# Patient Record
Sex: Female | Born: 1970 | Race: Black or African American | Hispanic: No | Marital: Married | Smoking: Never smoker
Health system: Southern US, Community
[De-identification: ages and names within clinical notes are randomized; demographics above are authoritative.]

## PROBLEM LIST (undated history)

## (undated) DIAGNOSIS — R0989 Other specified symptoms and signs involving the circulatory and respiratory systems: Secondary | ICD-10-CM

## (undated) DIAGNOSIS — J45909 Unspecified asthma, uncomplicated: Secondary | ICD-10-CM

## (undated) DIAGNOSIS — Z803 Family history of malignant neoplasm of breast: Secondary | ICD-10-CM

## (undated) DIAGNOSIS — I639 Cerebral infarction, unspecified: Secondary | ICD-10-CM

## (undated) DIAGNOSIS — R0689 Other abnormalities of breathing: Secondary | ICD-10-CM

## (undated) HISTORY — DX: Unspecified asthma, uncomplicated: J45.909

## (undated) HISTORY — PX: HERNIA REPAIR: SHX51

## (undated) HISTORY — DX: Other abnormalities of breathing: R06.89

## (undated) HISTORY — DX: Other specified symptoms and signs involving the circulatory and respiratory systems: R09.89

## (undated) HISTORY — PX: COLONOSCOPY: SHX174

## (undated) HISTORY — DX: Family history of malignant neoplasm of breast: Z80.3

---

## 2004-06-17 ENCOUNTER — Emergency Department: Payer: Self-pay | Admitting: Emergency Medicine

## 2004-07-30 ENCOUNTER — Emergency Department: Payer: Self-pay | Admitting: Emergency Medicine

## 2004-12-14 ENCOUNTER — Emergency Department: Payer: Self-pay | Admitting: Emergency Medicine

## 2004-12-31 ENCOUNTER — Emergency Department: Payer: Self-pay | Admitting: Unknown Physician Specialty

## 2005-01-23 ENCOUNTER — Emergency Department: Payer: Self-pay | Admitting: Emergency Medicine

## 2005-02-13 ENCOUNTER — Emergency Department: Payer: Self-pay | Admitting: Emergency Medicine

## 2005-02-15 ENCOUNTER — Ambulatory Visit: Payer: Self-pay | Admitting: Obstetrics & Gynecology

## 2005-03-02 ENCOUNTER — Ambulatory Visit: Payer: Self-pay | Admitting: Obstetrics & Gynecology

## 2005-04-27 ENCOUNTER — Emergency Department: Payer: Self-pay | Admitting: Emergency Medicine

## 2005-04-29 ENCOUNTER — Emergency Department: Payer: Self-pay | Admitting: Internal Medicine

## 2005-10-21 ENCOUNTER — Other Ambulatory Visit: Payer: Self-pay

## 2005-10-21 ENCOUNTER — Ambulatory Visit: Payer: Self-pay | Admitting: Specialist

## 2006-02-26 ENCOUNTER — Emergency Department: Payer: Self-pay | Admitting: Emergency Medicine

## 2006-07-28 ENCOUNTER — Ambulatory Visit: Payer: Self-pay | Admitting: Obstetrics & Gynecology

## 2006-08-17 ENCOUNTER — Ambulatory Visit: Payer: Self-pay | Admitting: Obstetrics & Gynecology

## 2008-11-21 ENCOUNTER — Ambulatory Visit: Payer: Self-pay

## 2008-12-13 ENCOUNTER — Ambulatory Visit: Payer: Self-pay | Admitting: Unknown Physician Specialty

## 2010-09-30 ENCOUNTER — Inpatient Hospital Stay: Payer: Self-pay | Admitting: Internal Medicine

## 2011-03-05 ENCOUNTER — Ambulatory Visit: Payer: Self-pay | Admitting: Specialist

## 2011-08-11 HISTORY — PX: FOOT SURGERY: SHX648

## 2011-08-23 ENCOUNTER — Inpatient Hospital Stay: Payer: Self-pay | Admitting: Internal Medicine

## 2011-08-23 LAB — CBC
HCT: 38.5 % (ref 35.0–47.0)
HGB: 13 g/dL (ref 12.0–16.0)
MCH: 29.8 pg (ref 26.0–34.0)
MCHC: 33.8 g/dL (ref 32.0–36.0)
MCV: 88 fL (ref 80–100)
Platelet: 305 10*3/uL (ref 150–440)
RBC: 4.37 10*6/uL (ref 3.80–5.20)
RDW: 13.9 % (ref 11.5–14.5)
WBC: 23.5 10*3/uL — ABNORMAL HIGH (ref 3.6–11.0)

## 2011-08-23 LAB — COMPREHENSIVE METABOLIC PANEL
Alkaline Phosphatase: 76 U/L (ref 50–136)
BUN: 10 mg/dL (ref 7–18)
Bilirubin,Total: 0.9 mg/dL (ref 0.2–1.0)
Calcium, Total: 8.5 mg/dL (ref 8.5–10.1)
Co2: 27 mmol/L (ref 21–32)
Creatinine: 0.82 mg/dL (ref 0.60–1.30)
EGFR (Non-African Amer.): >60
Potassium: 4 mmol/L (ref 3.5–5.1)
SGOT(AST): 24 U/L (ref 15–37)
SGPT (ALT): 25 U/L

## 2011-08-23 LAB — RAPID INFLUENZA A&B ANTIGENS

## 2011-08-23 LAB — TROPONIN I: Troponin-I: <0.02 ng/mL

## 2011-08-23 LAB — CK TOTAL AND CKMB (NOT AT ARMC): CK-MB: <0.5 ng/mL — ABNORMAL LOW (ref 0.5–3.6)

## 2011-08-24 LAB — BASIC METABOLIC PANEL
Anion Gap: 11 (ref 7–16)
BUN: 9 mg/dL (ref 7–18)
Calcium, Total: 8.9 mg/dL (ref 8.5–10.1)
EGFR (African American): >60
EGFR (Non-African Amer.): >60
Glucose: 122 mg/dL — ABNORMAL HIGH (ref 65–99)
Osmolality: 278 (ref 275–301)

## 2011-08-24 LAB — CBC WITH DIFFERENTIAL/PLATELET
Basophil #: 0 10*3/uL (ref 0.0–0.1)
Eosinophil #: 0 10*3/uL (ref 0.0–0.7)
HCT: 36.7 % (ref 35.0–47.0)
HGB: 12.2 g/dL (ref 12.0–16.0)
Lymphocyte #: 1.3 10*3/uL (ref 1.0–3.6)
MCH: 29.8 pg (ref 26.0–34.0)
MCHC: 33.1 g/dL (ref 32.0–36.0)
MCV: 90 fL (ref 80–100)
Monocyte #: 0.2 10*3/uL (ref 0.0–0.7)
Neutrophil #: 19.6 10*3/uL — ABNORMAL HIGH (ref 1.4–6.5)
RDW: 13.9 % (ref 11.5–14.5)

## 2011-08-24 LAB — TSH: Thyroid Stimulating Horm: 0.372 u[IU]/mL — ABNORMAL LOW

## 2011-08-29 LAB — CULTURE, BLOOD (SINGLE)

## 2012-09-13 ENCOUNTER — Inpatient Hospital Stay: Payer: Self-pay | Admitting: Internal Medicine

## 2012-09-13 LAB — CBC WITH DIFFERENTIAL/PLATELET
Basophil #: 0 10*3/uL (ref 0.0–0.1)
Eosinophil #: 0 10*3/uL (ref 0.0–0.7)
HCT: 39.9 % (ref 35.0–47.0)
HGB: 13.5 g/dL (ref 12.0–16.0)
Lymphocyte #: 0.2 10*3/uL — ABNORMAL LOW (ref 1.0–3.6)
Lymphocyte %: 1.9 %
MCH: 29.7 pg (ref 26.0–34.0)
MCHC: 33.8 g/dL (ref 32.0–36.0)
MCV: 88 fL (ref 80–100)
Neutrophil #: 11.9 10*3/uL — ABNORMAL HIGH (ref 1.4–6.5)
Neutrophil %: 96 %
Platelet: 378 10*3/uL (ref 150–440)
WBC: 12.4 10*3/uL — ABNORMAL HIGH (ref 3.6–11.0)

## 2012-09-13 LAB — COMPREHENSIVE METABOLIC PANEL
BUN: 7 mg/dL (ref 7–18)
Co2: 27 mmol/L (ref 21–32)
EGFR (African American): >60
Glucose: 139 mg/dL — ABNORMAL HIGH (ref 65–99)
Osmolality: 274 (ref 275–301)
Total Protein: 7.9 g/dL (ref 6.4–8.2)

## 2012-09-14 LAB — CBC WITH DIFFERENTIAL/PLATELET
Basophil #: 0 10*3/uL (ref 0.0–0.1)
Basophil %: 0 %
Eosinophil #: 0 10*3/uL (ref 0.0–0.7)
Eosinophil %: 0 %
HCT: 38.8 % (ref 35.0–47.0)
HGB: 13 g/dL (ref 12.0–16.0)
Lymphocyte #: 1.4 10*3/uL (ref 1.0–3.6)
MCV: 88 fL (ref 80–100)
Monocyte %: 6.2 %
Neutrophil #: 10.6 10*3/uL — ABNORMAL HIGH (ref 1.4–6.5)
Neutrophil %: 83.1 %
RDW: 13.9 % (ref 11.5–14.5)
WBC: 12.7 10*3/uL — ABNORMAL HIGH (ref 3.6–11.0)

## 2012-09-14 LAB — BASIC METABOLIC PANEL
Anion Gap: 6 — ABNORMAL LOW (ref 7–16)
Chloride: 105 mmol/L (ref 98–107)
Co2: 27 mmol/L (ref 21–32)
Creatinine: 0.76 mg/dL (ref 0.60–1.30)
EGFR (African American): >60
EGFR (Non-African Amer.): >60
Glucose: 103 mg/dL — ABNORMAL HIGH (ref 65–99)
Osmolality: 275 (ref 275–301)
Sodium: 138 mmol/L (ref 136–145)

## 2013-06-23 ENCOUNTER — Encounter: Payer: Self-pay | Admitting: Podiatry

## 2013-07-26 ENCOUNTER — Telehealth: Payer: Self-pay | Admitting: *Deleted

## 2013-07-26 NOTE — Telephone Encounter (Signed)
Called and left [name] a message to call us back regarding her surgery day, per dr Charlsie Merles he asked if we could reschedule her date from 12.30.14 to 12.31.14

## 2013-07-28 ENCOUNTER — Encounter: Payer: Self-pay | Admitting: Podiatry

## 2013-07-28 ENCOUNTER — Ambulatory Visit (INDEPENDENT_AMBULATORY_CARE_PROVIDER_SITE_OTHER): Payer: BC Managed Care – PPO | Admitting: Podiatry

## 2013-07-28 VITALS — BP 118/74 | HR 80 | Resp 16

## 2013-07-28 DIAGNOSIS — M2010 Hallux valgus (acquired), unspecified foot: Secondary | ICD-10-CM

## 2013-07-28 DIAGNOSIS — M2040 Other hammer toe(s) (acquired), unspecified foot: Secondary | ICD-10-CM

## 2013-07-28 NOTE — Progress Notes (Signed)
Subjective:     Patient ID: [name], female   DOB: 1971-07-14, 42 y.o.   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 Scheduled for surgery the end of this month

## 2013-07-28 NOTE — Progress Notes (Signed)
   Subjective:    Patient ID: [name], female    DOB: 1971-05-18, 42 y.o.   MRN: [id number]  HPI Comments: i have some questions berfore surgery      Review of Systems     Objective:   Physical Exam        Assessment & Plan:

## 2013-08-09 ENCOUNTER — Encounter: Payer: Self-pay | Admitting: Podiatry

## 2013-08-09 DIAGNOSIS — M2040 Other hammer toe(s) (acquired), unspecified foot: Secondary | ICD-10-CM

## 2013-08-09 DIAGNOSIS — M2030 Hallux varus (acquired), unspecified foot: Secondary | ICD-10-CM

## 2013-08-09 DIAGNOSIS — M2010 Hallux valgus (acquired), unspecified foot: Secondary | ICD-10-CM

## 2013-08-11 ENCOUNTER — Telehealth: Payer: Self-pay | Admitting: *Deleted

## 2013-08-11 MED ORDER — ONDANSETRON HCL 8 MG PO TABS: 8.0000 mg | ORAL_TABLET | Freq: Four times a day (QID) | ORAL | Status: DC | PRN

## 2013-08-11 NOTE — Telephone Encounter (Signed)
PT CALLED SAID SHE IS TAKING PERCOCET ONLY AND EVERY TIME SHE TAKES IT  WITH FOOD WITHIN  15-20 MINUTES IT WILL  MAKE HER VOMIT. PT STATED THAT SHE TOLD DR. REGAL SHE PREFERRED VICODIN BUT HE SAID THE PERCOCET WILL WORK BETTER. DO YOU WANT TO GIVE HER SOMETHING FOR NAUSEA TO GO ALONG WITH PERCOCET OR CHANGE RX TO VICODIN? PTS SURGERY WAS 12.31.14 [place] BUNIONECTOMY LEFT. ALLERGIC TO ADVIL.

## 2013-08-11 NOTE — Telephone Encounter (Signed)
Spoke with pt said she is taking 1/2 tablet of percocet for pain every 2-3 hours for pain. Stated that when she eats chicken broth soup and drinks ginger ale she can keep it down with out vomiting. Dr. Valentina Lucks said she could have vicodin and stop percocet and have zofran. Told pt vicodin could not be called in would have to pick it up. Said since she can keep soup down she will take the zofran and if she had to she would pick up rx for vicodin on Monday. Told pt to elevate, stay off of foot, ice and try to take percocet every 4hrs at the least. Pt states ok.

## 2013-08-15 ENCOUNTER — Ambulatory Visit (INDEPENDENT_AMBULATORY_CARE_PROVIDER_SITE_OTHER): Payer: BC Managed Care – PPO | Admitting: Podiatry

## 2013-08-15 ENCOUNTER — Ambulatory Visit (INDEPENDENT_AMBULATORY_CARE_PROVIDER_SITE_OTHER): Payer: BC Managed Care – PPO

## 2013-08-15 ENCOUNTER — Encounter: Payer: BC Managed Care – PPO | Admitting: Podiatry

## 2013-08-15 ENCOUNTER — Encounter: Payer: Self-pay | Admitting: Podiatry

## 2013-08-15 VITALS — BP 103/70 | HR 83 | Resp 16 | Ht 62.0 in | Wt 260.0 lb

## 2013-08-15 DIAGNOSIS — Z9889 Other specified postprocedural states: Secondary | ICD-10-CM

## 2013-08-15 DIAGNOSIS — R609 Edema, unspecified: Secondary | ICD-10-CM

## 2013-08-15 DIAGNOSIS — M2010 Hallux valgus (acquired), unspecified foot: Secondary | ICD-10-CM

## 2013-08-15 NOTE — Progress Notes (Signed)
Subjective:     Patient ID: [name], female   DOB: 07/13/1971, 43 y.o.   MRN: [id number]  HPI patient states that she is doing well on her left foot 7 days post surgery and is walking with minimal discomfort   Review of Systems     Objective:   Physical Exam Neurovascular status intact with well-healing surgical sites and good structural alignment of the first MPJ. No indications of drainage or erythema with mild edema consistent with this. Postop and negative Homans sign noted    Assessment:     Doing well post forefoot reconstruction left    Plan:     H&P and x-rays reviewed. Reapplied sterile dressing and instructed on continued boot usage with gradual surgical shoe usage at home reappoint 2 weeks for suture removal and less she needs to be seen earlier for any reason

## 2013-08-29 ENCOUNTER — Ambulatory Visit (INDEPENDENT_AMBULATORY_CARE_PROVIDER_SITE_OTHER): Payer: BC Managed Care – PPO | Admitting: Podiatry

## 2013-08-29 ENCOUNTER — Ambulatory Visit (INDEPENDENT_AMBULATORY_CARE_PROVIDER_SITE_OTHER): Payer: BC Managed Care – PPO

## 2013-08-29 ENCOUNTER — Encounter: Payer: Self-pay | Admitting: Podiatry

## 2013-08-29 VITALS — BP 97/60 | HR 82 | Resp 16 | Ht 61.0 in | Wt 260.0 lb

## 2013-08-29 DIAGNOSIS — M2010 Hallux valgus (acquired), unspecified foot: Secondary | ICD-10-CM

## 2013-08-29 DIAGNOSIS — Z9889 Other specified postprocedural states: Secondary | ICD-10-CM

## 2013-08-29 DIAGNOSIS — M2040 Other hammer toe(s) (acquired), unspecified foot: Secondary | ICD-10-CM

## 2013-08-29 NOTE — Progress Notes (Signed)
Subjective:     Patient ID: [name], female   DOB: 07-19-1971, 43 y.o.   MRN: [id number]  HPI patient states the corn on her fourth toe is bothering her but overall her feet are doing pretty good after surgery. She is one month after having extensive forefoot surgery left   Review of Systems     Objective:   Physical Exam Neurovascular status intact with well-healing surgical sites of the left forefoot first MPJ hallux and fourth toe with keratotic lesion fourth toe and underneath the left foot    Assessment:     Patient is progressing well and satisfied with progress we have at this time    Plan:     At this time debrided lesion and reviewed x-rays. Dispensed anklet with instructions on usage and affixed to tight on her lower ankle she will remove it and begin wearing tennis shoes with improvement of the first MPJ range of motion and port and for her to work on. Reappoint 4 weeks

## 2013-09-15 NOTE — Progress Notes (Signed)
1. [place] bunionectomy (cutting and moving bone) left 2. [place] osteotomy with wire (possible) left 3. Hammer toe repair with removal bone 4th left  Rx: Percocet 10/325 #50 0 refills one to two by mouth every 4 - 6 hrs as needed for pain

## 2013-09-26 ENCOUNTER — Ambulatory Visit (INDEPENDENT_AMBULATORY_CARE_PROVIDER_SITE_OTHER): Payer: BC Managed Care – PPO

## 2013-09-26 ENCOUNTER — Ambulatory Visit (INDEPENDENT_AMBULATORY_CARE_PROVIDER_SITE_OTHER): Payer: BC Managed Care – PPO | Admitting: Podiatry

## 2013-09-26 VITALS — BP 112/62 | HR 85 | Resp 16 | Ht 61.0 in | Wt 264.0 lb

## 2013-09-26 DIAGNOSIS — Z9889 Other specified postprocedural states: Secondary | ICD-10-CM

## 2013-09-26 DIAGNOSIS — M2010 Hallux valgus (acquired), unspecified foot: Secondary | ICD-10-CM

## 2013-09-26 DIAGNOSIS — M2040 Other hammer toe(s) (acquired), unspecified foot: Secondary | ICD-10-CM

## 2013-09-26 MED ORDER — HYDROCODONE-ACETAMINOPHEN 10-325 MG PO TABS: 1.0000 | ORAL_TABLET | Freq: Three times a day (TID) | ORAL | Status: DC | PRN

## 2013-09-26 NOTE — Progress Notes (Signed)
Subjective:     Patient ID: [name], female   DOB: 1971/03/25, 43 y.o.   MRN: [id number]  HPI patient states I'm doing pretty well but I still gets some swelling at the end of the day and I still gets some pain in my foot at night   Review of Systems     Objective:   Physical Exam Neurovascular status intact with patient well oriented x3 and well-healing surgical sites left foot with mild edema noted consistent with the. She is postop    Assessment:     Doing well but still having mild persistent swelling which is normal for the amount of work she had gone    Plan:     Explained elevation and compression with gradual return to normal shoes and at this time patient is allowed to return to work in the next several weeks and will be seen back by me in 8 weeks to recheck earlier if any issues should occur

## 2013-10-06 ENCOUNTER — Encounter: Payer: Self-pay | Admitting: Podiatry

## 2013-11-28 ENCOUNTER — Ambulatory Visit (INDEPENDENT_AMBULATORY_CARE_PROVIDER_SITE_OTHER): Payer: BC Managed Care – PPO

## 2013-11-28 ENCOUNTER — Encounter: Payer: Self-pay | Admitting: Podiatry

## 2013-11-28 ENCOUNTER — Ambulatory Visit (INDEPENDENT_AMBULATORY_CARE_PROVIDER_SITE_OTHER): Payer: BC Managed Care – PPO | Admitting: Podiatry

## 2013-11-28 VITALS — BP 110/60 | HR 80 | Resp 12

## 2013-11-28 DIAGNOSIS — Z9889 Other specified postprocedural states: Secondary | ICD-10-CM

## 2013-11-28 DIAGNOSIS — M2010 Hallux valgus (acquired), unspecified foot: Secondary | ICD-10-CM

## 2013-11-28 NOTE — Progress Notes (Signed)
Subjective:     Patient ID: [name], female   DOB: 30-Jun-1971, 43 y.o.   MRN: [id number]  HPI patient states she is doing well with some occasional discomfort at the end of the day and swelling which can occur at the end of the day   Review of Systems     Objective:   Physical Exam  Neurovascular status intact negative Homan sign was noted with well-healing surgical site first metatarsal left with good alignment of the hallux and first metatarsal    Assessment:     Doing well from foot surgery left with normal amount of edema at this point    Plan:     Reviewed x-rays and advised the swelling should gradually get better but may take another 2-4 months to completely go away. Reappoint to recheck

## 2014-01-15 DIAGNOSIS — J45909 Unspecified asthma, uncomplicated: Secondary | ICD-10-CM | POA: Insufficient documentation

## 2014-09-11 ENCOUNTER — Emergency Department: Payer: Self-pay | Admitting: Emergency Medicine

## 2014-09-11 LAB — CBC WITH DIFFERENTIAL/PLATELET
BASOS ABS: 0.1 10*3/uL (ref 0.0–0.1)
Basophil %: 0.3 %
Eosinophil #: 0.1 10*3/uL (ref 0.0–0.7)
Eosinophil %: 0.5 %
HCT: 40.7 % (ref 35.0–47.0)
HGB: 13.5 g/dL (ref 12.0–16.0)
LYMPHS ABS: 0.6 10*3/uL — AB (ref 1.0–3.6)
LYMPHS PCT: 3.3 %
MCH: 29.1 pg (ref 26.0–34.0)
MCHC: 33.2 g/dL (ref 32.0–36.0)
MCV: 88 fL (ref 80–100)
MONOS PCT: 3 %
Monocyte #: 0.5 x10 3/mm (ref 0.2–0.9)
Neutrophil #: 16.4 10*3/uL — ABNORMAL HIGH (ref 1.4–6.5)
Neutrophil %: 92.9 %
Platelet: 328 10*3/uL (ref 150–440)
RBC: 4.63 10*6/uL (ref 3.80–5.20)
RDW: 14.6 % — ABNORMAL HIGH (ref 11.5–14.5)
WBC: 17.7 10*3/uL — ABNORMAL HIGH (ref 3.6–11.0)

## 2014-09-11 LAB — URINALYSIS, COMPLETE
Bilirubin,UR: NEGATIVE
Glucose,UR: NEGATIVE mg/dL (ref 0–75)
Ketone: NEGATIVE
LEUKOCYTE ESTERASE: NEGATIVE
NITRITE: NEGATIVE
Ph: 9 (ref 4.5–8.0)
RBC,UR: 746 /HPF (ref 0–5)
SPECIFIC GRAVITY: 1.025 (ref 1.003–1.030)
Squamous Epithelial: 3

## 2014-09-11 LAB — COMPREHENSIVE METABOLIC PANEL
ALT: 18 U/L (ref 14–63)
ANION GAP: 8 (ref 7–16)
Albumin: 3.1 g/dL — ABNORMAL LOW (ref 3.4–5.0)
Alkaline Phosphatase: 71 U/L (ref 46–116)
BUN: 14 mg/dL (ref 7–18)
Bilirubin,Total: 0.5 mg/dL (ref 0.2–1.0)
CHLORIDE: 105 mmol/L (ref 98–107)
CREATININE: 0.82 mg/dL (ref 0.60–1.30)
Calcium, Total: 8.6 mg/dL (ref 8.5–10.1)
Co2: 27 mmol/L (ref 21–32)
EGFR (Non-African Amer.): >60
GLUCOSE: 126 mg/dL — AB (ref 65–99)
Osmolality: 281 (ref 275–301)
POTASSIUM: 3.5 mmol/L (ref 3.5–5.1)
SGOT(AST): 22 U/L (ref 15–37)
Sodium: 140 mmol/L (ref 136–145)
Total Protein: 7 g/dL (ref 6.4–8.2)

## 2014-09-11 LAB — TROPONIN I

## 2014-09-11 LAB — LIPASE, BLOOD: Lipase: 120 U/L (ref 73–393)

## 2014-11-30 NOTE — H&P (Signed)
PATIENT NAME:  [name], [name] MR#:  [id number] DATE OF BIRTH:  [birth year]  DATE OF ADMISSION:  09/13/2012  PULMONOLOGIST: Melody Haver E. Raul Del, MD   REFERRING PHYSICIAN: Angus Owens Shark, MD  CHIEF COMPLAINT: Shortness of breath.   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 The patient is reporting that she has been coughing and bringing up yellowish phlegm. She was seen by her pulmonologist, Dr. Raul Del, prior to Friday, and she was started on the prednisone, and prescription was given for the levofloxacin as she responds well to levofloxacin. Unfortunately, as her insurance is not covering levofloxacin, that antibiotic was changed to amoxicillin by Dr. Raul Del. The patient did not improve at all clinically, and her shortness of breath with productive cough has been getting worse. As she was feeling much sicker, she came to the ER. In the ER, chest x-ray has revealed no acute infiltrates. ABG has revealed pH of 7.45 with pCO2 37 and pO2 59. The patient has received Solu-Medrol 125 mg IV and levofloxacin. Urine pregnancy test was negative. She was given some breathing treatments, and eventually during my examination, the patient started feeling much better. She denies any sick contacts, contact with patients with flu. Denies any chest pain. Denies any other complaints. Hospitalist team is called to admit the patient.   PAST MEDICAL HISTORY: Chronic history of asthma, seasonal allergies, peptic ulcer disease, history of left lower extremity DVT, chronic headaches, sleep apnea.   PAST SURGICAL HISTORY: Hernia repair. The patient denies any thyroid surgery in the past, but  that was documented in January 2013 medical records. The patient is reporting that she never had any thyroid problems.   ALLERGIES: SHE IS ALLERGIC TO ADVIL, ASPIRIN, IBUPROFEN, MOTRIN.   HOME MEDICATIONS: Symbicort 2 inhalations twice a day, Singulair 10 mg once daily, prednisone tampering dose, levalbuterol 0.5 mg inhalation every 6 hours, amoxicillin 250 mg 1 capsule 3 times a day, Advair HFA inhalations twice a day.   PSYCHOSOCIAL HISTORY: Lives at home with her 43 year old daughter. Denies any history of smoking, alcohol or illicit drug usage.   FAMILY HISTORY: Her mother has history of diabetes mellitus.   REVIEW OF SYSTEMS:  CONSTITUTIONAL: Complaining of fatigue, but denies any weight loss or weight gain.  EYES: Denies blurry vision, glaucoma, cataracts.  ENT: Denies epistaxis, nasal discharge or difficulty in swallowing.  RESPIRATORY: Complaining of productive cough. Chronic history of asthma. Complaining of dyspnea. Denies any pneumonia, painful respirations.  CARDIOVASCULAR: Denies chest pain, palpitations, syncope. GASTROINTESTINAL: Denies nausea, vomiting, diarrhea, abdominal pain, GERD, but the patient has history of peptic ulcer disease.  GENITOURINARY: Denies dysuria, hematuria. GYNECOLOGIC AND BREASTS: Denies breast mass, vaginal discharge.  ENDOCRINE: Denies polyuria, polyphagia, polydipsia. Denies any thyroid problems. HEMATOLOGIC AND LYMPHATIC: No history of anemia or bleeding.  INTEGUMENTARY: Denies acne, rash, lesions.  MUSCULOSKELETAL: Denies any neck pain, back pain, shoulder pain, redness, gout.  NEUROLOGIC: No vertigo, ataxia, dementia, CVA, TIA.  PSYCH: Denies any insomnia, ADD, OCD.   PHYSICAL EXAMINATION:  VITAL SIGNS: Pulse 82, respirations 22, blood pressure 124/64, satting 96%. Temperature is not recorded.  GENERAL APPEARANCE: Not under acute distress. Obese, moderate built.  HEENT: Normocephalic, atraumatic. Pupils are  equally reacting to light and  accommodation. No conjunctival injection. No scleral icterus. Positive nasal discharge, positive postnasal drip. No sinus tenderness.  NECK: Supple. No JVD. No thyromegaly.  LUNGS: Moderate air entry. End-expiratory wheezing. No accessory muscle usage. No crackles. No rhonchi.  CARDIAC: S1, S2 normal. Regular rate and rhythm. No murmurs.  GASTROINTESTINAL: Soft, obese. Bowel sounds are positive in all 4 quadrants. Nontender, nondistended. No masses.  NEUROLOGIC: Awake, alert and oriented x3. Motor and sensory are grossly intact. Reflexes are 2+.  SKIN: Warm to touch, dry. No lesions. No rashes.  EXTREMITIES: No edema. No cyanosis. No clubbing. PSYCHIATRIC: Normal mood and affect.  MUSCULOSKELETAL: No joint effusion. No tenderness. No erythema.   DIAGNOSTIC STUDIES: Chest x-ray: No acute findings. CBC and CMP are ordered, which are pending. Urine pregnancy test is pending at this time as well. ABG has revealed pH of 7.45, pCO2 37, pO2 59 on 21% FiO2, bicarb is 25.3, pulse oximetry 92.4%. The patient's temperature is 37 degrees Fahrenheit on ABG.   ASSESSMENT AND PLAN: A 44 year old African-American female presenting to the Emergency Room with a chief complaint of shortness of breath and productive cough for the past 3 to 4 days and failed on outpatient antibiotic, amoxicillin. Will be admitted with the following assessment and plan:   1. Shortness of breath from acute asthma exacerbation and acute bronchitis.  Plan: Will admit her to inpatient status.  Solu-Medrol 125 mg IV x1 was given in the ER. Will continue 60 mg IV q.6 hours.  DuoNeb treatments q.6 hours.  Sputum culture and sensitivities ordered.  Levofloxacin 750 mg IV q.24 hours.  Followup on the labs which are still pending.  2. History of sleep apnea. The patient is not on any CPAP machine.  3. History of peptic ulcer disease. Will provide her GI prophylaxis with ranitidine 150 mg twice a day.  4. Will continue Singulair.  5.  History of lower extremity deep vein thrombosis. The patient denies any calf tenderness at this time and reported that she has finished the course of her treatment for deep vein thrombosis.  6. CODE STATUS: She is full code. 7. She is ambulatory, so she does not need any deep vein thrombosis prophylaxis at this time.   TOTAL TIME SPENT ON ADMISSION: 45 minutes.  ____________________________ Nicholes Mango, MD ag:OSi D: 09/13/2012 03:55:32 ET T: 09/13/2012 11:40:57 ET JOB#: [id number]  cc: Nicholes Mango, MD, <Dictator> Herbon E. Raul Del, MD Nicholes Mango MD ELECTRONICALLY SIGNED 09/14/2012 4:55

## 2014-11-30 NOTE — Discharge Summary (Signed)
PATIENT NAME:  [name], SPIEGELMAN MR#:  [id number] DATE OF BIRTH:  [birth year]  DATE OF ADMISSION:  09/13/2012 DATE OF DISCHARGE:  09/14/2012  ADMITTING DIAGNOSIS:  Shortness of breath.   DISCHARGE DIAGNOSES: 1.  Shortness of breath due to combination of acute asthma exacerbation as well as community-acquired pneumonia.  The patient's symptoms improved, seen by her pulmonologist, okay for discharge.  2.  Seasonal allergies.  3.  Peptic ulcer disease.  4.  History of left lower extremity deep vein thrombosis in the past, status post treatment.  5.  Chronic headaches.  6.  Sleep apnea, intolerant to CPAP machine.  7.  Status post hernia repair.   PERTINENT LABORATORY AND EVALUATIONS:  EKG on admission showed normal sinus rhythm with nonspecific ST changes.  ABG, pH 7.45, pCO2 of 37, pO2 of 59.  Her chest x-ray, PA and lateral, showed an infiltrate in the left lower lobe posteriorly, perihilar subsegmental atelectasis bilaterally, mild hyperinflation consistent with reactive airway disease.    CONSULTANTS:  Dr. Raul Del.   HOSPITAL COURSE:  Please refer to history and physical done by the admitting physician.  The patient is a 44 year old African American female with history of asthma, mild obstructive sleep apnea, unable to tolerate CPAP, seasonal allergies, peptic ulcer disease, history of lower extremity DVT who presented with complaint of shortness of breath for the past 3 to 4 days.  The patient was seen by her pulmonologist, Dr. Raul Del on Friday, started on prednisone and given prescription for levofloxacin, however insurance did not cover levofloxacin.  She was changed to amoxicillin.  She continued to have shortness of breath and felt much sicker so came to the ED.  In the ER she was noted to have a PaO2 of 59.  The patient was given IV Solu-Medrol and nebulizer treatments and IV antibiotics.  She was continued on these treatments.  She is feeling much better.  She was seen by Dr. Raul Del who  stated that she is stable for discharge.  At this point, she is stable for discharge.   DISCHARGE MEDICATIONS:  Singulair 10 daily, Tussionex 5 mL q. 12, levalbuterol q. 4 as needed, Advair inhalation 250/50 INH q. 12, Symbicort inhalation as doing before, prednisone taper 60 mg, taper by 10 mg until complete, Ceftin 500 by mouth twice daily x 5 days, azithromycin 500 mg 1 tab by mouth daily x 5 days.   HOME OXYGEN:  None.   DIET:  Regular.   ACTIVITY:  As tolerated.   DISCHARGE FOLLOW-UP:  Follow with Dr. Raul Del in 1 to 2 weeks.   TIME SPENT:  35 minutes spent on the discharge.      ____________________________ Lafonda Mosses. Posey Pronto, MD shp:ea D: 09/14/2012 15:19:26 ET T: 09/14/2012 23:12:09 ET JOB#: [id number]  cc: Aziza Stuckert H. Posey Pronto, MD, <Dictator> Alric Seton MD ELECTRONICALLY SIGNED 09/20/2012 10:14

## 2014-12-02 NOTE — Consult Note (Signed)
PATIENT NAME:  [name], [name] MR#:  [id number] DATE OF BIRTH:  1970/10/23  DATE OF CONSULTATION:  08/24/2011  REFERRING PHYSICIAN:  Fulton Reek, MD CONSULTING PHYSICIAN:  Channon Brougher E. Raul Del, MD   CHIEF COMPLAINT: Impending respiratory failure/asthma exacerbation.   HISTORY OF PRESENT ILLNESS:  [name] is a 44 year old African American lady who is well known to me. We have been seeing her for asthma. In fact, within the last week or two she was having a bout of asthmatic flare.  She did not come into the office but we did order her a six-day prednisone tapering dose, also encouraged her to stay on her usual medications. 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 She does not smoke. She has not been exposed to obvious noxious inhalants, nor does she have any significant postnasal drip or  gastroesophageal reflux disease. She presented to the Emergency Room complaining of worsening shortness of breath associated with wheezing and cough. She was tachycardic on arrival to the Emergency Room. Temperature was 102. She was placed on oxygen, given IV steroids as well as nebulizer treatments. Did not respond and hence was admitted to the hospital. Her work-up thus far showed on initial chest x-ray now opacities in the right lower lung concerning for atelectasis although pneumonia could not be ruled out. Mild prominence of the right hilum was also seen. There were also atelectatic or infiltrative changes in the left upper lobe. Repeat chest x-ray today showed that this had all cleared up. Influenza A and B screen was negative. Blood cultures thus far are negative. Initial white count on arrival was 23.5, today is down to 21.2. She seemed to think that she was feeling a little better today, was still somewhat tight and short of breath with minimal activity. Again, no chronic pain, also has no chronic leg edema. No hemoptysis. No pleurisy.   PAST MEDICAL HISTORY:  1. Asthma.  2. History of rhinitis.   3. Gastroesophageal reflux disease.  4. Status post left lower extremity deep vein thrombosis.  5. Chronic headaches.  6. Mild sleep apnea.  7. Status post hernia repair. 8. Thyroid surgery.   ALLERGIES: Aspirin, ibuprofen.  ADMISSION MEDICATIONS: 1. Advair 500/50, 1 puff b.i.d.  2. Mucinex 600 mg b.i.d.  3. Singulair 10 mg daily.  4. Zyrtec 10 mg daily.  5. Ventolin 2 puffs every four hours p.r.n.   SOCIAL HISTORY:  She does not drink or smoke. Does have children. Here with her brother.   FAMILY HISTORY: Noted for hypertension and diabetes.   REVIEW OF SYSTEMS:  She did have a fever when she came in but that seems to have resolved. There is no associated weight loss.  Aware of the fact that she does need to lose weight. No chills, sweats, or passing out spells, nor does she have any double vision, ringing in the ears, sore throat, change in voice, nosebleeds, or significant postnasal drip. Also no neck pain, stiffness, or parotid gland enlargement. Respiratory as per above. Cardiac- no chest pain, palpitations. syncope. No nausea, vomiting, diarrhea, blood in stool, dysuria, or flank pain, polyphagia, polydipsia, heat or cold intolerance nor is she having any rashes, nonhealing ulcers, lymph nodes, bleeding from any site, halo, photosensitivity gout, joint effusions, or tenderness. No strokes, seizures, passing out spells, numbness, migraines nor is she having any suicidal ideation. No depression or anxiety. The rest of her review was noncontributory.   PHYSICAL EXAMINATION:  VITAL SIGNS: Temperature 97.6, pulse 66, respirations 17,  blood pressure 100/64, oxygen saturation 99% on room air.   GENERAL: Pleasant lady sitting in a chair. Brother is at her side. No signs of impending respiratory distress. No audible wheezing, nor is she having any use of accessory muscles.   HEENT: Normocephalic, nontraumatic. Extraocular muscles are intact. Sclerae clear. No nystagmus. Oropharynx slightly  narrowed.   NECK: Short. No jugular venous distention. No thyromegaly. Trachea midline.   LUNGS: Bilateral breath sounds somewhat distant. Minimal wheezing today.   CARDIAC: Regular rhythm. No ectopy, gallops.   ABDOMEN: Obese.   EXTREMITIES: Large legs with trace edema. No Homans sign.   SKIN: Fair turgor. No rashes. No healing ulcers.   LYMPH: No nodes in neck or supraclavicular area.   NEUROLOGIC: Did not ambulate but able to raise all extremities against gravity.   PSYCH: Appropriate affect. Alert and oriented.  LABORATORY DATA:  Today glucose 122, BUN 9, creatinine 0.69, sodium 139, potassium 3.8, chloride 104, CO2 24, calcium 11. TSH was 0.372. WBC down to 21.2, hemoglobin 12.2, hematocrit 36.5.   IMPRESSION: This is a pleasant 44 year old African American lady who is obese with mild sleep apnea. She has not been assessed recently; hence, we need to reevaluate for sleep apnea when she gets over this acute event and returns to the office. However, the more acute issue is the presentation of an asthmatic flare with atelectasis that is now resolved on today's chest x-ray. Tachycardia is now improved.  I don't have a  strong history here to suggest that she had a pulmonary embolism. She is on deep vein thrombosis prophylaxis. She has had left deep venous thrombosis in the past, however. I believe that the asthmatic flare is her primary problem. No strong history to suggest pneumonia. I do not think she had a pulmonary embolism.    RECOMMENDATIONS:  1. Continue albuterol nebulizer. 2. Steroids. 3. Deep vein thrombosis prophylaxis. 4. Increase ambulation today.  5. Anticipate possibly going home tomorrow. 6. We will check an Ig and eosinophil count to make sure she is not developing allergic bronchopulmonary aspergillosis since she has been having recurrent flares of asthma recently.      We will reassess today. I will follow with you.    ____________________________ Freda Munro.  Raul Del, MD hef:bjt D: 08/24/2011 11:32:15 ET T: 08/24/2011 12:30:19 ET JOB#: [id number]  cc: Julea Hutto E. Raul Del, MD, <Dictator> Erby Pian MD ELECTRONICALLY SIGNED 08/25/2011 13:22

## 2014-12-02 NOTE — Discharge Summary (Signed)
PATIENT NAME:  [name], [name] MR#:  [id number] DATE OF BIRTH:  [birth year]  DATE OF ADMISSION:  08/23/2011 DATE OF DISCHARGE:  08/25/2011  DISCHARGE DIAGNOSES:  1. Acute respiratory failure due to acute asthma exacerbation.  2. Acute bronchitis, tachycardia, and allergy.  3. History of left lower extremity deep vein thrombosis.  4. Leukocytosis.  5. History of peptic ulcer disease.  6. Chronic headaches.  7. Mild sleep apnea.  8. History of prior thyroid surgery.   DISPOSITION: The patient is being discharged home.   FOLLOWUP: Follow up with Dr. Raul Del in one week after discharge.   DIET: Low sodium 1800 calorie ADA diet.   ACTIVITY: As tolerated.   DISCHARGE MEDICATIONS:  1. Advair 500/50, 1 puff b.i.d.  2. Levaquin 500 mg daily for 5 days.  3. Singulair 10 mg daily.  4. Cetirizine  10 mg at bedtime.  5. Tussionex 5 mL b.i.d. p.r.n. cough. 6. Xopenex/ipratropium nebulizers every 4 hours p.r.n.    7. Prednisone taper as prescribed.   LABORATORY, DIAGNOSTIC AND RADIOLOGICAL DATA:  Initial chest x-ray showed mild opacities in the right lower lung, atelectasis versus infection. Findings most consistent with partial atelectasis of the left upper lobe. Repeat chest x-ray done the next day showed no acute disease of the chest.  Microbiology: Blood cultures showed no growth in 36 hours.  Normal hemoglobin and platelet count, white count 22.5 to 21.2.  Normal complete metabolic panel.  Normal cardiac enzymes.   HOSPITAL COURSE: The patient is a 44 year old female with past medical history of asthma, mild, sleep apnea, obesity, who presented with cough and congestion. She was found to have acute respiratory failure due to acute asthma exacerbation. She was started on nebulizer treatments, Advair, empiric antibiotics, and steroids with significant improvement. She also complained of cough and expectoration. An initial chest x-ray showed some abnormalities, but a repeat chest x-ray showed  no abnormalities. She was treated with empiric Levaquin with improvement in her symptoms. Influenza A and B were negative. Her blood cultures have been negative so far. She also had some tachycardia initially which possibly was due to acute respiratory distress, which resolved. She had leukocytosis, however, she had taken recent steroids as an outpatient. A Pulmonary consultation during the hospitalization was obtained with Dr. Raul Del, who is going to repeat her testing for sleep apnea as an outpatient.   The patient is being discharged home in a stable condition. She will follow up with Dr. Raul Del in one week after discharge.   TIME SPENT: 45 minutes   ____________________________ Cherre Huger, MD sp:cbb D: 08/25/2011 16:33:13 ET T: 08/26/2011 10:16:38 ET JOB#: [id number]  cc: Cherre Huger, MD, <Dictator> Herbon E. Raul Del, MD Cherre Huger MD ELECTRONICALLY SIGNED 08/26/2011 16:26

## 2014-12-02 NOTE — H&P (Signed)
PATIENT NAME:  [name] MR#:  [id number] DATE OF BIRTH:  [birth year]  DATE OF ADMISSION:  08/23/2011  REFERRING PHYSICIAN: Graciella Freer, MD  PRIMARY CARE PHYSICIAN: None.  PULMONOLOGIST: Wallene Huh, MD  REASON FOR ADMISSION: Acute respiratory failure with pneumonia and asthma exacerbation.   HISTORY OF PRESENT ILLNESS: The patient is a 44 year old female with a significant history of asthma and mild sleep apnea who presents to the Emergency Room with a less than one-day history of acute shortness of breath with associated wheezing and cough. In the Emergency Room, the patient was in obvious respiratory distress and was severely tachycardic. She was also febrile to 102. She was initially placed on oxygen in the Emergency Room and given IV steroids with Xopenex and Atrovent SVNs with only minimal improvement of her symptoms. She is now admitted for further evaluation.   PAST MEDICAL HISTORY:  1. Asthma.  2. Environmental allergies.  3. History of peptic ulcer disease.  4. History of left lower extremity deep vein thrombosis.  5. Chronic headaches.  6. Mild sleep apnea.  7. Status post hernia repair.  8. Previous thyroid surgery.   ADMISSION MEDICATIONS:  1. Advair 500/50 one puff twice a day. 2. Mucinex 600 mg p.o. twice a day. 3. Singulair 10 mg p.o. daily.  4. Zyrtec 10 mg p.o. daily.  5. Ventolin 2 puffs four times daily.  ALLERGIES: Aspirin and ibuprofen.   SOCIAL HISTORY: Negative for alcohol or tobacco abuse.   FAMILY HISTORY: Positive for hypertension and diabetes.   REVIEW OF SYSTEMS: CONSTITUTIONAL: Positive fever. No change in weight. EYES: No blurred or double vision. No glaucoma. ENT: No tinnitus or hearing loss. No difficulty swallowing. No nasal discharge. RESPIRATORY: No hemoptysis. No painful respiration. CARDIOVASCULAR: No chest pain or orthopnea. No syncope. GI: No nausea, vomiting, or diarrhea. No abdominal pain. No change in bowel habits. GU: No dysuria  or hematuria. No incontinence. ENDOCRINE: No polyuria or polydipsia. No heat or cold intolerance. HEMATOLOGIC: The patient denies anemia, easy bruising, or bleeding. LYMPHATIC: No swollen glands. MUSCULOSKELETAL: The patient denies pain in her neck, back, shoulders, knees, or hips. No gout. NEUROLOGIC: No numbness or migraines. Denies stroke or seizures. PSYCH: The patient denies anxiety, insomnia, or depression.   PHYSICAL EXAMINATION:   GENERAL: The patient is acutely ill-appearing, in moderate respiratory distress.   VITAL SIGNS: Vital signs are remarkable for a blood pressure of 100/60 with a heart rate of 113 and a respiratory rate of 32 with a temperature of 102.   HEENT: Normocephalic, atraumatic. Pupils are equally round and reactive to light and accommodation. Extraocular movements are intact. Sclerae anicteric. Conjunctivae are clear. Oropharynx is dry but clear.   NECK: Supple without jugular venous distention or bruits. No adenopathy or thyromegaly was noted.   LUNGS: Scattered wheezes and rhonchi bilaterally. There is dullness at the bases. No rales.   HEART: Rapid rate with a regular rhythm. Normal S1 and S2. No significant rubs, murmurs, or gallops. PMI is nondisplaced.   ABDOMEN: Soft and nontender with normoactive bowel sounds. No organomegaly or masses were appreciated. No hernias or bruits were noted.   EXTREMITIES: No clubbing, cyanosis or edema. Pulses were 2+ bilaterally.   SKIN: Warm and dry without rash or lesions.   NEUROLOGIC: Cranial nerves II through XII grossly intact. Deep tendon reflexes were symmetric. Motor and sensory exam is nonfocal.   PSYCH: Exam revealed a patient who was alert and oriented to person, place, and time. She was cooperative and  used good judgment.   LABS/STUDIES: EKG reveals sinus tachycardia with no acute ischemic changes.   Chest x-ray reveals right lower lung opacity consistent with pneumonia.   CBC was remarkable for a white count  of 23.5 with a hemoglobin of 13.0. Chemistries were unremarkable. Flu swab was negative.   ASSESSMENT:  1. Acute respiratory failure.  2. Right lower lobe pneumonia.  3. Asthma exacerbation.  4. Tachycardia.  5. Environmental allergies.  6. History of left lower extremity deep vein thrombosis.   PLAN: The patient will be admitted to the floor with telemetry with IV fluids, IV steroids, IV antibiotics, Xopenex, and Atrovent SVNs. We will continue her Singulair and Advair. We will send sputum for culture. We will supplement oxygen at this time and wean as tolerated. We will follow her heart rate closely while on telemetry. Follow-up chest x-ray in the morning. Follow           up routine labs in the morning. We will consult Dr. Raul Del in regards to her acute respiratory distress/failure. Further treatment and evaluation will depend upon the patient's progress.   TOTAL TIME SPENT: 50 minutes. ____________________________ Leonie Douglas Doy Hutching, MD jds:slb D: 08/23/2011 15:59:14 ET T: 08/23/2011 16:11:09 ET JOB#: [id number]  cc: Leonie Douglas. Doy Hutching, MD, <Dictator> Herbon E. Raul Del, MD Jazyah Butsch Lennice Sites MD ELECTRONICALLY SIGNED 08/24/2011 11:37

## 2016-05-20 ENCOUNTER — Emergency Department
Admission: EM | Admit: 2016-05-20 | Discharge: 2016-05-20 | Disposition: A | Discharge status: Home / Self Care | Payer: No Typology Code available for payment source | Attending: Emergency Medicine | Admitting: Emergency Medicine

## 2016-05-20 ENCOUNTER — Encounter: Payer: Self-pay | Admitting: Emergency Medicine

## 2016-05-20 DIAGNOSIS — Y999 Unspecified external cause status: Secondary | ICD-10-CM | POA: Insufficient documentation

## 2016-05-20 DIAGNOSIS — Y9389 Activity, other specified: Secondary | ICD-10-CM | POA: Insufficient documentation

## 2016-05-20 DIAGNOSIS — S3992XA Unspecified injury of lower back, initial encounter: Secondary | ICD-10-CM | POA: Diagnosis present

## 2016-05-20 DIAGNOSIS — S39012A Strain of muscle, fascia and tendon of lower back, initial encounter: Secondary | ICD-10-CM | POA: Insufficient documentation

## 2016-05-20 DIAGNOSIS — J45909 Unspecified asthma, uncomplicated: Secondary | ICD-10-CM | POA: Insufficient documentation

## 2016-05-20 DIAGNOSIS — Z79899 Other long term (current) drug therapy: Secondary | ICD-10-CM | POA: Insufficient documentation

## 2016-05-20 DIAGNOSIS — S239XXA Sprain of unspecified parts of thorax, initial encounter: Secondary | ICD-10-CM

## 2016-05-20 DIAGNOSIS — Y92511 Restaurant or cafe as the place of occurrence of the external cause: Secondary | ICD-10-CM | POA: Insufficient documentation

## 2016-05-20 MED ORDER — CYCLOBENZAPRINE HCL 5 MG PO TABS: 5.0000 mg | ORAL_TABLET | Freq: Three times a day (TID) | ORAL | 0 refills | Status: DC | PRN

## 2016-05-20 NOTE — Discharge Instructions (Signed)
Take the prescription muscle relaxant along with your daily Aleve for muscle pain and spasms. Follow-up with [facility] for continued symptoms.
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
## 2016-11-20 ENCOUNTER — Ambulatory Visit: Payer: Self-pay | Admitting: Obstetrics & Gynecology

## 2017-01-27 ENCOUNTER — Encounter: Payer: Self-pay | Admitting: Obstetrics & Gynecology

## 2017-01-27 ENCOUNTER — Other Ambulatory Visit: Payer: Self-pay | Admitting: Obstetrics & Gynecology

## 2017-01-27 ENCOUNTER — Ambulatory Visit (INDEPENDENT_AMBULATORY_CARE_PROVIDER_SITE_OTHER): Payer: BLUE CROSS/BLUE SHIELD | Admitting: Obstetrics & Gynecology

## 2017-01-27 VITALS — BP 120/80 | HR 75 | Ht 61.0 in | Wt 278.0 lb

## 2017-01-27 DIAGNOSIS — Z131 Encounter for screening for diabetes mellitus: Secondary | ICD-10-CM | POA: Diagnosis not present

## 2017-01-27 DIAGNOSIS — Z124 Encounter for screening for malignant neoplasm of cervix: Secondary | ICD-10-CM | POA: Diagnosis not present

## 2017-01-27 DIAGNOSIS — Z113 Encounter for screening for infections with a predominantly sexual mode of transmission: Secondary | ICD-10-CM | POA: Diagnosis not present

## 2017-01-27 DIAGNOSIS — Z8639 Personal history of other endocrine, nutritional and metabolic disease: Secondary | ICD-10-CM | POA: Diagnosis not present

## 2017-01-27 DIAGNOSIS — Z1329 Encounter for screening for other suspected endocrine disorder: Secondary | ICD-10-CM

## 2017-01-27 DIAGNOSIS — D259 Leiomyoma of uterus, unspecified: Secondary | ICD-10-CM

## 2017-01-27 DIAGNOSIS — Z01419 Encounter for gynecological examination (general) (routine) without abnormal findings: Secondary | ICD-10-CM | POA: Diagnosis not present

## 2017-01-27 DIAGNOSIS — Z1231 Encounter for screening mammogram for malignant neoplasm of breast: Secondary | ICD-10-CM

## 2017-01-27 DIAGNOSIS — Z0000 Encounter for general adult medical examination without abnormal findings: Secondary | ICD-10-CM

## 2017-01-27 DIAGNOSIS — D219 Benign neoplasm of connective and other soft tissue, unspecified: Secondary | ICD-10-CM | POA: Insufficient documentation

## 2017-01-27 DIAGNOSIS — Z1239 Encounter for other screening for malignant neoplasm of breast: Secondary | ICD-10-CM

## 2017-01-27 MED ORDER — PHENTERMINE HCL 37.5 MG PO TABS: 37.5000 mg | ORAL_TABLET | Freq: Every day | ORAL | 0 refills | Status: DC

## 2017-01-27 NOTE — Patient Instructions (Signed)
PAP every three years Mammogram every year Colonoscopy every 10 years Labs yearly (with PCP)  Phentermine tablets or capsules What is this medicine? PHENTERMINE (FEN ter meen) decreases your appetite. It is used with a reduced calorie diet and exercise to help you lose weight. This medicine may be used for other purposes; ask your health care provider or pharmacist if you have questions. COMMON BRAND NAME(S): Adipex-P, Atti-Plex P, Atti-Plex P Spansule, Fastin, Lomaira, Pro-Fast, Tara-8 What should I tell my health care provider before I take this medicine? They need to know if you have any of these conditions: -agitation -glaucoma -heart disease -high blood pressure -history of substance abuse -lung disease called Primary Pulmonary Hypertension (PPH) -taken an MAOI like Carbex, Eldepryl, Marplan, Nardil, or Parnate in last 14 days -thyroid disease -an unusual or allergic reaction to phentermine, other medicines, foods, dyes, or preservatives -pregnant or trying to get pregnant -breast-feeding How should I use this medicine? Take this medicine by mouth with a glass of water. Follow the directions on the prescription label. The instructions for use may differ based on the product and dose you are taking. Avoid taking this medicine in the evening. It may interfere with sleep. Take your doses at regular intervals. Do not take your medicine more often than directed. Talk to your pediatrician regarding the use of this medicine in children. While this drug may be prescribed for children 17 years or older for selected conditions, precautions do apply. Overdosage: If you think you have taken too much of this medicine contact a poison control center or emergency room at once. NOTE: This medicine is only for you. Do not share this medicine with others. What if I miss a dose? If you miss a dose, take it as soon as you can. If it is almost time for your next dose, take only that dose. Do not take  double or extra doses. What may interact with this medicine? Do not take this medicine with any of the following medications: -duloxetine -MAOIs like Carbex, Eldepryl, Marplan, Nardil, and Parnate -medicines for colds or breathing difficulties like pseudoephedrine or phenylephrine -procarbazine -sibutramine -SSRIs like citalopram, escitalopram, fluoxetine, fluvoxamine, paroxetine, and sertraline -stimulants like dexmethylphenidate, methylphenidate or modafinil -venlafaxine This medicine may also interact with the following medications: -medicines for diabetes This list may not describe all possible interactions. Give your health care provider a list of all the medicines, herbs, non-prescription drugs, or dietary supplements you use. Also tell them if you smoke, drink alcohol, or use illegal drugs. Some items may interact with your medicine. What should I watch for while using this medicine? Notify your physician immediately if you become short of breath while doing your normal activities. Do not take this medicine within 6 hours of bedtime. It can keep you from getting to sleep. Avoid drinks that contain caffeine and try to stick to a regular bedtime every night. This medicine was intended to be used in addition to a healthy diet and exercise. The best results are achieved this way. This medicine is only indicated for short-term use. Eventually your weight loss may level out. At that point, the drug will only help you maintain your new weight. Do not increase or in any way change your dose without consulting your doctor. You may get drowsy or dizzy. Do not drive, use machinery, or do anything that needs mental alertness until you know how this medicine affects you. Do not stand or sit up quickly, especially if you are an older patient. This  reduces the risk of dizzy or fainting spells. Alcohol may increase dizziness and drowsiness. Avoid alcoholic drinks. What side effects may I notice from  receiving this medicine? Side effects that you should report to your doctor or health care professional as soon as possible: -chest pain, palpitations -depression or severe changes in mood -increased blood pressure -irritability -nervousness or restlessness -severe dizziness -shortness of breath -problems urinating -unusual swelling of the legs -vomiting Side effects that usually do not require medical attention (report to your doctor or health care professional if they continue or are bothersome): -blurred vision or other eye problems -changes in sexual ability or desire -constipation or diarrhea -difficulty sleeping -dry mouth or unpleasant taste -headache -nausea This list may not describe all possible side effects. Call your doctor for medical advice about side effects. You may report side effects to FDA at 1-800-FDA-1088. Where should I keep my medicine? Keep out of the reach of children. This medicine can be abused. Keep your medicine in a safe place to protect it from theft. Do not share this medicine with anyone. Selling or giving away this medicine is dangerous and against the law. This medicine may cause accidental overdose and death if taken by other adults, children, or pets. Mix any unused medicine with a substance like cat litter or coffee grounds. Then throw the medicine away in a sealed container like a sealed bag or a coffee can with a lid. Do not use the medicine after the expiration date. Store at room temperature between 20 and 25 degrees C (68 and 77 degrees F). Keep container tightly closed. NOTE: This sheet is a summary. It may not cover all possible information. If you have questions about this medicine, talk to your doctor, pharmacist, or health care provider.  2018 Elsevier/Gold Standard (2015-05-03 12:53:15)

## 2017-01-27 NOTE — Progress Notes (Signed)
HPI:      [name] is a 46 y.o. G2P1011 who LMP was Patient's last menstrual period was 01/05/2017., she presents today for her annual examination. The patient has no complaints today. The patient is sexually active. Her last pap: approximate date 2016 and was normal and last mammogram: approximate date 2016 and was normal. The patient does perform self breast exams.  There is no notable family history of breast or ovarian cancer in her family.  The patient has regular exercise: yes.  The patient denies current symptoms of depression.  Weight gain.  Labs for STD screening desired. H/o fibroids.  GYN History: Contraception: none  PMHx: Past Medical History:  Diagnosis Date  . Asthma   . Difficulty breathing   . Sinus complaint    Past Surgical History:  Procedure Laterality Date  . CESAREAN SECTION  1989  . COLONOSCOPY    . FOOT SURGERY Left   . HERNIA REPAIR     Family History  Problem Relation Age of Onset  . Diabetes Mother   . Cancer - Lung Father   . Colon cancer Father    Social History  Substance Use Topics  . Smoking status: Never Smoker  . Smokeless tobacco: Never Used  . Alcohol use Yes    Current Outpatient Prescriptions:  .  mometasone-formoterol (DULERA) 200-5 MCG/ACT AERO, Inhale into the lungs., Disp: , Rfl:  .  Albuterol Sulfate (VENTOLIN HFA IN), Inhale into the lungs., Disp: , Rfl:  .  cyclobenzaprine (FLEXERIL) 5 MG tablet, Take 1 tablet (5 mg total) by mouth 3 (three) times daily as needed for muscle spasms., Disp: 15 tablet, Rfl: 0 .  fluticasone-salmeterol (ADVAIR HFA) 230-21 MCG/ACT inhaler, Inhale 2 puffs into the lungs 2 (two) times daily., Disp: , Rfl:  .  HYDROcodone-acetaminophen (NORCO) 10-325 MG per tablet, Take 1 tablet by mouth every 8 (eight) hours as needed., Disp: 30 tablet, Rfl: 0 .  naproxen sodium (ANAPROX) 220 MG tablet, Take 220 mg by mouth. Take one tablet every six to eight hours as needed for pain, Disp: , Rfl:  .   ondansetron (ZOFRAN) 8 MG tablet, Take 1 tablet (8 mg total) by mouth every 6 (six) hours as needed for nausea or vomiting., Disp: 10 tablet, Rfl: 1 .  oxyCODONE-acetaminophen (PERCOCET) 10-325 MG per tablet, Take 1 tablet by mouth every 4 (four) hours as needed for pain., Disp: , Rfl:  .  phentermine (ADIPEX-P) 37.5 MG tablet, Take 1 tablet (37.5 mg total) by mouth daily before breakfast., Disp: 30 tablet, Rfl: 0 .  predniSONE (DELTASONE) 10 MG tablet, , Disp: , Rfl:  Allergies: Advil [ibuprofen] and Percocet [oxycodone-acetaminophen]  Review of Systems  Constitutional: Negative for chills, fever and malaise/fatigue.  HENT: Negative for congestion, sinus pain and sore throat.   Eyes: Negative for blurred vision and pain.  Respiratory: Negative for cough and wheezing.   Cardiovascular: Negative for chest pain and leg swelling.  Gastrointestinal: Negative for abdominal pain, constipation, diarrhea, heartburn, nausea and vomiting.  Genitourinary: Negative for dysuria, frequency, hematuria and urgency.  Musculoskeletal: Negative for back pain, joint pain, myalgias and neck pain.  Skin: Negative for itching and rash.  Neurological: Negative for dizziness, tremors and weakness.  Endo/Heme/Allergies: Does not bruise/bleed easily.  Psychiatric/Behavioral: Negative for depression. The patient is not nervous/anxious and does not have insomnia.     Objective: BP 120/80   Pulse 75   Ht 5\' 1"  (1.549 m)   Wt 278 lb (126.1  kg)   LMP 01/05/2017   BMI 52.53 kg/m   Filed Weights   01/27/17 0927  Weight: 278 lb (126.1 kg)   Body mass index is 52.53 kg/m. Physical Exam  Constitutional: She is oriented to person, place, and time. She appears well-developed and well-nourished. No distress.  Genitourinary: Rectum normal, vagina normal and uterus normal. Pelvic exam was performed with patient supine. There is no rash or lesion on the right labia. There is no rash or lesion on the left labia. Vagina  exhibits no lesion. No bleeding in the vagina. Right adnexum does not display mass and does not display tenderness. Left adnexum does not display mass and does not display tenderness. Cervix does not exhibit motion tenderness, lesion, friability or polyp.   Uterus is mobile and midaxial. Uterus is not enlarged or exhibiting a mass.  HENT:  Head: Normocephalic and atraumatic. Head is without laceration.  Right Ear: Hearing normal.  Left Ear: Hearing normal.  Nose: No epistaxis.  No foreign bodies.  Mouth/Throat: Uvula is midline, oropharynx is clear and moist and mucous membranes are normal.  Eyes: Pupils are equal, round, and reactive to light.  Neck: Normal range of motion. Neck supple. No thyromegaly present.  Cardiovascular: Normal rate and regular rhythm.  Exam reveals no gallop and no friction rub.   No murmur heard. Pulmonary/Chest: Effort normal and breath sounds normal. No respiratory distress. She has no wheezes. Right breast exhibits no mass, no skin change and no tenderness. Left breast exhibits no mass, no skin change and no tenderness.  Abdominal: Soft. Bowel sounds are normal. She exhibits no distension. There is no tenderness. There is no rebound.  Musculoskeletal: Normal range of motion.  Neurological: She is alert and oriented to person, place, and time. No cranial nerve deficit.  Skin: Skin is warm and dry.  Psychiatric: She has a normal mood and affect. Judgment normal.  Vitals reviewed.   Assessment:  ANNUAL EXAM 1. Annual physical exam   2. Morbid obesity ([place])   3. Screening for breast cancer   4. Screen for STD (sexually transmitted disease)   5. History of vitamin D deficiency   6. History of hypercholesterolemia   7. Screening for cervical cancer   8. Screening for thyroid disorder   9. Screening for diabetes mellitus   10. Uterine leiomyoma, unspecified location      Screening Plan:            1.  Cervical Screening-  Pap smear done today  2. Breast  screening- Exam annually and mammogram>40 planned   3. Colonoscopy every 10 years, Hemoccult testing - after age 55  4. Labs Ordered today  5. Counseling for contraception: no method  Other:  1. Annual physical exam  2. Morbid obesity ([facility]) - phentermine (ADIPEX-P) 37.5 MG tablet; Take 1 tablet (37.5 mg total) by mouth daily before breakfast.  Dispense: 30 tablet; Refill: 0  3. Screening for breast cancer - MM DIGITAL SCREENING BILATERAL; Future  4. Screen for STD (sexually transmitted disease) - STD Panel - IGP,CtNgTv,Apt HPV,rfx16/18,45  5. History of vitamin D deficiency - VITAMIN D 25 Hydroxy (Vit-D Deficiency, Fractures)  6. History of hypercholesterolemia - Lipid panel; Future  7. Screening for cervical cancer - IGP,CtNgTv,Apt HPV,rfx16/18,45  8. Screening for thyroid disorder - TSH  9. Screening for diabetes mellitus - Hemoglobin A1c      F/U  Return in about 4 weeks (around 02/24/2017) for Follow up, with GYN [place].  Barnett Applebaum, MD, Cherlynn June  [place], [facility] 01/27/2017  10:21 AM

## 2017-01-28 ENCOUNTER — Encounter: Payer: Self-pay | Admitting: Obstetrics and Gynecology

## 2017-01-29 ENCOUNTER — Encounter: Payer: Self-pay | Admitting: Obstetrics & Gynecology

## 2017-01-29 LAB — HEMOGLOBIN A1C
ESTIMATED AVERAGE GLUCOSE: 105 mg/dL
HEMOGLOBIN A1C: 5.3 % (ref 4.8–5.6)

## 2017-01-29 LAB — RPR+HSVIGM+HBSAG+HSV2(IGG)+...
HIV Screen 4th Generation wRfx: NONREACTIVE
HSV 2 GLYCOPROTEIN G AB, IGG: 7.46 {index} — AB (ref 0.00–0.90)
Hepatitis B Surface Ag: NEGATIVE
RPR Ser Ql: NONREACTIVE

## 2017-01-29 LAB — TSH: TSH: 2.43 u[IU]/mL (ref 0.450–4.500)

## 2017-01-29 LAB — VITAMIN D 25 HYDROXY (VIT D DEFICIENCY, FRACTURES): Vit D, 25-Hydroxy: 18.7 ng/mL — ABNORMAL LOW (ref 30.0–100.0)

## 2017-01-29 NOTE — Progress Notes (Signed)
Letter prepared for labs, Vit D recommneded.  Will call with HSV results once also know PAPTIMA results

## 2017-01-30 LAB — IGP,CTNGTV,APT HPV,RFX16/18,45
CHLAMYDIA, NUC. ACID AMP: NEGATIVE
Gonococcus, Nuc. Acid Amp: NEGATIVE
HPV APTIMA: NEGATIVE
PAP Smear Comment: 0
TRICH VAG BY NAA: NEGATIVE

## 2017-02-02 ENCOUNTER — Telehealth: Payer: Self-pay | Admitting: Obstetrics & Gynecology

## 2017-02-02 NOTE — Telephone Encounter (Signed)
Patient is calling due to missed call from Dr. Kenton Kingfisher. Please call

## 2017-02-02 NOTE — Progress Notes (Signed)
LM

## 2017-03-04 ENCOUNTER — Ambulatory Visit: Payer: BLUE CROSS/BLUE SHIELD | Admitting: Obstetrics & Gynecology

## 2017-03-04 ENCOUNTER — Other Ambulatory Visit: Payer: BLUE CROSS/BLUE SHIELD

## 2017-04-13 ENCOUNTER — Emergency Department
Admission: EM | Admit: 2017-04-13 | Discharge: 2017-04-13 | Disposition: A | Discharge status: Home / Self Care | Payer: BLUE CROSS/BLUE SHIELD | Attending: Emergency Medicine | Admitting: Emergency Medicine

## 2017-04-13 ENCOUNTER — Emergency Department: Payer: BLUE CROSS/BLUE SHIELD

## 2017-04-13 DIAGNOSIS — M79605 Pain in left leg: Secondary | ICD-10-CM | POA: Diagnosis present

## 2017-04-13 DIAGNOSIS — Z79899 Other long term (current) drug therapy: Secondary | ICD-10-CM | POA: Diagnosis not present

## 2017-04-13 DIAGNOSIS — M791 Myalgia: Secondary | ICD-10-CM | POA: Diagnosis not present

## 2017-04-13 DIAGNOSIS — M7918 Myalgia, other site: Secondary | ICD-10-CM

## 2017-04-13 DIAGNOSIS — J45909 Unspecified asthma, uncomplicated: Secondary | ICD-10-CM | POA: Insufficient documentation

## 2017-04-13 MED ORDER — TRAMADOL HCL 50 MG PO TABS: 50.0000 mg | ORAL_TABLET | Freq: Four times a day (QID) | ORAL | 0 refills | Status: AC | PRN

## 2017-04-13 NOTE — ED Triage Notes (Signed)
Pt c/o left upper leg pani/cramping for the past month, states she was seen at [facility] walk in about 1 1/2 weeks ago and had hx but it is not getting an better.

## 2017-04-13 NOTE — ED Provider Notes (Signed)
[facility] Emergency Department Provider Note  Time seen: 12:12 PM  I have reviewed the triage vital signs and the nursing notes.   HISTORY  Chief Complaint Leg Pain    HPI [name] is a 46 y.o. female With a past medical history of asthma, presents to the emergency department for left thigh pain. According to the patient for the past 2 months she has been constricting pain in her left thigh worse with movement or walking. Patient states 8-9 months ago she had a fall hitting this area and states she had pain for one or 2 months and then the pain went away until 2 months ago when it returned. Denies any further trauma. Denies any pain in her hip. States all of her pain is in her anterior thigh on the left side. Denies any fever. Denies any chest pain, does state mild shortness of breath but she believes this is due to asthma and is unchanged from baseline per patient.  Past Medical History:  Diagnosis Date  . Asthma   . Difficulty breathing   . Family history of breast cancer    BRCA testing letter sent  . Sinus complaint     Patient Active Problem List   Diagnosis Date Noted  . Morbid obesity ([place]) 01/27/2017  . History of vitamin D deficiency 01/27/2017  . History of hypercholesterolemia 01/27/2017  . Uterine leiomyoma 01/27/2017    Past Surgical History:  Procedure Laterality Date  . CESAREAN SECTION  1989  . COLONOSCOPY    . FOOT SURGERY Left   . HERNIA REPAIR      Prior to Admission medications   Medication Sig Start Date End Date Taking? Authorizing Provider  Albuterol Sulfate (VENTOLIN HFA IN) Inhale into the lungs.    [provider]  cyclobenzaprine (FLEXERIL) 5 MG tablet Take 1 tablet (5 mg total) by mouth 3 (three) times daily as needed for muscle spasms. 05/20/16   Menshew, Dannielle Karvonen, PA-C  fluticasone-salmeterol (ADVAIR HFA) 230-21 MCG/ACT inhaler Inhale 2 puffs into the lungs 2 (two) times daily.     [provider]  HYDROcodone-acetaminophen (NORCO) 10-325 MG per tablet Take 1 tablet by mouth every 8 (eight) hours as needed. 09/26/13   Wallene Huh, DPM  mometasone-formoterol (DULERA) 200-5 MCG/ACT AERO Inhale into the lungs. 01/14/17   [provider]  naproxen sodium (ANAPROX) 220 MG tablet Take 220 mg by mouth. Take one tablet every six to eight hours as needed for pain    [provider]  ondansetron (ZOFRAN) 8 MG tablet Take 1 tablet (8 mg total) by mouth every 6 (six) hours as needed for nausea or vomiting. 08/11/13   Bronson Ing, DPM  oxyCODONE-acetaminophen (PERCOCET) 10-325 MG per tablet Take 1 tablet by mouth every 4 (four) hours as needed for pain.    [provider]  phentermine (ADIPEX-P) 37.5 MG tablet Take 1 tablet (37.5 mg total) by mouth daily before breakfast. 01/27/17   Gae Dry, MD  predniSONE (DELTASONE) 10 MG tablet  09/25/13   [provider]    Allergies  Allergen Reactions  . Advil [Ibuprofen] Other (See Comments)    Other Reaction: Other reaction-swells  . Percocet [Oxycodone-Acetaminophen] Nausea And Vomiting and Other (See Comments)    headaches    Family History  Problem Relation Age of Onset  . Diabetes Mother   . Breast cancer Mother   . Cancer - Lung Father   . Colon cancer  Father   . Breast cancer Daughter 87    Social History Social History  Substance Use Topics  . Smoking status: Never Smoker  . Smokeless tobacco: Never Used  . Alcohol use Yes    Review of Systems Constitutional: Negative for fever. Cardiovascular: Negative for chest pain. Respiratory: mild shortness of breath due to asthma per patient. Gastrointestinal: Negative for abdominal pain Musculoskeletal: left anterior thigh pain All other ROS negative  ____________________________________________   PHYSICAL EXAM:  VITAL SIGNS: ED Triage Vitals [04/13/17 1032]  Enc Vitals Group     BP (!) 121/101     Pulse Rate  94     Resp 18     Temp 98.7 F (37.1 C)     Temp Source Oral     SpO2 98 %     Weight 300 lb (136.1 kg)     Height 5' 1"  (1.549 m)     Head Circumference      Peak Flow      Pain Score 9     Pain Loc      Pain Edu?      Excl. in [place]?     Constitutional: Alert and oriented. Well appearing and in no distress. Eyes: Normal exam ENT   Head: Normocephalic and atraumatic.   Mouth/Throat: Mucous membranes are moist. Cardiovascular: Normal rate, regular rhythm. No murmur Respiratory: Normal respiratory effort without tachypnea nor retractions. slight expiratory wheeze. Gastrointestinal: Soft and nontender. No distention.   Musculoskeletal: soft thigh, slight tenderness to the anterior left thigh. No erythema. Good range of motion in the hip without tenderness in the hip. No knee tenderness. Neurologic:  Normal speech and language. No gross focal neurologic deficits  Skin:  Skin is warm, dry and intact.  Psychiatric: Mood and affect are normal.  ____________________________________________     RADIOLOGY  ultrasound is negative.  ____________________________________________   INITIAL IMPRESSION / ASSESSMENT AND PLAN / ED COURSE  Pertinent labs & imaging results that were available during my care of the patient were reviewed by me and considered in my medical decision making (see chart for details).  patient denies emergency department for left thigh pain. Patient was seen at her doctor [facility] clinic one week ago had an x-ray of the femur performed which was normal. Patient has been using Aleve without relief at home. States the pain is continued so she came to the emergency department today for evaluation. Ultrasound is negative for DVT. We'll have the patient follow up with orthopedics for further evaluation. Overall the patient appears very well. We will place on a short course of Ultram for pain control until the patient can see  orthopedics.  ____________________________________________   FINAL CLINICAL IMPRESSION(S) / ED DIAGNOSES  musculoskeletal pain    Harvest Dark, MD 04/13/17 1215

## 2017-10-10 ENCOUNTER — Other Ambulatory Visit: Payer: Self-pay

## 2017-10-10 ENCOUNTER — Emergency Department
Admission: EM | Admit: 2017-10-10 | Discharge: 2017-10-10 | Disposition: A | Discharge status: Home / Self Care | Payer: BLUE CROSS/BLUE SHIELD | Attending: Emergency Medicine | Admitting: Emergency Medicine

## 2017-10-10 ENCOUNTER — Encounter: Payer: Self-pay | Admitting: Emergency Medicine

## 2017-10-10 DIAGNOSIS — Z79899 Other long term (current) drug therapy: Secondary | ICD-10-CM | POA: Diagnosis not present

## 2017-10-10 DIAGNOSIS — J45909 Unspecified asthma, uncomplicated: Secondary | ICD-10-CM | POA: Diagnosis not present

## 2017-10-10 DIAGNOSIS — R1030 Lower abdominal pain, unspecified: Secondary | ICD-10-CM | POA: Diagnosis not present

## 2017-10-10 LAB — COMPREHENSIVE METABOLIC PANEL
ALBUMIN: 3.7 g/dL (ref 3.5–5.0)
ALT: 20 U/L (ref 14–54)
ANION GAP: 10 (ref 5–15)
AST: 25 U/L (ref 15–41)
Alkaline Phosphatase: 82 U/L (ref 38–126)
BILIRUBIN TOTAL: 0.4 mg/dL (ref 0.3–1.2)
BUN: 15 mg/dL (ref 6–20)
CHLORIDE: 102 mmol/L (ref 101–111)
CO2: 27 mmol/L (ref 22–32)
Calcium: 9 mg/dL (ref 8.9–10.3)
Creatinine, Ser: 0.62 mg/dL (ref 0.44–1.00)
GFR calc Af Amer: >60 mL/min (ref >60)
GFR calc non Af Amer: >60 mL/min (ref >60)
GLUCOSE: 88 mg/dL (ref 65–99)
POTASSIUM: 3.8 mmol/L (ref 3.5–5.1)
SODIUM: 139 mmol/L (ref 135–145)
TOTAL PROTEIN: 7.7 g/dL (ref 6.5–8.1)

## 2017-10-10 LAB — URINALYSIS, COMPLETE (UACMP) WITH MICROSCOPIC
BILIRUBIN URINE: NEGATIVE
Glucose, UA: NEGATIVE mg/dL
HGB URINE DIPSTICK: NEGATIVE
Ketones, ur: NEGATIVE mg/dL
Leukocytes, UA: NEGATIVE
NITRITE: NEGATIVE
Protein, ur: NEGATIVE mg/dL
Specific Gravity, Urine: 1.017 (ref 1.005–1.030)
pH: 6 (ref 5.0–8.0)

## 2017-10-10 LAB — CBC
HEMATOCRIT: 40.9 % (ref 35.0–47.0)
HEMOGLOBIN: 13.4 g/dL (ref 12.0–16.0)
MCH: 28.8 pg (ref 26.0–34.0)
MCHC: 32.7 g/dL (ref 32.0–36.0)
MCV: 88.3 fL (ref 80.0–100.0)
Platelets: 452 10*3/uL — ABNORMAL HIGH (ref 150–440)
RBC: 4.63 MIL/uL (ref 3.80–5.20)
RDW: 15.3 % — AB (ref 11.5–14.5)
WBC: 11.9 10*3/uL — ABNORMAL HIGH (ref 3.6–11.0)

## 2017-10-10 LAB — POCT PREGNANCY, URINE: PREG TEST UR: NEGATIVE

## 2017-10-10 LAB — LIPASE, BLOOD: LIPASE: 24 U/L (ref 11–51)

## 2017-10-10 MED ORDER — DICYCLOMINE HCL 20 MG PO TABS: 20.0000 mg | ORAL_TABLET | Freq: Three times a day (TID) | ORAL | 0 refills | Status: DC | PRN

## 2017-10-10 NOTE — ED Provider Notes (Signed)
[facility] Emergency Department Provider Note   ____________________________________________   I have reviewed the triage vital signs and the nursing notes.   HISTORY  Chief Complaint Abdominal Pain   History limited by: Not Limited   HPI [name] is a 47 y.o. female who presents to the emergency department today because of concerns for pain.  It is located in the lower abdomen and left leg.  Patient states she has a history of a left leg pain and has been evaluated for the past.  She states that since her last.  She has been having some continued lower abdominal pain.  It does radiate to her back.  She does have a history of fibroids.  States that she passed what she describes as "meat" during her last period.  Denies any fevers.   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   Past Medical History:  Diagnosis Date  . Asthma   . Difficulty breathing   . Family history of breast cancer    BRCA testing letter sent  . Sinus complaint     Patient Active Problem List   Diagnosis Date Noted  . Morbid obesity ([place]) 01/27/2017  . History of vitamin D deficiency 01/27/2017  . History of hypercholesterolemia 01/27/2017  . Uterine leiomyoma 01/27/2017    Past Surgical History:  Procedure Laterality Date  . CESAREAN SECTION  1989  . COLONOSCOPY    . FOOT SURGERY Left   . HERNIA REPAIR      Prior to Admission medications   Medication Sig Start Date End Date Taking? Authorizing Provider  Albuterol Sulfate (VENTOLIN HFA IN) Inhale into the lungs.    [provider]  cyclobenzaprine (FLEXERIL) 5 MG tablet Take 1 tablet (5 mg total) by mouth 3 (three) times daily as needed for muscle spasms. 05/20/16   Menshew, Dannielle Karvonen, PA-C  fluticasone-salmeterol (ADVAIR HFA) 230-21 MCG/ACT inhaler Inhale 2 puffs into the lungs 2 (two) times daily.    [provider]  HYDROcodone-acetaminophen (NORCO) 10-325 MG per  tablet Take 1 tablet by mouth every 8 (eight) hours as needed. 09/26/13   Wallene Huh, DPM  mometasone-formoterol (DULERA) 200-5 MCG/ACT AERO Inhale into the lungs. 01/14/17   [provider]  naproxen sodium (ANAPROX) 220 MG tablet Take 220 mg by mouth. Take one tablet every six to eight hours as needed for pain    [provider]  ondansetron (ZOFRAN) 8 MG tablet Take 1 tablet (8 mg total) by mouth every 6 (six) hours as needed for nausea or vomiting. 08/11/13   Bronson Ing, DPM  oxyCODONE-acetaminophen (PERCOCET) 10-325 MG per tablet Take 1 tablet by mouth every 4 (four) hours as needed for pain.    [provider]  phentermine (ADIPEX-P) 37.5 MG tablet Take 1 tablet (37.5 mg total) by mouth daily before breakfast. 01/27/17   Gae Dry, MD  predniSONE (DELTASONE) 10 MG tablet  09/25/13   [provider]  traMADol (ULTRAM) 50 MG tablet Take 1 tablet (50 mg total) by mouth every 6 (six) hours as needed. 04/13/17 04/13/18  Harvest Dark, MD    Allergies Advil [ibuprofen] and Percocet [oxycodone-acetaminophen]  Family History  Problem Relation Age of Onset  . Diabetes Mother   . Breast cancer Mother   . Cancer - Lung Father   . Colon cancer Father   . Breast cancer Daughter 98    Social History Social History   Tobacco Use  .  Smoking status: Never Smoker  . Smokeless tobacco: Never Used  Substance Use Topics  . Alcohol use: Yes  . Drug use: No    Review of Systems Constitutional: No fever/chills Eyes: No visual changes. ENT: No sore throat. Cardiovascular: Denies chest pain. Respiratory: Denies shortness of breath. Gastrointestinal: Positive for lower abdominal pain. Genitourinary: Negative for dysuria. Musculoskeletal: Positive for lower back pain. Skin: Negative for rash. Neurological: Negative for headaches, focal weakness or numbness.  ____________________________________________   PHYSICAL EXAM:  VITAL SIGNS: ED  Triage Vitals  Enc Vitals Group     BP 10/10/17 1422 132/71     Pulse Rate 10/10/17 1420 88     Resp 10/10/17 1420 16     Temp 10/10/17 1420 99.2 F (37.3 C)     Temp Source 10/10/17 1420 Oral     SpO2 10/10/17 1420 98 %     Weight 10/10/17 1421 270 lb (122.5 kg)     Height 10/10/17 1421 5' 1"  (1.549 m)     Head Circumference --      Peak Flow --      Pain Score 10/10/17 1421 9   Constitutional: Alert and oriented. Well appearing and in no distress. Eyes: Conjunctivae are normal.  ENT   Head: Normocephalic and atraumatic.   Nose: No congestion/rhinnorhea.   Mouth/Throat: Mucous membranes are moist.   Neck: No stridor. Hematological/Lymphatic/Immunilogical: No cervical lymphadenopathy. Cardiovascular: Normal rate, regular rhythm.  No murmurs, rubs, or gallops.  Respiratory: Normal respiratory effort without tachypnea nor retractions. Breath sounds are clear and equal bilaterally. No wheezes/rales/rhonchi. Gastrointestinal: Soft and non tender. No rebound. No guarding.  Genitourinary: Deferred Musculoskeletal: Normal range of motion in all extremities. No lower extremity edema. Neurologic:  Normal speech and language. No gross focal neurologic deficits are appreciated.  Skin:  Skin is warm, dry and intact. No rash noted. Psychiatric: Mood and affect are normal. Speech and behavior are normal. Patient exhibits appropriate insight and judgment.  ____________________________________________    LABS (pertinent positives/negatives)  Upreg negative Lipase 24 CBC wbc 11.9, hgb 13.4, plt 452 UA not consistent with infection CMP wnl  ____________________________________________   EKG  None  ____________________________________________    RADIOLOGY  None  ____________________________________________   PROCEDURES  Procedures  ____________________________________________   INITIAL IMPRESSION / ASSESSMENT AND PLAN / ED COURSE  Pertinent labs & imaging  results that were available during my care of the patient were reviewed by me and considered in my medical decision making (see chart for details).  Patient presented to the emergency department today because of concerns for lower abdominal pain primarily.  Blood work without any concerning leukocytosis.  Urine without concerning signs for infection or kidney stone.  Patient's abdominal exam is benign.  She does have a history of uterine fibroids given that the pain was related to her.  I wonder if this could be the cause of the patient's symptoms.  Will have her also trial patient on Bentyl given the prevalence of gastroenteritis in the community.  Discussed with patient importance of following up with her OB/GYN and primary care.  _______________________________________   FINAL CLINICAL IMPRESSION(S) / ED DIAGNOSES  Final diagnoses:  Lower abdominal pain     Note: This dictation was prepared with Dragon dictation. Any transcriptional errors that result from this process are unintentional     Nance Pear, MD 10/10/17 1654

## 2017-10-10 NOTE — Discharge Instructions (Signed)
Please seek medical attention for any high fevers, chest pain, shortness of breath, change in behavior, persistent vomiting, bloody stool or any other new or concerning symptoms.  

## 2017-10-10 NOTE — ED Triage Notes (Signed)
Pt to ED via POV with c/o lower back pain and abd pressure. Pt states she just got off of her menstrual cycle and had increased bleeding. Pt ambulatory, NAD noted VSS

## 2017-10-10 NOTE — ED Notes (Signed)
Pt reports that she is having back pain and abdominal pressure.  Pt reports that she missed her period in January.  Pt reports that her last period she had her last day she had a "large chunk of meat" that came out.  Pt reports that it hurts worse to sit up.  Pt reports that she could be pregnant and that she is unsure of this.  Pt reports that she has also had some n/v for the past 3-4 days.

## 2017-10-12 ENCOUNTER — Ambulatory Visit (INDEPENDENT_AMBULATORY_CARE_PROVIDER_SITE_OTHER): Payer: BLUE CROSS/BLUE SHIELD

## 2017-10-12 ENCOUNTER — Encounter: Payer: Self-pay | Admitting: Obstetrics & Gynecology

## 2017-10-12 ENCOUNTER — Ambulatory Visit (INDEPENDENT_AMBULATORY_CARE_PROVIDER_SITE_OTHER): Payer: BLUE CROSS/BLUE SHIELD | Admitting: Obstetrics & Gynecology

## 2017-10-12 VITALS — BP 118/80 | Ht 61.0 in | Wt 280.0 lb

## 2017-10-12 DIAGNOSIS — N920 Excessive and frequent menstruation with regular cycle: Secondary | ICD-10-CM | POA: Insufficient documentation

## 2017-10-12 DIAGNOSIS — D219 Benign neoplasm of connective and other soft tissue, unspecified: Secondary | ICD-10-CM

## 2017-10-12 DIAGNOSIS — R1032 Left lower quadrant pain: Secondary | ICD-10-CM | POA: Diagnosis not present

## 2017-10-12 NOTE — Progress Notes (Addendum)
Uterine Fibroids Patient presents with uterine fibroids. Periods are regular every 28-30 days, lasting 4 days, heavier now than last year.  Dysmenorrhea:mild, occurring throughout menses and onLLQ side mostly w radiaiton to back, no assoc sx's or modifiers. Cyclic symptoms include numbness and tingling along LLE; also irritability and pelvic pain. No intermenstrual bleeding, spotting, or discharge. Occasionally skips a period (Jan).    PMHx: She  has a past medical history of Asthma, Difficulty breathing, Family history of breast cancer, and Sinus complaint. Also,  has a past surgical history that includes Cesarean section (1989); Hernia repair; Foot surgery (Left); and Colonoscopy., family history includes Breast cancer in her mother; Breast cancer (age of onset: 88) in her daughter; Cancer - Lung in her father; Colon cancer in her father; Diabetes in her mother.,  reports that  has never smoked. she has never used smokeless tobacco. She reports that she drinks alcohol. She reports that she does not use drugs.  She has a current medication list which includes the following prescription(s): albuterol sulfate, cyclobenzaprine, dicyclomine, fluticasone-salmeterol, hydrocodone-acetaminophen, mometasone-formoterol, naproxen sodium, ondansetron, oxycodone-acetaminophen, phentermine, prednisone, and tramadol. Also, is allergic to advil [ibuprofen] and percocet [oxycodone-acetaminophen].  Review of Systems  Constitutional: Positive for malaise/fatigue. Negative for chills and fever.  HENT: Negative for congestion, sinus pain and sore throat.   Eyes: Negative for blurred vision and pain.  Respiratory: Positive for shortness of breath. Negative for cough and wheezing.   Cardiovascular: Positive for chest pain. Negative for leg swelling.  Gastrointestinal: Positive for nausea. Negative for abdominal pain, constipation, diarrhea, heartburn and vomiting.  Genitourinary: Negative for dysuria, frequency,  hematuria and urgency.  Musculoskeletal: Negative for back pain, joint pain, myalgias and neck pain.  Skin: Negative for itching and rash.  Neurological: Positive for dizziness, tingling and weakness. Negative for tremors.  Endo/Heme/Allergies: Bruises/bleeds easily.  Psychiatric/Behavioral: Positive for depression. The patient is not nervous/anxious and does not have insomnia.    Objective: BP 118/80   Ht 5\' 1"  (1.549 m)   Wt 280 lb (127 kg)   LMP 10/07/2017   BMI 52.91 kg/m  Physical Exam  Constitutional: She is oriented to person, place, and time. She appears well-developed and well-nourished. No distress.  Musculoskeletal: Normal range of motion.  Neurological: She is alert and oriented to person, place, and time.  Skin: Skin is warm and dry.  Psychiatric: She has a normal mood and affect.  Vitals reviewed.   ASSESSMENT/PLAN:   Problem List Items Addressed This Visit      Other   Morbid obesity ([place])   Fibroids - Primary   Relevant Orders   US PELVIS TRANSVANGINAL NON-OB (TV ONLY)   Menorrhagia with regular cycle   Relevant Orders   US PELVIS TRANSVANGINAL NON-OB (TV ONLY)    [place] later today Options discussed as to mgt Patient has abnormal uterine bleeding. Evaluation includes the following: exam, labs such as hormonal testing, and pelvic ultrasound to evaluate for any structural gynecologic abnormalities.  Patient to follow up after testing.  Treatment option for menorrhagia or menometrorrhagia discussed in great detail with the patient.  Options include hormonal therapy, IUD therapy such as Mirena, D&C, Ablation, and Hysterectomy.  The pros and cons of each option discussed with patient.    Fibroid treatment such as [place], Lupron, Myomectomy, and Hysterectomy discussed in detail, with the pros and cons of each choice counseled.  No treatment as an option also discussed, as well as control of symptoms alone with hormone therapy. Information provided to the  patient.  A  total of 20 minutes were spent face-to-face with the patient during this encounter and over half of that time dealt with counseling and coordination of care.  Barnett Applebaum, MD, [facility], [facility] 10/12/2017  11:52 AM  ADDENDUM: Review of ULTRASOUND.    I have personally reviewed images and report of recent ultrasound done at [facility].    Plan of management to be discussed with patient.  Fibroids are small No cyst or GYN source for LLQ pain  Pt describes pain as intense at this time.  LLQ.  Low back radiation.  Nausea. Plan- referral for pain evaluation as may be neurologic or GI.  Barnett Applebaum, MD, [facility], [facility] 10/12/2017  5:09 PM

## 2017-10-12 NOTE — Addendum Note (Signed)
Addended by: Gae Dry on: 10/12/2017 05:13 PM   Modules accepted: Orders

## 2017-10-13 ENCOUNTER — Encounter: Payer: Self-pay | Admitting: Obstetrics & Gynecology

## 2017-10-13 ENCOUNTER — Telehealth: Payer: Self-pay

## 2017-10-13 NOTE — Telephone Encounter (Signed)
Left voice message to let pt know her return to work not is available

## 2017-10-13 NOTE — Telephone Encounter (Signed)
OK 

## 2017-10-13 NOTE — Telephone Encounter (Signed)
Pt was PH yesterday for abdominal pain and received a note for work for yesterday.  Pt states she is still in pain today and didn't make it in to work.  She wants to see about getting a note for today as well.  She has an appt tomorrow at 9:30 at the pain clinic and will see about getting a note from them for tomorrow.  [phone]

## 2017-10-14 ENCOUNTER — Other Ambulatory Visit: Payer: Self-pay

## 2017-10-14 ENCOUNTER — Ambulatory Visit (INDEPENDENT_AMBULATORY_CARE_PROVIDER_SITE_OTHER): Payer: BLUE CROSS/BLUE SHIELD | Admitting: Gastroenterology

## 2017-10-14 ENCOUNTER — Encounter: Payer: Self-pay | Admitting: Gastroenterology

## 2017-10-14 VITALS — BP 119/76 | HR 80 | Temp 98.1°F | Ht 60.0 in | Wt 281.0 lb

## 2017-10-14 DIAGNOSIS — K5903 Drug induced constipation: Secondary | ICD-10-CM | POA: Diagnosis not present

## 2017-10-14 DIAGNOSIS — Z800 Family history of malignant neoplasm of digestive organs: Secondary | ICD-10-CM | POA: Diagnosis not present

## 2017-10-14 MED ORDER — PEG 3350-KCL-NA BICARB-NACL 420 G PO SOLR: 4000.0000 mL | Freq: Once | ORAL | 0 refills | Status: AC

## 2017-10-14 MED ORDER — BISACODYL 5 MG PO TBEC: 10.0000 mg | DELAYED_RELEASE_TABLET | Freq: Every day | ORAL | 0 refills | Status: DC | PRN

## 2017-10-14 NOTE — Patient Instructions (Signed)
F/U in 6 months. Miralax daily.    High-Fiber Diet Fiber, also called dietary fiber, is a type of carbohydrate found in fruits, vegetables, whole grains, and beans. A high-fiber diet can have many health benefits. Your health care provider may recommend a high-fiber diet to help:  Prevent constipation. Fiber can make your bowel movements more regular.  Lower your cholesterol.  Relieve hemorrhoids, uncomplicated diverticulosis, or irritable bowel syndrome.  Prevent overeating as part of a weight-loss plan.  Prevent heart disease, type 2 diabetes, and certain cancers.  What is my plan? The recommended daily intake of fiber includes:  38 grams for men under age 28.  54 grams for men over age 84.  54 grams for women under age 79.  74 grams for women over age 37.  You can get the recommended daily intake of dietary fiber by eating a variety of fruits, vegetables, grains, and beans. Your health care provider may also recommend a fiber supplement if it is not possible to get enough fiber through your diet. What do I need to know about a high-fiber diet?  Fiber supplements have not been widely studied for their effectiveness, so it is better to get fiber through food sources.  Always check the fiber content on thenutrition facts label of any prepackaged food. Look for foods that contain at least 5 grams of fiber per serving.  Ask your dietitian if you have questions about specific foods that are related to your condition, especially if those foods are not listed in the following section.  Increase your daily fiber consumption gradually. Increasing your intake of dietary fiber too quickly may cause bloating, cramping, or gas.  Drink plenty of water. Water helps you to digest fiber. What foods can I eat? Grains Whole-grain breads. Multigrain cereal. Oats and oatmeal. Brown rice. Barley. Bulgur wheat. [place]. Bran muffins. Popcorn. Rye wafer crackers. Vegetables Sweet potatoes.  Spinach. Kale. Artichokes. Cabbage. Broccoli. Green peas. Carrots. Squash. Fruits Berries. Pears. Apples. Oranges. Avocados. Prunes and raisins. Dried figs. Meats and Other Protein Sources [place], kidney, pinto, and soy beans. Split peas. Lentils. Nuts and seeds. Dairy Fiber-fortified yogurt. Beverages Fiber-fortified soy milk. Fiber-fortified orange juice. Other Fiber bars. The items listed above may not be a complete list of recommended foods or beverages. Contact your dietitian for more options. What foods are not recommended? Grains White bread. Pasta made with refined flour. White rice. Vegetables Fried potatoes. Canned vegetables. Well-cooked vegetables. Fruits Fruit juice. Cooked, strained fruit. Meats and Other Protein Sources Fatty cuts of meat. Fried Sales executive or fried fish. Dairy Milk. Yogurt. Cream cheese. Sour cream. Beverages Soft drinks. Other Cakes and pastries. Butter and oils. The items listed above may not be a complete list of foods and beverages to avoid. Contact your dietitian for more information. What are some tips for including high-fiber foods in my diet?  Eat a wide variety of high-fiber foods.  Make sure that half of all grains consumed each day are whole grains.  Replace breads and cereals made from refined flour or white flour with whole-grain breads and cereals.  Replace white rice with brown rice, bulgur wheat, or millet.  Start the day with a breakfast that is high in fiber, such as a cereal that contains at least 5 grams of fiber per serving.  Use beans in place of meat in soups, salads, or pasta.  Eat high-fiber snacks, such as berries, raw vegetables, nuts, or popcorn. This information is not intended to replace advice given to you by  your health care provider. Make sure you discuss any questions you have with your health care provider. Document Released: 07/27/2005 Document Revised: 01/02/2016 Document Reviewed: 01/09/2014 Elsevier  Interactive Patient Education  [employer].

## 2017-10-14 NOTE — Progress Notes (Signed)
[name] [address]  [place], [place] [address]  Main: [phone]  Fax: [phone]   Gastroenterology Consultation  Referring Provider:     Gae Dry, MD Primary Care Physician:  Katheren Shams Primary Gastroenterologist:  Dr. Vonda Baird Reason for Consultation:     Abdominal pain        HPI:   [name] is a 47 y.o. y/o female referred for consultation & management  by Dr. Norville Haggard, Jefm Bryant.   Patient reports 7-10-day history of bilateral lower quadrant, sharp, 5/10, abdominal pain radiating to the back.  She states she has the same pain 1-2 weeks prior to starting her menstrual cycle.  She has had pain through her menses, and this resolves typically at the end of her menses.  However, at this time it continued.  Her menses completed 4-5 days ago, but the pain continued.  She went to the ER on October 10, 2017, and they referred her to OB/GYN due to her history of uterine fibroids.  Her OB/GYN, Dr. Kenton Kingfisher saw her yesterday and noted that patient has history of uterine fibroids.  They ordered an ultrasound, and said that the fibroids are small, no cysts or GYN source for lower quadrant pain.  They referred the patient to [place] for further evaluation.  Patient reports 2-3-week history of constipation.  Reports bowel movements are hard and she is to strain with them.  Prior to this she used to have regular soft bowel movements.  Denies any blood in stool.  Denies any nausea vomiting or heartburn.  No dysphagia.  Patient has family history of colon cancer.  Father was diagnosed with colon cancer at 47 or 48 years of age.  Patient had a colonoscopy by Dr. Vira Agar, in 2010 and internal hemorrhoids were noted.  Past Medical History:  Diagnosis Date  . Asthma   . Difficulty breathing   . Family history of breast cancer    BRCA testing letter sent  . Sinus complaint     Past Surgical History:  Procedure Laterality Date  .  CESAREAN SECTION  1989  . COLONOSCOPY    . FOOT SURGERY Left   . HERNIA REPAIR      Prior to Admission medications   Medication Sig Start Date End Date Taking? Authorizing Provider  Albuterol Sulfate (VENTOLIN HFA IN) Inhale into the lungs.    [provider]  cyclobenzaprine (FLEXERIL) 5 MG tablet Take 1 tablet (5 mg total) by mouth 3 (three) times daily as needed for muscle spasms. 05/20/16   Menshew, Dannielle Karvonen, PA-C  dicyclomine (BENTYL) 20 MG tablet Take 1 tablet (20 mg total) by mouth 3 (three) times daily as needed (abdominal pain). 10/10/17   Nance Pear, MD  fluticasone-salmeterol (ADVAIR HFA) [phone] MCG/ACT inhaler Inhale 2 puffs into the lungs 2 (two) times daily.    [provider]  HYDROcodone-acetaminophen (NORCO) 10-325 MG per tablet Take 1 tablet by mouth every 8 (eight) hours as needed. 09/26/13   Wallene Huh, DPM  mometasone-formoterol (DULERA) 200-5 MCG/ACT AERO Inhale into the lungs. 01/14/17   [provider]  naproxen sodium (ANAPROX) 220 MG tablet Take 220 mg by mouth. Take one tablet every six to eight hours as needed for pain    [provider]  ondansetron (ZOFRAN) 8 MG tablet Take 1 tablet (8 mg total) by mouth every 6 (six) hours as needed for nausea or vomiting. 08/11/13   Bronson Ing, DPM  oxyCODONE-acetaminophen (PERCOCET) 10-325 MG per tablet Take 1 tablet by mouth every 4 (four) hours as needed for pain.    [provider]  phentermine (ADIPEX-P) 37.5 MG tablet Take 1 tablet (37.5 mg total) by mouth daily before breakfast. 01/27/17   Gae Dry, MD  predniSONE (DELTASONE) 10 MG tablet  09/25/13   [provider]  traMADol (ULTRAM) 50 MG tablet Take 1 tablet (50 mg total) by mouth every 6 (six) hours as needed. 04/13/17 04/13/18  Harvest Dark, MD    Family History  Problem Relation Age of Onset  . Diabetes Mother   . Breast cancer Mother   . Cancer - Lung Father   . Colon cancer Father    . Breast cancer Daughter 15     Social History   Tobacco Use  . Smoking status: Never Smoker  . Smokeless tobacco: Never Used  Substance Use Topics  . Alcohol use: Yes  . Drug use: No    Allergies as of 10/14/2017 - Review Complete 10/12/2017  Allergen Reaction Noted  . Advil [ibuprofen] Other (See Comments) 06/23/2013  . Percocet [oxycodone-acetaminophen] Nausea And Vomiting and Other (See Comments) 08/15/2013    Review of Systems:    All systems reviewed and negative except where noted in HPI.   Physical Exam:  BP 119/76   Pulse 80   Temp 98.1 F (36.7 C) (Oral)   Ht 5' (1.524 m)   Wt 281 lb (127.5 kg)   LMP 10/07/2017   BMI 54.88 kg/m  Patient's last menstrual period was 10/07/2017. Psych:  Alert and cooperative. Normal mood and affect. General:   Alert,  Well-developed, well-nourished, pleasant and cooperative in NAD Head:  Normocephalic and atraumatic. Eyes:  Sclera clear, no icterus.   Conjunctiva pink. Ears:  Normal auditory acuity. Nose:  No deformity, discharge, or lesions. Mouth:  No deformity or lesions,oropharynx pink & moist. Neck:  Supple; no masses or thyromegaly. Lungs:  Respirations even and unlabored.  Clear throughout to auscultation.   No wheezes, crackles, or rhonchi. No acute distress. Heart:  Regular rate and rhythm; no murmurs, clicks, rubs, or gallops. Abdomen:  Normal bowel sounds.  No bruits.  Soft, non-tender and non-distended without masses, hepatosplenomegaly or hernias noted.  No guarding or rebound tenderness.    Msk:  Symmetrical without gross deformities. Good, equal movement & strength bilaterally. Pulses:  Normal pulses noted. Extremities:  No clubbing or edema.  No cyanosis. Neurologic:  Alert and oriented x3;  grossly normal neurologically. Skin:  Intact without significant lesions or rashes. No jaundice. Lymph Nodes:  No significant cervical adenopathy. Psych:  Alert and cooperative. Normal mood and affect.   Labs: CBC      Component Value Date/Time   WBC 11.9 (H) 10/10/2017 1423   RBC 4.63 10/10/2017 1423   HGB 13.4 10/10/2017 1423   HGB 13.5 09/11/2014 0356   HCT 40.9 10/10/2017 1423   HCT 40.7 09/11/2014 0356   PLT 452 (H) 10/10/2017 1423   PLT 328 09/11/2014 0356   MCV 88.3 10/10/2017 1423   MCV 88 09/11/2014 0356   MCH 28.8 10/10/2017 1423   MCHC 32.7 10/10/2017 1423   RDW 15.3 (H) 10/10/2017 1423   RDW 14.6 (H) 09/11/2014 0356   LYMPHSABS 0.6 (L) 09/11/2014 0356   MONOABS 0.5 09/11/2014 0356   EOSABS 0.1 09/11/2014 0356   BASOSABS 0.1 09/11/2014 0356   CMP     Component Value Date/Time   NA 139 10/10/2017 1423   NA 140  09/11/2014 0356   K 3.8 10/10/2017 1423   K 3.5 09/11/2014 0356   CL 102 10/10/2017 1423   CL 105 09/11/2014 0356   CO2 27 10/10/2017 1423   CO2 27 09/11/2014 0356   GLUCOSE 88 10/10/2017 1423   GLUCOSE 126 (H) 09/11/2014 0356   BUN 15 10/10/2017 1423   BUN 14 09/11/2014 0356   CREATININE 0.62 10/10/2017 1423   CREATININE 0.82 09/11/2014 0356   CALCIUM 9.0 10/10/2017 1423   CALCIUM 8.6 09/11/2014 0356   PROT 7.7 10/10/2017 1423   PROT 7.0 09/11/2014 0356   ALBUMIN 3.7 10/10/2017 1423   ALBUMIN 3.1 (L) 09/11/2014 0356   AST 25 10/10/2017 1423   AST 22 09/11/2014 0356   ALT 20 10/10/2017 1423   ALT 18 09/11/2014 0356   ALKPHOS 82 10/10/2017 1423   ALKPHOS 71 09/11/2014 0356   BILITOT 0.4 10/10/2017 1423   BILITOT 0.5 09/11/2014 0356   GFRNONAA >60 10/10/2017 1423   GFRNONAA >60 09/11/2014 0356   GFRNONAA >60 09/14/2012 0501   GFRAA >60 10/10/2017 1423   GFRAA >60 09/11/2014 0356   GFRAA >60 09/14/2012 0501    Imaging Studies: US Pelvis Transvanginal Non-ob (tv Only)  Result Date: 10/12/2017 ULTRASOUND REPORT Location: [facility] Date of Service: 10/12/2017 Indications:menorrhagia Findings: The uterus is anteverted and measures 7.25 x 4.77 x 3.76cm. Echo texture is heterogenous with evidence of focal masses. Within the uterus are multiple suspected  fibroids measuring: Fibroid 1: 8.33 x 9.75m (intramural) Fibroid 2: 8.73 x 8.336m(intramural) The Endometrium measures 2.67 mm. Right Ovary is not seen Left Ovary measures 1.86 x 1.95 x 0.92 cm. It is normal in appearance. Survey of the adnexa demonstrates no adnexal masses. There is no free fluid in the cul de sac. Impression: 1. Uterine fibroids Recommendations: 1.Clinical correlation with the patient's History and Physical Exam. KrEdwena BundeRDMS, RVT Review of ULTRASOUND.    [facility] personally reviewed images [facility]   Plan of management to be discussed with patient. PaBarnett ApplebaumMD, FAFleischmannsb/Gyn, [facility] 10/12/2017  5:15 PM    Assessment and Plan:   EdAnnalis KaczmarczykcBroom Baird is a [age].o. y/o female has been referred for bilateral lower quadrant abdominal pain for 7-10 days, with recent constipation, family history of colon cancer in father  Bilateral lower quadrant abdominal pain is likely due to constipation Patient was asked to start taking a high-fiber diet Start MiraLAX or Metamucil once daily, and titrated to twice a day to maintain 1-2 soft problems daily If abdominal pain is not improved, she was asked to contact usKoreaonstipation, is also likely contributed by her narcotic use.  She does not take anything for her bowel movements and does not eat a high-fiber diet.  She was asked to follow the above recommendations because of this as well. She was asked to limit her narcotic use as well  She is due for high risk screening colonoscopy as she has family history of colon cancer, and colonoscopy is recommended starting at [age]ears of age, and then every 5 years  She is agreeable to getting this done. I have discussed alternative options, risks & benefits,  which include, but are not limited to, bleeding, infection, perforation,respiratory complication & drug reaction.  The patient agrees with this plan & written consent  will be obtained.     Dr VaVonda Baird

## 2017-10-18 ENCOUNTER — Telehealth: Payer: Self-pay | Admitting: Gastroenterology

## 2017-10-18 NOTE — Telephone Encounter (Signed)
Patient called and the [place] is not working. She is still constipated. What else can she do?

## 2017-10-18 NOTE — Telephone Encounter (Signed)
I spoke with pt and she has taken Miralax 2-3x's daily this week end and also an enema on Saturday. She had bowel movement but she feels like it isn't enough according to what she has done. She takes her pain pill at night and nothing else for pain. Pt states she cannot eat prunes or the juice. She states she is drinking water all day. She continues to have abdominal pain and was to call back today if no better. Please advise. (pt wanted to speak with you but I informed her that it would be tomorrow possibly before you read this) If you can she would like for you to call her.

## 2017-10-19 ENCOUNTER — Telehealth: Payer: Self-pay

## 2017-10-19 NOTE — Telephone Encounter (Addendum)
Pt states she had 1 BM yesterday and none today. She does strain but stool is soft. She states her stomach/abdomen feels like her period is about to come on but worse and feels like she needs to push like she is having a baby. Pain is severe in back and left side. It is hard for her to work to sit, to stand, and to sleep because the pain is unbearable. Not taking any narcotics since Friday but I understood she took one the night before last.  Took Aleve last night because she was hurting but this did not help. Also she drank some warm and cold apple juice with no results. Eating broccoli and greens and drinking lots of water every day. Pt states there has to be something wrong such as a blockage in her intestines. She would like a CT scan ordered. She states she called [facility] because pt states it was to be ordered there but was not.  Pt not advised to take dulcolax tab due to straining but she may want to give the dulcolax supp a try tonight and in the am. She is to call and let us know how this works. I advised if pain becomes worse she may go to ER but does not want to go. They have a 6 hr. Wait time and they did not do anything before (per pt.). Pt aware I will let Dr. Bonna Gains know of her concerns.

## 2017-10-19 NOTE — Telephone Encounter (Signed)
This needs to be followed up w GI

## 2017-10-19 NOTE — Telephone Encounter (Signed)
Pt reports that GI had her take Malox TID-QID Friday-Sunday. Her mom gave her an enema Nov 20, 2022. She has passed very little stool and is still in pain. She tried to f/u w/GI yesterday, but unable to reach them. Pt f/u w/RPH & GI as requested. SQ#[phone]. Pt aware RPH in OR today. Uncertain when he will see message.

## 2017-10-19 NOTE — Telephone Encounter (Signed)
Pt saw [place] last Thursday. She is requesting to speak to him again. QH#[phone]

## 2017-10-20 NOTE — Telephone Encounter (Signed)
Pt states she can not get in touch with GI DR. I told her that the GI is the DR she needs to be seen by for her problem. She wanted RPH to get in touch with the GI DR since he referred her. Pt aware she would need to keep trying, RPH is not in the office yesterday.

## 2017-10-20 NOTE — Telephone Encounter (Signed)
Pt states she is feeling a lot better today. Took dulcolax tabs in separate doses yesterday. She was in bathroom all night with diarrhea. She feels like this was the problem. Pt to take Dulcolax 10mg  tab daily, use dulcolax supp for 1-2 nights then nightly as needed. Pt voiced understanding.

## 2017-10-22 ENCOUNTER — Telehealth: Payer: Self-pay

## 2017-10-22 DIAGNOSIS — R1084 Generalized abdominal pain: Secondary | ICD-10-CM

## 2017-10-22 NOTE — Telephone Encounter (Signed)
I contacted [name] to let her know that Dr. Bonna Gains that she ordered CT scan. [name] advised and I made her an appt for  10/26/17 at 11:00am in [place]. To be fasting 4 hrs before procedure (nothing to eat or drink after midnight). [name] voiced understanding. She asked what size machine they have because she is clostaphobic. I gave [name] the number to contact them for that and if needing to change appt.

## 2017-10-22 NOTE — Telephone Encounter (Signed)
-----   Message from Virgel Manifold, MD sent at 10/22/2017  2:26 PM EDT ----- Contact: patient Go ahead and order CT Abd/Pelvis with IV contrast for abdominal pain.   ----- Message ----- From: Martie Lee, LPN Sent: 2/70/6237  12:27 PM To: Virgel Manifold, MD  Pt calls stating that her pain is back. Having rectal pressure, side and abdominal pain. Pain worsened after she went back to work today and has been standing on feet. She is taking her Dulcolax tab. And supp as you suggested. Had no BM today, but had bm the past several days. She felt better on 10/18/17 after she had taken several dulcolax and had BM. Pt wants a CT scan to find out what is wrong.  Please advise. Thank you.

## 2017-10-25 ENCOUNTER — Telehealth: Payer: Self-pay

## 2017-10-26 ENCOUNTER — Ambulatory Visit: Admission: RE | Admit: 2017-10-26 | Payer: BLUE CROSS/BLUE SHIELD | Source: Ambulatory Visit

## 2017-10-26 NOTE — Telephone Encounter (Signed)
Pt was informed of Dr. Bonna Gains suggestions on what to do. Continue high fiber diet, take ducolax 10mg  daily, Miralax in pm daily and then take ducolax supp. If needed. Unclear whether approval for CT is complete. Will have to check on this. Pt aware.

## 2017-10-26 NOTE — Telephone Encounter (Signed)
Patient is calling to let you know she is still in pain but had to cancel her CT today. She stated it was pending insurance. She needs to know what to do. Please call

## 2017-10-27 ENCOUNTER — Telehealth: Payer: Self-pay | Admitting: Gastroenterology

## 2017-10-27 ENCOUNTER — Other Ambulatory Visit: Payer: Self-pay

## 2017-10-27 MED ORDER — PEG 3350-KCL-NA BICARB-NACL 420 G PO SOLR: 4000.0000 mL | Freq: Once | ORAL | 0 refills | Status: AC

## 2017-10-27 MED ORDER — BISACODYL 5 MG PO TBEC: 10.0000 mg | DELAYED_RELEASE_TABLET | Freq: Once | ORAL | 0 refills | Status: AC

## 2017-10-27 NOTE — Telephone Encounter (Signed)
Patient needs her prep called into [facility] on [address] ASAP

## 2017-10-27 NOTE — Telephone Encounter (Signed)
Both rx's sent to pharmacy [facility] [address]

## 2017-10-28 ENCOUNTER — Other Ambulatory Visit: Payer: Self-pay

## 2017-10-28 ENCOUNTER — Telehealth: Payer: Self-pay | Admitting: Gastroenterology

## 2017-10-28 MED ORDER — PEG 3350-KCL-NA BICARB-NACL 420 G PO SOLR: 4000.0000 mL | Freq: Once | ORAL | 0 refills | Status: AC

## 2017-10-28 NOTE — Telephone Encounter (Signed)
Patient is sick and need to r/s her procedure.

## 2017-10-29 ENCOUNTER — Encounter: Admission: RE | Payer: Self-pay | Source: Ambulatory Visit

## 2017-10-29 ENCOUNTER — Ambulatory Visit
Admission: RE | Admit: 2017-10-29 | Payer: BLUE CROSS/BLUE SHIELD | Source: Ambulatory Visit | Admitting: Gastroenterology

## 2017-10-29 SURGERY — COLONOSCOPY WITH PROPOFOL
Anesthesia: General

## 2017-11-01 NOTE — Telephone Encounter (Signed)
I called pt and she states she is still hurting and is on the phone to the doctor about her CT tomorrow. I did tell pt that approval for her CT scan was given but we do not know how much.  Also was going to reschedule her colonoscopy but had to go. She had someone on the phone regarding her CT.

## 2017-11-02 ENCOUNTER — Ambulatory Visit
Admission: RE | Admit: 2017-11-02 | Discharge: 2017-11-02 | Disposition: A | Discharge status: Home / Self Care | Payer: BLUE CROSS/BLUE SHIELD | Source: Ambulatory Visit | Attending: Gastroenterology | Admitting: Gastroenterology

## 2017-11-02 DIAGNOSIS — D1803 Hemangioma of intra-abdominal structures: Secondary | ICD-10-CM | POA: Diagnosis not present

## 2017-11-02 DIAGNOSIS — K439 Ventral hernia without obstruction or gangrene: Secondary | ICD-10-CM | POA: Diagnosis not present

## 2017-11-02 DIAGNOSIS — R1084 Generalized abdominal pain: Secondary | ICD-10-CM | POA: Diagnosis present

## 2017-11-02 MED ORDER — IOPAMIDOL (ISOVUE-370) INJECTION 76%
100.0000 mL | Freq: Once | INTRAVENOUS | Status: AC | PRN
  Administered 2017-11-02: 100 mL via INTRAVENOUS

## 2017-11-04 ENCOUNTER — Telehealth: Payer: Self-pay | Admitting: Gastroenterology

## 2017-11-04 ENCOUNTER — Telehealth: Payer: Self-pay

## 2017-11-04 ENCOUNTER — Other Ambulatory Visit: Payer: Self-pay

## 2017-11-04 NOTE — Telephone Encounter (Signed)
Pt notified. See result note.

## 2017-11-04 NOTE — Telephone Encounter (Signed)
Pt requests statement on letterhead with date of ultrasound, indication and diagnosis code to report to [employer] for reimbursement.   Will call pt when ready at [phone]

## 2017-11-04 NOTE — Telephone Encounter (Signed)
results

## 2017-11-08 ENCOUNTER — Telehealth: Payer: Self-pay

## 2017-11-08 MED ORDER — PHENTERMINE HCL 37.5 MG PO TABS: 37.5000 mg | ORAL_TABLET | Freq: Every day | ORAL | 0 refills | Status: DC

## 2017-11-08 NOTE — Telephone Encounter (Signed)
Pt calling to see if [place] would call in some phentermine for her.  [phone]

## 2017-11-08 NOTE — Telephone Encounter (Signed)
OK since saw her recently.  ERx done, make appt for one month from now

## 2017-11-08 NOTE — Telephone Encounter (Signed)
Do you need her to be seen first?

## 2017-12-09 ENCOUNTER — Ambulatory Visit: Payer: BLUE CROSS/BLUE SHIELD | Admitting: Obstetrics & Gynecology

## 2018-04-05 ENCOUNTER — Telehealth: Payer: Self-pay | Admitting: Obstetrics & Gynecology

## 2018-04-05 NOTE — Telephone Encounter (Signed)
Patient is schedule 04/27/18 with [facility] for annual

## 2018-04-05 NOTE — Telephone Encounter (Signed)
-----   Message from Gae Dry, MD sent at 04/05/2018 10:05 AM EDT ----- Regarding: Appt Needs annual and mammogram

## 2018-04-18 ENCOUNTER — Ambulatory Visit: Payer: BLUE CROSS/BLUE SHIELD | Admitting: Gastroenterology

## 2018-04-27 ENCOUNTER — Ambulatory Visit: Payer: BLUE CROSS/BLUE SHIELD | Admitting: Obstetrics & Gynecology

## 2018-05-04 ENCOUNTER — Other Ambulatory Visit (HOSPITAL_COMMUNITY)
Admission: RE | Admit: 2018-05-04 | Discharge: 2018-05-04 | Disposition: A | Discharge status: Home / Self Care | Payer: BLUE CROSS/BLUE SHIELD | Source: Ambulatory Visit | Attending: Obstetrics & Gynecology | Admitting: Obstetrics & Gynecology

## 2018-05-04 ENCOUNTER — Ambulatory Visit (INDEPENDENT_AMBULATORY_CARE_PROVIDER_SITE_OTHER): Payer: BLUE CROSS/BLUE SHIELD | Admitting: Obstetrics & Gynecology

## 2018-05-04 ENCOUNTER — Encounter: Payer: Self-pay | Admitting: Obstetrics & Gynecology

## 2018-05-04 VITALS — BP 120/80 | Ht 61.0 in | Wt 287.0 lb

## 2018-05-04 DIAGNOSIS — Z01411 Encounter for gynecological examination (general) (routine) with abnormal findings: Secondary | ICD-10-CM | POA: Diagnosis not present

## 2018-05-04 DIAGNOSIS — Z1231 Encounter for screening mammogram for malignant neoplasm of breast: Secondary | ICD-10-CM | POA: Diagnosis not present

## 2018-05-04 DIAGNOSIS — Z113 Encounter for screening for infections with a predominantly sexual mode of transmission: Secondary | ICD-10-CM | POA: Diagnosis not present

## 2018-05-04 DIAGNOSIS — Z1239 Encounter for other screening for malignant neoplasm of breast: Secondary | ICD-10-CM

## 2018-05-04 DIAGNOSIS — Z0000 Encounter for general adult medical examination without abnormal findings: Secondary | ICD-10-CM

## 2018-05-04 DIAGNOSIS — Z6841 Body Mass Index (BMI) 40.0 and over, adult: Secondary | ICD-10-CM

## 2018-05-04 MED ORDER — NORETHINDRONE 0.35 MG PO TABS: 1.0000 | ORAL_TABLET | Freq: Every day | ORAL | 11 refills | Status: DC

## 2018-05-04 NOTE — Progress Notes (Signed)
HPI:      [name] is a 47 y.o. G2P1011 who LMP was Patient's last menstrual period was 04/17/2018., she presents today for her annual examination. The patient has no complaints today. The patient is sexually active. Her last pap: approximate date 2018 and was normal and last mammogram: approximate date 2018 and was normal. The patient does perform self breast exams.  There is no notable family history of breast or ovarian cancer in her family.  The patient has regular exercise: yes.  The patient denies current symptoms of depression.  Concerned w obesity, continues to gain weigth, in a spiral w taking steroids for asthma.  Irreg periods and also has vasomotor sx's that severely bother her.  Desires treatment.  GYN History: Contraception: tubal ligation  PMHx: Past Medical History:  Diagnosis Date  . Asthma   . Difficulty breathing   . Family history of breast cancer    BRCA testing letter sent  . Sinus complaint    Past Surgical History:  Procedure Laterality Date  . CESAREAN SECTION  1989  . COLONOSCOPY    . FOOT SURGERY Left   . HERNIA REPAIR     Family History  Problem Relation Age of Onset  . Diabetes Mother   . Breast cancer Mother   . Cancer - Lung Father   . Colon cancer Father   . Breast cancer Daughter 90   Social History   Tobacco Use  . Smoking status: Never Smoker  . Smokeless tobacco: Never Used  Substance Use Topics  . Alcohol use: Yes  . Drug use: No    Current Outpatient Medications:  .  Albuterol Sulfate (VENTOLIN HFA IN), Inhale into the lungs., Disp: , Rfl:  .  cyclobenzaprine (FLEXERIL) 5 MG tablet, Take 1 tablet (5 mg total) by mouth 3 (three) times daily as needed for muscle spasms., Disp: 15 tablet, Rfl: 0 .  dicyclomine (BENTYL) 20 MG tablet, Take 1 tablet (20 mg total) by mouth 3 (three) times daily as needed (abdominal pain)., Disp: 30 tablet, Rfl: 0 .  fluticasone-salmeterol (ADVAIR HFA) 230-21 MCG/ACT inhaler, Inhale 2  puffs into the lungs 2 (two) times daily., Disp: , Rfl:  .  HYDROcodone-acetaminophen (NORCO) 10-325 MG per tablet, Take 1 tablet by mouth every 8 (eight) hours as needed., Disp: 30 tablet, Rfl: 0 .  mometasone-formoterol (DULERA) 200-5 MCG/ACT AERO, Inhale into the lungs., Disp: , Rfl:  .  naproxen sodium (ANAPROX) 220 MG tablet, Take 220 mg by mouth. Take one tablet every six to eight hours as needed for pain, Disp: , Rfl:  .  ondansetron (ZOFRAN) 8 MG tablet, Take 1 tablet (8 mg total) by mouth every 6 (six) hours as needed for nausea or vomiting., Disp: 10 tablet, Rfl: 1 .  oxyCODONE-acetaminophen (PERCOCET) 10-325 MG per tablet, Take 1 tablet by mouth every 4 (four) hours as needed for pain., Disp: , Rfl:  .  phentermine (ADIPEX-P) 37.5 MG tablet, Take 1 tablet (37.5 mg total) by mouth daily before breakfast., Disp: 30 tablet, Rfl: 0 .  predniSONE (DELTASONE) 10 MG tablet, , Disp: , Rfl:  Allergies: Advil [ibuprofen] and Percocet [oxycodone-acetaminophen]  Review of Systems  Constitutional: Negative for chills, fever and malaise/fatigue.  HENT: Negative for congestion, sinus pain and sore throat.   Eyes: Negative for blurred vision and pain.  Respiratory: Negative for cough and wheezing.   Cardiovascular: Negative for chest pain and leg swelling.  Gastrointestinal: Negative for abdominal pain, constipation, diarrhea,  heartburn, nausea and vomiting.  Genitourinary: Negative for dysuria, frequency, hematuria and urgency.  Musculoskeletal: Negative for back pain, joint pain, myalgias and neck pain.  Skin: Negative for itching and rash.  Neurological: Negative for dizziness, tremors and weakness.  Endo/Heme/Allergies: Does not bruise/bleed easily.  Psychiatric/Behavioral: Negative for depression. The patient is not nervous/anxious and does not have insomnia.    Objective: BP 120/80   Ht 5' 1"  (1.549 m)   Wt 287 lb (130.2 kg)   LMP 04/17/2018   BMI 54.23 kg/m   Filed Weights    05/04/18 1403  Weight: 287 lb (130.2 kg)   Body mass index is 54.23 kg/m. Physical Exam  Constitutional: She is oriented to person, place, and time. She appears well-developed and well-nourished. No distress.  Obesity limits exam somewhat  Genitourinary: Rectum normal, vagina normal and uterus normal. Pelvic exam was performed with patient supine. There is no rash or lesion on the right labia. There is no rash or lesion on the left labia. Vagina exhibits no lesion. No bleeding in the vagina. Right adnexum does not display mass and does not display tenderness. Left adnexum does not display mass and does not display tenderness. Cervix does not exhibit motion tenderness, lesion, friability or polyp.   Uterus is mobile and midaxial. Uterus is not enlarged or exhibiting a mass.  HENT:  Head: Normocephalic and atraumatic. Head is without laceration.  Right Ear: Hearing normal.  Left Ear: Hearing normal.  Nose: No epistaxis.  No foreign bodies.  Mouth/Throat: Uvula is midline, oropharynx is clear and moist and mucous membranes are normal.  Eyes: Pupils are equal, round, and reactive to light.  Neck: Normal range of motion. Neck supple. No thyromegaly present.  Cardiovascular: Normal rate and regular rhythm. Exam reveals no gallop and no friction rub.  No murmur heard. Pulmonary/Chest: Effort normal and breath sounds normal. No respiratory distress. She has no wheezes. Right breast exhibits no mass, no skin change and no tenderness. Left breast exhibits no mass, no skin change and no tenderness.  Abdominal: Soft. Bowel sounds are normal. She exhibits no distension. There is no tenderness. There is no rebound.  Musculoskeletal: Normal range of motion.  Neurological: She is alert and oriented to person, place, and time. No cranial nerve deficit.  Skin: Skin is warm and dry.  Psychiatric: She has a normal mood and affect. Judgment normal.  Vitals reviewed.  Assessment:  ANNUAL EXAM 1. Annual  physical exam   2. Screening for breast cancer   3. Morbid obesity ([place])    Screening Plan:            1.  Cervical Screening-  Pap smear schedule reviewed with patient  2. Breast screening- Exam annually and mammogram>40 planned   3. Colonoscopy every 10 years, Hemoccult testing - after age 12  4. Labs managed by PCP  5. Counseling for contraception: bilateral tubal ligation   6. Obesity, discussed risks, needs Bariatric referral when desired/motivated.  Soon.  7. Counseled on menopause sx's and their treatments.  Weight loss may help.  Norethindrone started as desires hormonal attempt at control.  8. STD screen, per pt request    F/U  Return in about 1 year (around 05/05/2019) for Annual.  Barnett Applebaum, MD, [facility], [facility] 05/04/2018  2:14 PM

## 2018-05-04 NOTE — Patient Instructions (Addendum)
PAP every three years Mammogram every year - SOON    Call [phone] to schedule at [facility] Colonoscopy every 10 years after age 48 Labs yearly (with PCP)  Norethindrone acetate (hormone replacement) What is this medicine? NORETHINDRONE ACETATE (nor eth IN drone AS e tate) is a female hormone. This medicine is used to treat endometriosis, uterine bleeding caused by abnormal hormone levels, and secondary amenorrhea. Secondary amenorrhea is when a woman stops getting menstrual periods due to low levels of certain female hormones. This medicine may be used for other purposes; ask your health care provider or pharmacist if you have questions. COMMON BRAND NAME(S): Aygestin What should I tell my health care provider before I take this medicine? They need to know if you have any of these conditions: -blood vessel disease or blood clots -breast, cervical, or vaginal cancer -diabetes -heart disease -kidney disease -liver disease -mental depression -migraine -seizures -stroke -vaginal bleeding -an unusual or allergic reaction to norethindrone, other medicines, foods, dyes, or preservatives -pregnant or trying to get pregnant -breast-feeding How should I use this medicine? Take this medicine by mouth with a glass of water. You may take this medicine with or without food. Follow the directions on the prescription label. Take this medicine at the same time each day. Do not take your medicine more often than directed. A patient information sheet will be given with each prescription and refill. Read this sheet carefully each time. The sheet may change frequently. Talk to your pediatrician regarding the use of this medicine in children. Special care may be needed. Overdosage: If you think you have taken too much of this medicine contact a poison control center or emergency room at once. NOTE: This medicine is only for you. Do not share this medicine with others. What if I miss a dose? If you  miss a dose, take it as soon as you can. If it is almost time for your next dose, take only that dose. Do not take double or extra doses. What may interact with this medicine? Do not take this medicine with any of the following medications: -amprenavir or fosamprenavir -bosentan This medicine may also interact with the following medications: -antibiotics or medicines for infections, especially rifampin, rifabutin, rifapentine, and griseofulvin, and possibly penicillins or tetracyclines -aprepitant -barbiturate medicines, such as phenobarbital -carbamazepine -felbamate -modafinil -oxcarbazepine -phenytoin -ritonavir or other medicines for HIV infection or AIDS -St. John's wort -topiramate This list may not describe all possible interactions. Give your health care provider a list of all the medicines, herbs, non-prescription drugs, or dietary supplements you use. Also tell them if you smoke, drink alcohol, or use illegal drugs. Some items may interact with your medicine. What should I watch for while using this medicine? Visit your doctor or health care professional for regular checks on your progress. You will need a regular breast and pelvic exam and Pap smear while on this medicine. If you have any reason to think you are pregnant, stop taking this medicine right away and contact your doctor or health care professional. If you are taking this medicine for hormone related problems, it may take several cycles of use to see improvement in your condition. What side effects may I notice from receiving this medicine? Side effects that you should report to your doctor or health care professional as soon as possible: -breast tenderness or discharge -pain in the abdomen, chest, groin or leg -severe headache -skin rash, itching, or hives -sudden shortness of breath -unusually weak or tired -vision or  speech problems -yellowing of skin or eyes Side effects that usually do not require medical  attention (report to your doctor or health care professional if they continue or are bothersome): -changes in sexual desire -change in menstrual flow -facial hair growth -fluid retention and swelling -headache -irritability -nausea -weight gain or loss This list may not describe all possible side effects. Call your doctor for medical advice about side effects. You may report side effects to FDA at 1-800-FDA-1088. Where should I keep my medicine? Keep out of the reach of children. Store at room temperature between 15 and 30 degrees C (59 and 86 degrees F). Throw away any unused medicine after the expiration date. NOTE: This sheet is a summary. It may not cover all possible information. If you have questions about this medicine, talk to your doctor, pharmacist, or health care provider.  2018 Elsevier/Gold Standard (2008-02-20 14:38:36)

## 2018-05-05 LAB — HEP, RPR, HIV PANEL
HIV SCREEN 4TH GENERATION: NONREACTIVE
Hepatitis B Surface Ag: NEGATIVE
RPR Ser Ql: NONREACTIVE

## 2018-05-05 LAB — GC/CHLAMYDIA PROBE AMP (~~LOC~~) NOT AT ARMC
Chlamydia: NEGATIVE
NEISSERIA GONORRHEA: NEGATIVE

## 2018-05-06 NOTE — Progress Notes (Signed)
Pt aware.

## 2018-05-06 NOTE — Progress Notes (Signed)
Let her know ALL labs are negative/ normal

## 2018-05-18 ENCOUNTER — Telehealth: Payer: Self-pay

## 2018-05-18 NOTE — Telephone Encounter (Signed)
Go ahead and start sooner

## 2018-05-18 NOTE — Telephone Encounter (Signed)
Pt has questions about the [facility] put her on. TR#[phone]

## 2018-05-18 NOTE — Telephone Encounter (Signed)
Pt states the pharmacy was out of the OCP you rx'd on 05/04/18 & she just picked up. The instructions say to start on Sunday. She was due for her period this past Tuesday but hasn't started yet (She is irregular). Pt inquiring if she should start pills this Sunday or when to start?

## 2018-05-19 NOTE — Telephone Encounter (Signed)
Pt aware.

## 2018-05-30 ENCOUNTER — Ambulatory Visit (INDEPENDENT_AMBULATORY_CARE_PROVIDER_SITE_OTHER): Payer: BLUE CROSS/BLUE SHIELD | Admitting: Family Medicine

## 2018-05-30 ENCOUNTER — Encounter: Payer: Self-pay | Admitting: Family Medicine

## 2018-05-30 DIAGNOSIS — D75839 Thrombocytosis, unspecified: Secondary | ICD-10-CM

## 2018-05-30 DIAGNOSIS — F331 Major depressive disorder, recurrent, moderate: Secondary | ICD-10-CM

## 2018-05-30 DIAGNOSIS — E7800 Pure hypercholesterolemia, unspecified: Secondary | ICD-10-CM

## 2018-05-30 DIAGNOSIS — J4540 Moderate persistent asthma, uncomplicated: Secondary | ICD-10-CM

## 2018-05-30 DIAGNOSIS — Z131 Encounter for screening for diabetes mellitus: Secondary | ICD-10-CM

## 2018-05-30 DIAGNOSIS — N920 Excessive and frequent menstruation with regular cycle: Secondary | ICD-10-CM | POA: Diagnosis not present

## 2018-05-30 DIAGNOSIS — I890 Lymphedema, not elsewhere classified: Secondary | ICD-10-CM

## 2018-05-30 DIAGNOSIS — D473 Essential (hemorrhagic) thrombocythemia: Secondary | ICD-10-CM

## 2018-05-30 DIAGNOSIS — Z8639 Personal history of other endocrine, nutritional and metabolic disease: Secondary | ICD-10-CM

## 2018-05-30 DIAGNOSIS — Z23 Encounter for immunization: Secondary | ICD-10-CM | POA: Diagnosis not present

## 2018-05-30 MED ORDER — BUPROPION HCL ER (XL) 150 MG PO TB24: 150.0000 mg | ORAL_TABLET | Freq: Every day | ORAL | 1 refills | Status: DC

## 2018-05-30 MED ORDER — LIRAGLUTIDE -WEIGHT MANAGEMENT 18 MG/3ML ~~LOC~~ SOPN: 3.0000 mg | PEN_INJECTOR | SUBCUTANEOUS | 2 refills | Status: DC

## 2018-05-30 MED ORDER — INSULIN PEN NEEDLE 32G X 6 MM MISC: 1.0000 | Freq: Every day | 2 refills | Status: DC

## 2018-05-30 NOTE — Patient Instructions (Signed)
Obesity, Adult Obesity is having too much body fat. If you have a BMI of 30 or more, you are obese. BMI is a number that explains how much body fat you have. Obesity is often caused by taking in (consuming) more calories than your body uses. Obesity can cause serious health problems. Changing your lifestyle can help to treat obesity. Follow these instructions at home: Eating and drinking   Follow advice from your doctor about what to eat and drink. Your doctor may tell you to: ? Cut down on (limit) fast foods, sweets, and processed snack foods. ? Choose low-fat options. For example, choose low-fat milk instead of whole milk. ? Eat 5 or more servings of fruits or vegetables every day. ? Eat at home more often. This gives you more control over what you eat. ? Choose healthy foods when you eat out. ? Learn what a healthy portion size is. A portion size is the amount of a certain food that is healthy for you to eat at one time. This is different for each person. ? Keep low-fat snacks available. ? Avoid sugary drinks. These include soda, fruit juice, iced tea that is sweetened with sugar, and flavored milk. ? Eat a healthy breakfast.  Drink enough water to keep your pee (urine) clear or pale yellow.  Do not go without eating for long periods of time (do not fast).  Do not go on popular or trendy diets (fad diets). Physical Activity  Exercise often, as told by your doctor. Ask your doctor: ? What types of exercise are safe for you. ? How often you should exercise.  Warm up and stretch before being active.  Do slow stretching after being active (cool down).  Rest between times of being active. Lifestyle  Limit how much time you spend in front of your TV, computer, or video game system (be less sedentary).  Find ways to reward yourself that do not involve food.  Limit alcohol intake to no more than 1 drink a day for nonpregnant women and 2 drinks a day for men. One drink equals 12 oz  of beer, 5 oz of wine, or 1 oz of hard liquor. General instructions  Keep a weight loss journal. This can help you keep track of: ? The food that you eat. ? The exercise that you do.  Take over-the-counter and prescription medicines only as told by your doctor.  Take vitamins and supplements only as told by your doctor.  Think about joining a support group. Your doctor may be able to help with this.  Keep all follow-up visits as told by your doctor. This is important. Contact a doctor if:  You cannot meet your weight loss goal after you have changed your diet and lifestyle for 6 weeks. This information is not intended to replace advice given to you by your health care provider. Make sure you discuss any questions you have with your health care provider. Document Released: 10/19/2011 Document Revised: 01/02/2016 Document Reviewed: 05/15/2015 Elsevier Interactive Patient Education  2018 Elsevier Inc.  

## 2018-05-30 NOTE — Progress Notes (Signed)
Name: [name]   MRN: [id number]    DOB: 1971-06-08   Date:05/30/2018       Progress Note  Subjective  Chief Complaint  Chief Complaint  Patient presents with  . Establish Care  . Asthma  . Allergic Rhinitis   . Obesity    HPI  Obesity: she is very upset about her weight, she states over the past year she has gained 50 lbs. She states obesity started in her 75's.  She states she was very active when in HS ( band, cheer, track). She has tried multiple diets and tends to loose and gain it back. Her heaviest weight is now at 296.9 lbs. She was 234 lbs in 2018. She has tried phentermine and it worked well but she gained it back. She states she was skipping meals, snacking on junk food and eating on the go. She has changed her diet over the past 2 weeks. Eating oatmeal and hard boiled for breakfast, she usually has a salad or sandwich. She cooks at home for dinner. She is active at work - walks all day. She has not been active at home, but she states she has a treadmill at home and will try to walk 30 minutes daily. She will pack her lunch for work. Discussed options in terms of medication and she is willing to try Saxenda. She denies family history of thyroid cancer or personal history of pancreatitis.   Thrombocytosis: we will recheck level, done at [facility] in the past  Asthma uncontrolled : she states under the care of Dr. Raul Del, she has recurrent flares and goes on prednisone taper. She states she is stable, she has daily wheezing and dry cough, mild SOB.   Major Depression: she has been depressed for a long time, she had two miscarriage, lost her father last year, and also her niece recently. Discussed grieve counseling thought hospice. Also discussed medication and she is willing to try wellbutrin.    Patient Active Problem List   Diagnosis Date Noted  . Thrombocytosis ([place]) 05/30/2018  . Menorrhagia with regular cycle 10/12/2017  . Morbid obesity ([place]) 01/27/2017  . History  of vitamin D deficiency 01/27/2017  . History of hypercholesterolemia 01/27/2017  . Fibroids 01/27/2017  . Asthma, well controlled 01/15/2014    Past Surgical History:  Procedure Laterality Date  . CESAREAN SECTION  1989  . COLONOSCOPY    . FOOT SURGERY Left 2013  . HERNIA REPAIR      Family History  Problem Relation Age of Onset  . Diabetes Mother   . Uterine cancer Mother   . Colon cancer Mother   . Cancer - Lung Father   . Colon cancer Father   . Diverticulitis Brother   . Hernia Brother        x2 surgeries  . Colon cancer Paternal Grandmother   . Lung cancer Paternal Grandmother     Social History   Socioeconomic History  . Marital status: Married    Spouse name: Stage manager  . Number of children: 1  . Years of education: Not on file  . Highest education level: Some college, no degree  Occupational History  . Occupation: customer services   Social Needs  . Financial resource strain: Not hard at all  . Food insecurity:    Worry: Never true    Inability: Never true  . Transportation needs:    Medical: No    Non-medical: No  Tobacco Use  . Smoking status: Never  Smoker  . Smokeless tobacco: Never Used  Substance and Sexual Activity  . Alcohol use: Yes    Comment: occasionally  . Drug use: No  . Sexual activity: Yes    Partners: Male    Birth control/protection: Pill  Lifestyle  . Physical activity:    Days per week: 5 days    Minutes per session: 30 min  . Stress: Only a little  Relationships  . Social connections:    Talks on phone: More than three times a week    Gets together: Twice a week    Attends religious service: More than 4 times per year    Active member of club or organization: No    Attends meetings of clubs or organizations: Never    Relationship status: Married  . Intimate partner violence:    Fear of current or ex partner: No    Emotionally abused: No    Physically abused: No    Forced sexual activity: No  Other Topics  Concern  . Not on file  Social History Narrative   Married with her high school sweet heart , they had first child at age 2, they recently got married ( on their daughter's birthday 2019), she two miscarriages     Current Outpatient Medications:  .  Albuterol Sulfate (VENTOLIN HFA IN), Inhale into the lungs., Disp: , Rfl:  .  Cinnamon 500 MG TABS, Take 2 tablets by mouth daily., Disp: , Rfl:  .  Mepolizumab 100 MG/ML SOAJ, Inject into the skin., Disp: , Rfl:  .  mometasone-formoterol (DULERA) 200-5 MCG/ACT AERO, Inhale 2 puffs into the lungs 2 (two) times daily., Disp: , Rfl:  .  montelukast (SINGULAIR) 10 MG tablet, Take 1 tablet by mouth daily., Disp: , Rfl:  .  Multiple Vitamins-Calcium (ONE-A-DAY WITHIN PO), Take 1 tablet by mouth daily., Disp: , Rfl:  .  norethindrone (MICRONOR,CAMILA,ERRIN) 0.35 MG tablet, Take 1 tablet (0.35 mg total) by mouth daily., Disp: 1 Package, Rfl: 11 .  Omega 3 1000 MG CAPS, Take 1 capsule by mouth daily., Disp: , Rfl:  .  vitamin B-12 (CYANOCOBALAMIN) 1000 MCG tablet, Take 1,000 mcg by mouth daily., Disp: , Rfl:  .  buPROPion (WELLBUTRIN XL) 150 MG 24 hr tablet, Take 1 tablet (150 mg total) by mouth daily., Disp: 30 tablet, Rfl: 1 .  Insulin Pen Needle (NOVOFINE) 32G X 6 MM MISC, 1 each by Does not apply route daily., Disp: 100 each, Rfl: 2 .  Liraglutide -Weight Management (SAXENDA) 18 MG/3ML SOPN, Inject 3 mg into the skin every morning., Disp: 9 mL, Rfl: 2  Allergies  Allergen Reactions  . Advil [Ibuprofen] Other (See Comments)    Other Reaction: Other reaction-swells  . Percocet [Oxycodone-Acetaminophen] Nausea And Vomiting and Other (See Comments)    headaches    I personally reviewed active problem list, medication list, allergies, family history, social history with the patient/caregiver today.   ROS  Constitutional: Negative for fever or weight change.  Respiratory: Negative for cough and shortness of breath.   Cardiovascular: Negative  for chest pain or palpitations.  Gastrointestinal: Negative for abdominal pain, no bowel changes.  Musculoskeletal: Negative for gait problem or joint swelling.  Skin: Negative for rash.  Neurological: Negative for dizziness or headache.  No other specific complaints in a complete review of systems (except as listed in HPI above).  Objective  Vitals:   05/30/18 1443  BP: 136/88  Pulse: 78  Resp: 16  Temp: 98 F (36.7  C)  TempSrc: Oral  SpO2: 99%  Weight: 296 lb 14.4 oz (134.7 kg)  Height: 4\' 11"  (1.499 m)    Body mass index is 59.97 kg/m.  Physical Exam  Constitutional: Patient appears well-developed and well-nourished. Obese  No distress.  HEENT: head atraumatic, normocephalic, pupils equal and reactive to light,  neck supple, throat within normal limits Cardiovascular: Normal rate, regular rhythm and normal heart sounds.  No murmur heard. Non pitting  BLE edema. Pulmonary/Chest: Effort normal , wheezing noticed.  No respiratory distress. Abdominal: Soft.  There is no tenderness. Psychiatric: Patient has a normal mood and affect. behavior is normal. Judgment and thought content normal.  Recent Results (from the past 2160 hour(s))  GC/Chlamydia probe amp ([place])not at [facility]     Status: None   Collection Time: 05/04/18 12:00 AM  Result Value Ref Range   Chlamydia Negative     Comment: Normal Reference Range - Negative   Neisseria gonorrhea Negative     Comment: Normal Reference Range - Negative  HEP, RPR, HIV Panel     Status: None   Collection Time: 05/04/18  2:43 PM  Result Value Ref Range   Hepatitis B Surface Ag Negative Negative   RPR Ser Ql Non Reactive Non Reactive   HIV Screen 4th Generation wRfx Non Reactive Non Reactive      PHQ2/9: Depression screen PHQ 2/9 05/30/2018  Decreased Interest 3  Down, Depressed, Hopeless 2  PHQ - 2 Score 5  Altered sleeping 2  Tired, decreased energy 2  Change in appetite 2  Feeling bad or failure about yourself   2  Trouble concentrating 0  Moving slowly or fidgety/restless 0  Suicidal thoughts 1  PHQ-9 Score 14  Difficult doing work/chores Somewhat difficult     Fall Risk: Fall Risk  05/30/2018  Falls in the past year? No    Functional Status Survey: Is the patient deaf or have difficulty hearing?: No Does the patient have difficulty seeing, even when wearing glasses/contacts?: Yes Does the patient have difficulty concentrating, remembering, or making decisions?: No Does the patient have difficulty walking or climbing stairs?: Yes Does the patient have difficulty dressing or bathing?: Yes Does the patient have difficulty doing errands alone such as visiting a doctor's office or shopping?: Yes    Assessment & Plan  1. Morbid obesity ([place])  Discussed with the patient the risk posed by an increased BMI. Discussed importance of portion control, calorie counting and at least 150 minutes of physical activity weekly. Avoid sweet beverages and drink more water. Eat at least 6 servings of fruit and vegetables daily  Discussed all optiosns for weight loss medications including Belviq, Qsymia, Saxenda and Contrave. Discussed risk and benefits of each of them. - COMPLETE METABOLIC PANEL WITH GFR - TSH - Liraglutide -Weight Management (SAXENDA) 18 MG/3ML SOPN; Inject 3 mg into the skin every morning.  Dispense: 9 mL; Refill: 2  2. Need for Tdap vaccination  - Tdap vaccine greater than or equal to 7yo IM  3. Need for vaccination for pneumococcus  - Pneumococcal polysaccharide vaccine 23-valent greater than or equal to 2yo subcutaneous/IM  4. History of vitamin D deficiency  - VITAMIN D 25 Hydroxy (Vit-D Deficiency, Fractures)  5. Menorrhagia with regular cycle  Under the care of Dr. Kenton Kingfisher  6. Pure hypercholesterolemia  - Lipid panel  7. Diabetes mellitus screening  - Hemoglobin A1c  8. Thrombocytosis (HCC)  - CBC with Differential/Platelet  9. Asthma, moderate persistent,  well-controlled  Keep follow up with Dr. Raul Del   10. Moderate episode of recurrent major depressive disorder (HCC)  - buPROPion (WELLBUTRIN XL) 150 MG 24 hr tablet; Take 1 tablet (150 mg total) by mouth daily.  Dispense: 30 tablet; Refill: 1   11. Lymphedema of both lower extremities  Explained weight loss will improve lymphedema

## 2018-05-31 ENCOUNTER — Ambulatory Visit: Payer: Self-pay

## 2018-05-31 ENCOUNTER — Other Ambulatory Visit: Payer: Self-pay | Admitting: Family Medicine

## 2018-05-31 DIAGNOSIS — D72829 Elevated white blood cell count, unspecified: Secondary | ICD-10-CM

## 2018-05-31 LAB — CBC WITH DIFFERENTIAL/PLATELET
Basophils Absolute: 121 cells/uL (ref 0–200)
Basophils Relative: 0.8 %
EOS ABS: 634 {cells}/uL — AB (ref 15–500)
Eosinophils Relative: 4.2 %
HCT: 40.2 % (ref 35.0–45.0)
Hemoglobin: 13.5 g/dL (ref 11.7–15.5)
Lymphs Abs: 4122 cells/uL — ABNORMAL HIGH (ref 850–3900)
MCH: 29.2 pg (ref 27.0–33.0)
MCHC: 33.6 g/dL (ref 32.0–36.0)
MCV: 86.8 fL (ref 80.0–100.0)
MONOS PCT: 4.4 %
MPV: 10 fL (ref 7.5–12.5)
NEUTROS PCT: 63.3 %
Neutro Abs: 9558 cells/uL — ABNORMAL HIGH (ref 1500–7800)
PLATELETS: 332 10*3/uL (ref 140–400)
RBC: 4.63 10*6/uL (ref 3.80–5.10)
RDW: 13.2 % (ref 11.0–15.0)
Total Lymphocyte: 27.3 %
WBC: 15.1 10*3/uL — ABNORMAL HIGH (ref 3.8–10.8)
WBCMIX: 664 {cells}/uL (ref 200–950)

## 2018-05-31 LAB — COMPLETE METABOLIC PANEL WITH GFR
AG RATIO: 1.3 (calc) (ref 1.0–2.5)
ALT: 14 U/L (ref 6–29)
AST: 19 U/L (ref 10–35)
Albumin: 4.1 g/dL (ref 3.6–5.1)
Alkaline phosphatase (APISO): 72 U/L (ref 33–115)
BILIRUBIN TOTAL: 0.3 mg/dL (ref 0.2–1.2)
BUN: 19 mg/dL (ref 7–25)
CO2: 28 mmol/L (ref 20–32)
Calcium: 9.2 mg/dL (ref 8.6–10.2)
Chloride: 101 mmol/L (ref 98–110)
Creat: 0.9 mg/dL (ref 0.50–1.10)
GFR, EST AFRICAN AMERICAN: 88 mL/min/{1.73_m2} (ref >=60)
GFR, Est Non African American: 76 mL/min/{1.73_m2} (ref >=60)
Globulin: 3.1 g/dL (calc) (ref 1.9–3.7)
Glucose, Bld: 69 mg/dL (ref 65–139)
POTASSIUM: 4.3 mmol/L (ref 3.5–5.3)
Sodium: 139 mmol/L (ref 135–146)
TOTAL PROTEIN: 7.2 g/dL (ref 6.1–8.1)

## 2018-05-31 LAB — LIPID PANEL
CHOL/HDL RATIO: 3 (calc) (ref <5.0)
Cholesterol: 272 mg/dL — ABNORMAL HIGH (ref <200)
HDL: 90 mg/dL (ref >50)
LDL Cholesterol (Calc): 161 mg/dL (calc) — ABNORMAL HIGH
Non-HDL Cholesterol (Calc): 182 mg/dL (calc) — ABNORMAL HIGH (ref <130)
TRIGLYCERIDES: 97 mg/dL (ref <150)

## 2018-05-31 LAB — HEMOGLOBIN A1C
EAG (MMOL/L): 5.8 (calc)
Hgb A1c MFr Bld: 5.3 % of total Hgb (ref <5.7)
Mean Plasma Glucose: 105 (calc)

## 2018-05-31 LAB — VITAMIN D 25 HYDROXY (VIT D DEFICIENCY, FRACTURES): Vit D, 25-Hydroxy: 17 ng/mL — ABNORMAL LOW (ref 30–100)

## 2018-05-31 LAB — TSH: TSH: 4.61 mIU/L — ABNORMAL HIGH

## 2018-05-31 NOTE — Telephone Encounter (Signed)
Pt called after having Pneumonia and T dap injections yesterday. Pt reports that both injections were given in the same arm. Now arm is sore and painful. There is a swollen area around the injection site that is the size of a 50 cent and redness around the injection site that is the size of a quarter. Pt is at work. Deny's fever but is a little nauseated. Pt states she had a reaction a long time ago to an immunization and she had to be hospitalized.  She does not remember what immunization was involved. She has asthma. She denies SOB. Home care advice read to patient. Pt verbalized understanding of all  Pt agrees to call back with worsening symptoms.   Reason for Disposition . Injection site reaction to any vaccine  Answer Assessment - Initial Assessment Questions 1. SYMPTOMS: "What is the main symptom?" (e.g., redness, swelling, pain)      Swelling, redness,pain 2. ONSET: "When was the vaccine (shot) given?" "How much later did the pneumonia__ begin?" (e.g., hours, days ago)      2hours 3. SEVERITY: "How bad is it?"      Pain 7  Swelling knot 50 cent piece, Redness quarter size 4. FEVER: "Is there a fever?" If so, ask: "What is it, how was it measured, and when did it start?"     no 5. IMMUNIZATIONS GIVEN: "What shots have you recently received?"     Pneumonia, T dap  6. PAST REACTIONS: "Have you reacted to immunizations before?" If so, ask: "What happened?"     hospitalized 7. OTHER SYMPTOMS: "Do you have any other symptoms?"     nausea  Protocols used: IMMUNIZATION REACTIONS-A-AH

## 2018-06-13 ENCOUNTER — Telehealth: Payer: Self-pay

## 2018-06-13 NOTE — Telephone Encounter (Signed)
Called pt to remind her to get her mammo, pt states she had forgot and she will call

## 2018-06-13 NOTE — Telephone Encounter (Signed)
-----   Message from Gae Dry, MD sent at 06/13/2018  8:07 AM EST ----- Regarding: MMG Received notice she has not received MMG yet as ordered at her Annual. Please check and encourage her to do this, and document conversation.

## 2018-06-20 ENCOUNTER — Other Ambulatory Visit: Payer: Self-pay | Admitting: Obstetrics & Gynecology

## 2018-06-20 ENCOUNTER — Telehealth: Payer: Self-pay | Admitting: Obstetrics & Gynecology

## 2018-06-20 MED ORDER — VITAMIN D (ERGOCALCIFEROL) 1.25 MG (50000 UNIT) PO CAPS: 50000.0000 [IU] | ORAL_CAPSULE | ORAL | 2 refills | Status: DC

## 2018-06-20 NOTE — Telephone Encounter (Signed)
done

## 2018-06-20 NOTE — Telephone Encounter (Signed)
Patient is requesting a vitamin D supplement to be sent [address]. Please advise .

## 2018-06-20 NOTE — Telephone Encounter (Signed)
Pt aware.

## 2018-06-21 ENCOUNTER — Encounter (INDEPENDENT_AMBULATORY_CARE_PROVIDER_SITE_OTHER): Payer: Self-pay

## 2018-06-21 ENCOUNTER — Encounter: Payer: Self-pay | Admitting: Oncology

## 2018-06-21 ENCOUNTER — Inpatient Hospital Stay: Payer: BLUE CROSS/BLUE SHIELD | Attending: Oncology | Admitting: Oncology

## 2018-06-21 VITALS — BP 121/77 | HR 78 | Temp 98.5°F | Resp 18 | Ht 59.0 in | Wt 287.5 lb

## 2018-06-21 DIAGNOSIS — Z794 Long term (current) use of insulin: Secondary | ICD-10-CM

## 2018-06-21 DIAGNOSIS — R5383 Other fatigue: Secondary | ICD-10-CM

## 2018-06-21 DIAGNOSIS — Z800 Family history of malignant neoplasm of digestive organs: Secondary | ICD-10-CM | POA: Diagnosis not present

## 2018-06-21 DIAGNOSIS — D72829 Elevated white blood cell count, unspecified: Secondary | ICD-10-CM | POA: Insufficient documentation

## 2018-06-21 DIAGNOSIS — Z801 Family history of malignant neoplasm of trachea, bronchus and lung: Secondary | ICD-10-CM | POA: Diagnosis not present

## 2018-06-21 DIAGNOSIS — Z808 Family history of malignant neoplasm of other organs or systems: Secondary | ICD-10-CM | POA: Insufficient documentation

## 2018-06-21 DIAGNOSIS — J45909 Unspecified asthma, uncomplicated: Secondary | ICD-10-CM | POA: Diagnosis not present

## 2018-06-21 DIAGNOSIS — R61 Generalized hyperhidrosis: Secondary | ICD-10-CM | POA: Diagnosis not present

## 2018-06-21 DIAGNOSIS — Z79899 Other long term (current) drug therapy: Secondary | ICD-10-CM | POA: Diagnosis not present

## 2018-06-21 NOTE — Progress Notes (Signed)
Hematology/Oncology Consult note [facility] Telephone:([phone] [phone]  Patient Care Team: Steele Sizer, MD as PCP - General (Family Medicine)   Name of the patient: [name]  [id number]  Nov 13, 1970    Reason for referral-leukocytosis   Referring physician- Dr. Ancil Boozer  Date of visit: 06/21/18   History of presenting illness-patient is a 47 year old female with a past medical history significant for obesity, asthma, fibroids who is been sent to [place] for evaluation of leukocytosis.  Of note patient has had chronic leukocytosis at least dating back to 2015 and a white count typically ranges around 12.  There are times when her white count was as high as 23 as well.  Patient does have a history of chronic asthma and is on steroids off and on for about 4 months of the year.  She sees Dr. Raul Del for the same.  She is currently on a steroid taper as well.  Recent CBC from 05/30/2018 showed white count of 15.1, H&H of 13.5/40.2 platelet count of 332.  Differential mainly showed neutrophilia along with lymphocytosis and eosinophilia.  She has had significant weight gain over the last 1 year.  Reports no and unintentional fatigue or drenching night sweats.  She is a lifelong non-smoker  ECOG PS- 0  Pain scale- 0   Review of systems- Review of Systems  Constitutional: Negative for chills, fever, malaise/fatigue and weight loss.  HENT: Negative for congestion, ear discharge and nosebleeds.   Eyes: Negative for blurred vision.  Respiratory: Negative for cough, hemoptysis, sputum production, shortness of breath and wheezing.   Cardiovascular: Negative for chest pain, palpitations, orthopnea and claudication.  Gastrointestinal: Negative for abdominal pain, blood in stool, constipation, diarrhea, heartburn, melena, nausea and vomiting.  Genitourinary: Negative for dysuria, flank pain, frequency, hematuria and urgency.  Musculoskeletal: Negative  for back pain, joint pain and myalgias.  Skin: Negative for rash.  Neurological: Negative for dizziness, tingling, focal weakness, seizures, weakness and headaches.  Endo/Heme/Allergies: Does not bruise/bleed easily.  Psychiatric/Behavioral: Negative for depression and suicidal ideas. The patient does not have insomnia.     Allergies  Allergen Reactions  . Advil [Ibuprofen] Other (See Comments)    Other Reaction: Other reaction-swells  . Percocet [Oxycodone-Acetaminophen] Nausea And Vomiting and Other (See Comments)    headaches    Patient Active Problem List   Diagnosis Date Noted  . Thrombocytosis ([facility]) 05/30/2018  . Lymphedema of both lower extremities 05/30/2018  . Moderate episode of recurrent major depressive disorder ([place]) 05/30/2018  . Pure hypercholesterolemia 05/30/2018  . Menorrhagia with regular cycle 10/12/2017  . Morbid obesity ([place]) 01/27/2017  . History of vitamin D deficiency 01/27/2017  . History of hypercholesterolemia 01/27/2017  . Fibroids 01/27/2017  . Asthma, well controlled 01/15/2014     Past Medical History:  Diagnosis Date  . Asthma   . Difficulty breathing   . Family history of breast cancer    BRCA testing letter sent  . Sinus complaint      Past Surgical History:  Procedure Laterality Date  . CESAREAN SECTION  1989  . COLONOSCOPY    . FOOT SURGERY Left 2013  . HERNIA REPAIR      Social History   Socioeconomic History  . Marital status: Married    Spouse name: Stage manager  . Number of children: 1  . Years of education: Not on file  . Highest education level: Some college, no degree  Occupational History  . Occupation: Designer, television/film set  Social Needs  . Financial resource strain: Not hard at all  . Food insecurity:    Worry: Never true    Inability: Never true  . Transportation needs:    Medical: No    Non-medical: No  Tobacco Use  . Smoking status: Never Smoker  . Smokeless tobacco: Never Used  Substance and Sexual  Activity  . Alcohol use: Yes    Comment: occasionally  . Drug use: No  . Sexual activity: Yes    Partners: Male    Birth control/protection: Pill  Lifestyle  . Physical activity:    Days per week: 5 days    Minutes per session: 30 min  . Stress: Only a little  Relationships  . Social connections:    Talks on phone: More than three times a week    Gets together: Twice a week    Attends religious service: More than 4 times per year    Active member of club or organization: No    Attends meetings of clubs or organizations: Never    Relationship status: Married  . Intimate partner violence:    Fear of current or ex partner: No    Emotionally abused: No    Physically abused: No    Forced sexual activity: No  Other Topics Concern  . Not on file  Social History Narrative   Married with her high school sweet heart , they had first child at age 65, they recently got married ( on their daughter's birthday 2019), she two miscarriages     Family History  Problem Relation Age of Onset  . Diabetes Mother   . Uterine cancer Mother   . Colon cancer Mother   . Cancer - Lung Father   . Colon cancer Father   . Diverticulitis Brother   . Hernia Brother        x2 surgeries  . Colon cancer Paternal Grandmother   . Lung cancer Paternal Grandmother      Current Outpatient Medications:  .  Cinnamon 500 MG TABS, Take 2 tablets by mouth daily., Disp: , Rfl:  .  Insulin Pen Needle (NOVOFINE) 32G X 6 MM MISC, 1 each by Does not apply route daily., Disp: 100 each, Rfl: 2 .  Liraglutide -Weight Management (SAXENDA) 18 MG/3ML SOPN, Inject 3 mg into the skin every morning., Disp: 9 mL, Rfl: 2 .  Mepolizumab 100 MG/ML SOAJ, Inject into the skin., Disp: , Rfl:  .  mometasone-formoterol (DULERA) 200-5 MCG/ACT AERO, Inhale 2 puffs into the lungs 2 (two) times daily., Disp: , Rfl:  .  montelukast (SINGULAIR) 10 MG tablet, Take 1 tablet by mouth daily., Disp: , Rfl:  .  Multiple Vitamins-Calcium  (ONE-A-DAY WITHIN PO), Take 1 tablet by mouth daily., Disp: , Rfl:  .  norethindrone (MICRONOR,CAMILA,ERRIN) 0.35 MG tablet, Take 1 tablet (0.35 mg total) by mouth daily., Disp: 1 Package, Rfl: 11 .  Omega 3 1000 MG CAPS, Take 1 capsule by mouth daily., Disp: , Rfl:  .  predniSONE (DELTASONE) 10 MG tablet, Prednisone 10 mg, 4 pills q day x 2 days, 3 pills q day x 2 days, 2 pills q day x 2 days, 1 pill q day x 2 days., Disp: , Rfl:  .  vitamin B-12 (CYANOCOBALAMIN) 1000 MCG tablet, Take 1,000 mcg by mouth daily., Disp: , Rfl:  .  Vitamin D, Ergocalciferol, (DRISDOL) 1.25 MG (50000 UT) CAPS capsule, Take 1 capsule (50,000 Units total) by mouth every 7 (seven) days. For 3  months., Disp: 4 capsule, Rfl: 2 .  Albuterol Sulfate (VENTOLIN HFA IN), Inhale into the lungs., Disp: , Rfl:  .  buPROPion (WELLBUTRIN XL) 150 MG 24 hr tablet, Take 1 tablet (150 mg total) by mouth daily. (Patient not taking: Reported on 06/21/2018), Disp: 30 tablet, Rfl: 1   Physical exam:  Vitals:   06/21/18 1332  BP: 121/77  Pulse: 78  Resp: 18  Temp: 98.5 F (36.9 C)  TempSrc: Tympanic  SpO2: 97%  Weight: 287 lb 8 oz (130.4 kg)  Height: 4' 11"  (1.499 m)   Physical Exam  Constitutional: She is oriented to person, place, and time.  Patient is morbidly obese.  Appears in no acute distress  HENT:  Head: Normocephalic and atraumatic.  Eyes: Pupils are equal, round, and reactive to light. EOM are normal.  Neck: Normal range of motion.  Cardiovascular: Normal rate, regular rhythm and normal heart sounds.  Pulmonary/Chest: Effort normal and breath sounds normal.  Abdominal: Soft. Bowel sounds are normal.  Lymphadenopathy:  No palpable cervical, supraclavicular, axillary or inguinal adenopathy   Neurological: She is alert and oriented to person, place, and time.  Skin: Skin is warm and dry.       CMP Latest Ref Rng & Units 05/30/2018  Glucose 65 - 139 mg/dL 69  BUN 7 - 25 mg/dL 19  Creatinine 0.50 - 1.10 mg/dL  0.90  Sodium 135 - 146 mmol/L 139  Potassium 3.5 - 5.3 mmol/L 4.3  Chloride 98 - 110 mmol/L 101  CO2 20 - 32 mmol/L 28  Calcium 8.6 - 10.2 mg/dL 9.2  Total Protein 6.1 - 8.1 g/dL 7.2  Total Bilirubin 0.2 - 1.2 mg/dL 0.3  Alkaline Phos 38 - 126 U/L -  AST 10 - 35 U/L 19  ALT 6 - 29 U/L 14   CBC Latest Ref Rng & Units 05/30/2018  WBC 3.8 - 10.8 Thousand/uL 15.1(H)  Hemoglobin 11.7 - 15.5 g/dL 13.5  Hematocrit 35.0 - 45.0 % 40.2  Platelets 140 - 400 Thousand/uL 332     Assessment and plan- Patient is a 47 y.o. female referred for chronic leukocytosis  Patient has had stable leukocytosis for the last 4 years.  Suspect this is secondary to underlying asthma and or chronic steroid use.  She is currently taking steroids and I will therefore wait for her to come off steroids at which point I will check a CBC with differential, smear review, flow cytometry and BCR able fish testing next week.  I will see her back in 3 weeks time to discuss the results of her blood work and further management.  Likelihood of underlying myeloproliferative disorder is low given chronic stable leukocytosis   Thank you for this kind referral and the opportunity to participate in the care of this patient   Visit Diagnosis 1. Leukocytosis, unspecified type     Dr. Randa Evens, MD, MPH [facility] at [facility] [phone] 06/21/2018 2:18 PM

## 2018-06-21 NOTE — Progress Notes (Signed)
No new changes noted today 

## 2018-06-23 ENCOUNTER — Telehealth: Payer: Self-pay

## 2018-06-23 ENCOUNTER — Ambulatory Visit
Admission: RE | Admit: 2018-06-23 | Discharge: 2018-06-23 | Disposition: A | Discharge status: Home / Self Care | Payer: BLUE CROSS/BLUE SHIELD | Source: Ambulatory Visit | Attending: Obstetrics & Gynecology | Admitting: Obstetrics & Gynecology

## 2018-06-23 DIAGNOSIS — Z1239 Encounter for other screening for malignant neoplasm of breast: Secondary | ICD-10-CM | POA: Insufficient documentation

## 2018-06-23 NOTE — Telephone Encounter (Signed)
She lost 17 lbs with [place] but not covered by insurance. Advised to call insurance and find out what is covered by her insurance

## 2018-06-23 NOTE — Telephone Encounter (Signed)
Copied from [place] [id number]. Topic: General - Other >> Jun 23, 2018  8:40 AM Lennox Solders wrote: Reason for CRM: pt is calling and would like dr Ancil Boozer to call her back personally. Pt said its personal and she did not elaborate reason  Per Dr. Steele Sizer,  I contacted this patient to see if I can be of help to her since Dr. Ancil Boozer is seeing patients and will not be able to speak to her at the moment, but she was adamant that she only wanted to speak to Dr. Ancil Boozer even if it is after hours.   I tried to get her to sign up with MyChart or to come in for an office visit, but she stated that she just needed a phone call.

## 2018-06-23 NOTE — Telephone Encounter (Signed)
Please call patient and explain that I am seeing patients and will not be able to call her now.

## 2018-06-24 NOTE — Telephone Encounter (Signed)
Copied from [place] [id number]. Topic: General - Other >> Jun 24, 2018 10:47 AM Burchel, Abbi R wrote: Medication: "Diet Shot"  Pt states her insurance will not cover these shots, and she would like to know if she could get another sample.  Please call pt to advise.   [phone]

## 2018-06-24 NOTE — Telephone Encounter (Signed)
Spoke with patient and informed her to call insurance and inquire if they covered either one of those weight loss medications.

## 2018-06-24 NOTE — Telephone Encounter (Signed)
We only give samples for initiation of therapy, very limited amount, we can talk about options on her next visit. Ask her to check if Belviq or Contrave are covered by insurance

## 2018-07-01 ENCOUNTER — Inpatient Hospital Stay: Payer: BLUE CROSS/BLUE SHIELD

## 2018-07-01 DIAGNOSIS — D72829 Elevated white blood cell count, unspecified: Secondary | ICD-10-CM

## 2018-07-01 LAB — CBC WITH DIFFERENTIAL/PLATELET
HCT: 38.1 % (ref 36.0–46.0)
HEMOGLOBIN: 12.4 g/dL (ref 12.0–15.0)
MCH: 28.5 pg (ref 26.0–34.0)
MCHC: 32.5 g/dL (ref 30.0–36.0)
MCV: 87.6 fL (ref 80.0–100.0)
Platelets: 340 10*3/uL (ref 150–400)
RBC: 4.35 MIL/uL (ref 3.87–5.11)
RDW: 13.7 % (ref 11.5–15.5)
WBC: 10.7 10*3/uL — AB (ref 4.0–10.5)
nRBC: 0 % (ref 0.0–0.2)

## 2018-07-01 LAB — DIFFERENTIAL
ABS IMMATURE GRANULOCYTES: 0.04 10*3/uL (ref 0.00–0.07)
Basophils Absolute: 0.1 10*3/uL (ref 0.0–0.1)
Basophils Relative: 1 %
Eosinophils Absolute: 1.1 10*3/uL — ABNORMAL HIGH (ref 0.0–0.5)
Eosinophils Relative: 10 %
IMMATURE GRANULOCYTES: 0 %
LYMPHS ABS: 3 10*3/uL (ref 0.7–4.0)
LYMPHS PCT: 28 %
Monocytes Absolute: 0.5 10*3/uL (ref 0.1–1.0)
Monocytes Relative: 5 %
Neutro Abs: 6 10*3/uL (ref 1.7–7.7)
Neutrophils Relative %: 56 %

## 2018-07-01 LAB — TECHNOLOGIST SMEAR REVIEW: Tech Review: NORMAL

## 2018-07-06 LAB — COMP PANEL: LEUKEMIA/LYMPHOMA

## 2018-07-11 ENCOUNTER — Other Ambulatory Visit: Payer: Self-pay

## 2018-07-11 ENCOUNTER — Emergency Department: Payer: BLUE CROSS/BLUE SHIELD

## 2018-07-11 ENCOUNTER — Inpatient Hospital Stay
Admission: EM | Admit: 2018-07-11 | Discharge: 2018-07-13 | DRG: 062 | Disposition: A | Discharge status: Home / Self Care | Payer: BLUE CROSS/BLUE SHIELD | Attending: Internal Medicine | Admitting: Internal Medicine

## 2018-07-11 DIAGNOSIS — Z833 Family history of diabetes mellitus: Secondary | ICD-10-CM

## 2018-07-11 DIAGNOSIS — E7800 Pure hypercholesterolemia, unspecified: Secondary | ICD-10-CM | POA: Diagnosis present

## 2018-07-11 DIAGNOSIS — Z801 Family history of malignant neoplasm of trachea, bronchus and lung: Secondary | ICD-10-CM

## 2018-07-11 DIAGNOSIS — Z823 Family history of stroke: Secondary | ICD-10-CM

## 2018-07-11 DIAGNOSIS — R29704 NIHSS score 4: Secondary | ICD-10-CM | POA: Diagnosis present

## 2018-07-11 DIAGNOSIS — R202 Paresthesia of skin: Secondary | ICD-10-CM | POA: Diagnosis present

## 2018-07-11 DIAGNOSIS — Z803 Family history of malignant neoplasm of breast: Secondary | ICD-10-CM

## 2018-07-11 DIAGNOSIS — I639 Cerebral infarction, unspecified: Principal | ICD-10-CM | POA: Diagnosis present

## 2018-07-11 DIAGNOSIS — Z885 Allergy status to narcotic agent status: Secondary | ICD-10-CM | POA: Diagnosis not present

## 2018-07-11 DIAGNOSIS — Z79899 Other long term (current) drug therapy: Secondary | ICD-10-CM | POA: Diagnosis not present

## 2018-07-11 DIAGNOSIS — J45909 Unspecified asthma, uncomplicated: Secondary | ICD-10-CM | POA: Diagnosis present

## 2018-07-11 DIAGNOSIS — E559 Vitamin D deficiency, unspecified: Secondary | ICD-10-CM | POA: Diagnosis present

## 2018-07-11 DIAGNOSIS — Z886 Allergy status to analgesic agent status: Secondary | ICD-10-CM

## 2018-07-11 DIAGNOSIS — Z86718 Personal history of other venous thrombosis and embolism: Secondary | ICD-10-CM

## 2018-07-11 DIAGNOSIS — Z800 Family history of malignant neoplasm of digestive organs: Secondary | ICD-10-CM

## 2018-07-11 DIAGNOSIS — R609 Edema, unspecified: Secondary | ICD-10-CM

## 2018-07-11 DIAGNOSIS — Z7951 Long term (current) use of inhaled steroids: Secondary | ICD-10-CM

## 2018-07-11 DIAGNOSIS — K219 Gastro-esophageal reflux disease without esophagitis: Secondary | ICD-10-CM | POA: Diagnosis present

## 2018-07-11 DIAGNOSIS — Z8049 Family history of malignant neoplasm of other genital organs: Secondary | ICD-10-CM | POA: Diagnosis not present

## 2018-07-11 DIAGNOSIS — Z6841 Body Mass Index (BMI) 40.0 and over, adult: Secondary | ICD-10-CM

## 2018-07-11 DIAGNOSIS — R29898 Other symptoms and signs involving the musculoskeletal system: Secondary | ICD-10-CM

## 2018-07-11 DIAGNOSIS — E785 Hyperlipidemia, unspecified: Secondary | ICD-10-CM | POA: Diagnosis present

## 2018-07-11 DIAGNOSIS — R200 Anesthesia of skin: Secondary | ICD-10-CM

## 2018-07-11 LAB — BCR-ABL1 FISH
Cells Analyzed: 200
Cells Counted: 200

## 2018-07-11 LAB — MRSA PCR SCREENING: MRSA by PCR: NEGATIVE

## 2018-07-11 LAB — GLUCOSE, CAPILLARY: GLUCOSE-CAPILLARY: 87 mg/dL (ref 70–99)

## 2018-07-11 MED ORDER — ALTEPLASE 100 MG IV SOLR
INTRAVENOUS | Status: AC
  Administered 2018-07-11: 90 mg via INTRAVENOUS
  Filled 2018-07-11: qty 100

## 2018-07-11 MED ORDER — LABETALOL HCL 5 MG/ML IV SOLN
INTRAVENOUS | Status: AC
  Filled 2018-07-11: qty 4

## 2018-07-11 MED ORDER — ACETAMINOPHEN 325 MG PO TABS: 650.0000 mg | ORAL_TABLET | ORAL | Status: DC | PRN

## 2018-07-11 MED ORDER — STROKE: EARLY STAGES OF RECOVERY BOOK: Freq: Once | Status: DC

## 2018-07-11 MED ORDER — PANTOPRAZOLE SODIUM 40 MG IV SOLR
40.0000 mg | Freq: Every day | INTRAVENOUS | Status: DC
  Administered 2018-07-11: 40 mg via INTRAVENOUS
  Filled 2018-07-11: qty 40

## 2018-07-11 MED ORDER — MOMETASONE FURO-FORMOTEROL FUM 200-5 MCG/ACT IN AERO
2.0000 | INHALATION_SPRAY | Freq: Two times a day (BID) | RESPIRATORY_TRACT | Status: DC
  Administered 2018-07-11 – 2018-07-13 (×4): 2 via RESPIRATORY_TRACT
  Filled 2018-07-11 (×2): qty 8.8

## 2018-07-11 MED ORDER — CLEVIDIPINE BUTYRATE 0.5 MG/ML IV EMUL: 0.0000 mg/h | INTRAVENOUS | Status: DC

## 2018-07-11 MED ORDER — MONTELUKAST SODIUM 10 MG PO TABS
10.0000 mg | ORAL_TABLET | Freq: Every day | ORAL | Status: DC
  Administered 2018-07-12: 21:00:00 10 mg via ORAL
  Filled 2018-07-11 (×3): qty 1

## 2018-07-11 MED ORDER — ACETAMINOPHEN 650 MG RE SUPP: 650.0000 mg | RECTAL | Status: DC | PRN

## 2018-07-11 MED ORDER — SODIUM CHLORIDE 0.9 % IV SOLN
INTRAVENOUS | Status: DC
  Administered 2018-07-11: 16:00:00 via INTRAVENOUS

## 2018-07-11 MED ORDER — LABETALOL HCL 5 MG/ML IV SOLN: 20.0000 mg | Freq: Once | INTRAVENOUS | Status: DC

## 2018-07-11 MED ORDER — ALTEPLASE (STROKE) FULL DOSE INFUSION
90.0000 mg | Freq: Once | INTRAVENOUS | Status: AC
  Administered 2018-07-11: 90 mg via INTRAVENOUS
  Filled 2018-07-11: qty 100

## 2018-07-11 MED ORDER — ACETAMINOPHEN 160 MG/5ML PO SOLN
650.0000 mg | ORAL | Status: DC | PRN
  Filled 2018-07-11: qty 20.3

## 2018-07-11 MED ORDER — ALBUTEROL SULFATE (2.5 MG/3ML) 0.083% IN NEBU: 3.0000 mL | INHALATION_SOLUTION | Freq: Four times a day (QID) | RESPIRATORY_TRACT | Status: DC | PRN

## 2018-07-11 MED ORDER — SODIUM CHLORIDE 0.9 % IV SOLN
50.0000 mL | Freq: Once | INTRAVENOUS | Status: AC
  Administered 2018-07-11: 50 mL via INTRAVENOUS

## 2018-07-11 MED ORDER — SENNOSIDES-DOCUSATE SODIUM 8.6-50 MG PO TABS: 1.0000 | ORAL_TABLET | Freq: Every evening | ORAL | Status: DC | PRN

## 2018-07-11 NOTE — ED Provider Notes (Signed)
[facility] Emergency Department Provider Note ____________________________________________   I have reviewed the triage vital signs and the triage nursing note.  HISTORY  Chief Complaint Shoulder Pain and Leg Pain   Historian Patient  HPI [name] is a 47 y.o. 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  States she woke up this morning fine.  She was at work around 8 AM when she felt numbness of her right hand and dropped her stapler.  This lasted about 15 minutes and then went back to complete normal.  At 11 AM she had acute onset of right arm and right leg weakness where she dropped the stapler again.  She had a near syncopal event, but no injury, and no true loss of consciousness.  Family history of strokes, but no personal history of stroke.  No known coronary vascular disease, hypertension.  She is significantly morbidly obese.  States that her right shoulder and her right hand are painful as well as numb.  She has a history of asthma and states that she has some chest pressure that feels like asthma, but no significant chest pain.     Past Medical History:  Diagnosis Date  . Asthma   . Difficulty breathing   . Family history of breast cancer    BRCA testing letter sent  . Sinus complaint     Patient Active Problem List   Diagnosis Date Noted  . Thrombocytosis ([place]) 05/30/2018  . Lymphedema of both lower extremities 05/30/2018  . Moderate episode of recurrent major depressive disorder ([place]) 05/30/2018  . Pure hypercholesterolemia 05/30/2018  . Menorrhagia with regular cycle 10/12/2017  . Morbid obesity ([place]) 01/27/2017  . History of vitamin D deficiency 01/27/2017  . History of hypercholesterolemia 01/27/2017  . Fibroids 01/27/2017  . Asthma, well controlled 01/15/2014    Past Surgical History:  Procedure  Laterality Date  . CESAREAN SECTION  1989  . COLONOSCOPY    . FOOT SURGERY Left 2013  . HERNIA REPAIR      Prior to Admission medications   Medication Sig Start Date End Date Taking? Authorizing Provider  Albuterol Sulfate (VENTOLIN HFA IN) Inhale into the lungs.    [provider]  buPROPion (WELLBUTRIN XL) 150 MG 24 hr tablet Take 1 tablet (150 mg total) by mouth daily. Patient not taking: Reported on 06/21/2018 05/30/18   Steele Sizer, MD  Cinnamon 500 MG TABS Take 2 tablets by mouth daily.    [provider]  Insulin Pen Needle (NOVOFINE) 32G X 6 MM MISC 1 each by Does not apply route daily. 05/30/18   Steele Sizer, MD  Liraglutide -Weight Management (SAXENDA) 18 MG/3ML SOPN Inject 3 mg into the skin every morning. 05/30/18   Steele Sizer, MD  Mepolizumab 100 MG/ML SOAJ Inject into the skin. 05/16/18   [provider]  mometasone-formoterol (DULERA) 200-5 MCG/ACT AERO Inhale 2 puffs into the lungs 2 (two) times daily. 01/14/17   Erby Pian, MD  montelukast (SINGULAIR) 10 MG tablet Take 1 tablet by mouth daily. 04/22/18 04/22/19  Erby Pian, MD  Multiple Vitamins-Calcium (ONE-A-DAY WITHIN PO) Take 1 tablet by mouth daily.    [provider]  norethindrone (MICRONOR,CAMILA,ERRIN) 0.35 MG tablet Take 1 tablet (0.35 mg total) by mouth daily. 05/04/18   Gae Dry, MD  Omega 3 1000 MG CAPS Take 1 capsule by mouth  daily.    [provider]  predniSONE (DELTASONE) 10 MG tablet Prednisone 10 mg, 4 pills q day x 2 days, 3 pills q day x 2 days, 2 pills q day x 2 days, 1 pill q day x 2 days. 06/17/18   [provider]  vitamin B-12 (CYANOCOBALAMIN) 1000 MCG tablet Take 1,000 mcg by mouth daily.    [provider]  Vitamin D, Ergocalciferol, (DRISDOL) 1.25 MG (50000 UT) CAPS capsule Take 1 capsule (50,000 Units total) by mouth every 7 (seven) days. For 3 months. 06/20/18   Gae Dry, MD    Allergies   Allergen Reactions  . Advil [Ibuprofen] Other (See Comments)    Other Reaction: Other reaction-swells  . Percocet [Oxycodone-Acetaminophen] Nausea And Vomiting and Other (See Comments)    headaches    Family History  Problem Relation Age of Onset  . Diabetes Mother   . Uterine cancer Mother   . Colon cancer Mother   . Cancer - Lung Father   . Colon cancer Father   . Diverticulitis Brother   . Hernia Brother        x2 surgeries  . Colon cancer Paternal Grandmother   . Lung cancer Paternal Grandmother     Social History Social History   Tobacco Use  . Smoking status: Never Smoker  . Smokeless tobacco: Never Used  Substance Use Topics  . Alcohol use: Yes    Comment: occasionally  . Drug use: No    Review of Systems  Constitutional: Negative for fever. Eyes: Negative for visual changes. ENT: Negative for sore throat. Cardiovascular: Negative for chest pain. Respiratory: Mild chest tightness/wheezing but no coughing. Gastrointestinal: Negative for abdominal pain, vomiting and diarrhea. Genitourinary: Negative for dysuria. Musculoskeletal: Negative for back pain. Skin: Negative for rash. Neurological: Negative for headache.  ____________________________________________   PHYSICAL EXAM:  VITAL SIGNS: ED Triage Vitals  Enc Vitals Group     BP 07/11/18 1241 132/79     Pulse Rate 07/11/18 1241 82     Resp 07/11/18 1241 18     Temp 07/11/18 1241 98.6 F (37 C)     Temp Source 07/11/18 1241 Oral     SpO2 07/11/18 1241 99 %     Weight 07/11/18 1243 286 lb 9.6 oz (130 kg)     Height 07/11/18 1243 4' 1"  (1.245 m)     Head Circumference --      Peak Flow --      Pain Score 07/11/18 1242 6     Pain Loc --      Pain Edu? --      Excl. in [place]? --      Constitutional: Alert and oriented.  HEENT      Head: Normocephalic and atraumatic.      Eyes: Conjunctivae are normal. Pupils equal and round.       Ears:         Nose: No congestion/rhinnorhea.       Mouth/Throat: Mucous membranes are moist.      Neck: No stridor. Cardiovascular/Chest: Normal rate, regular rhythm.  No murmurs, rubs, or gallops. Respiratory: Normal respiratory effort without tachypnea nor retractions. Breath sounds are clear and equal bilaterally. No wheezes/rales/rhonchi. Gastrointestinal: Soft. No distention, no guarding, no rebound. Nontender.  Morbidly obese Genitourinary/rectal:Deferred Musculoskeletal: Nontender with normal range of motion in all extremities. No joint effusions.  No lower extremity tenderness.  No edema. Neurologic: No facial droop.  Normal speech and language.  Seizure right face,  right arm and right leg.  Able to hold her right arm up to count of 10 seconds, although reports weakness of the right arm.  Only able to hold her right lower extremity up against gravity for about 3 seconds.  Left arm and leg strength are intact. Skin:  Skin is warm, dry and intact. No rash noted. Psychiatric: Mood and affect are normal. Speech and behavior are normal. Patient exhibits appropriate insight and judgment.   ____________________________________________  LABS (pertinent positives/negatives) I, Lisa Roca, MD the attending physician have reviewed the labs noted below.  Labs Reviewed  GLUCOSE, CAPILLARY  ETHANOL  PROTIME-INR  APTT  CBC  DIFFERENTIAL  COMPREHENSIVE METABOLIC PANEL  TROPONIN I  URINE DRUG SCREEN, QUALITATIVE ([facility] ONLY)  URINALYSIS, ROUTINE W REFLEX MICROSCOPIC  POC URINE PREG, ED    ____________________________________________    EKG I, Lisa Roca, MD, the attending physician have personally viewed and interpreted all ECGs.  78 bpm.  Normal sinus rhythm.  Narrow QS per normal axis.  Normal ST and T wave ____________________________________________  RADIOLOGY   CT without contrast:  IMPRESSION: 1. Normal noncontrast CT appearance of the brain. 2. ASPECTS is  10. __________________________________________  PROCEDURES  Procedure(s) performed: None  Procedures  Critical Care performed: CRITICAL CARE Performed by: Lisa Roca   Total critical care time: 60 minutes  Critical care time was exclusive of separately billable procedures and treating other patients.  Critical care was necessary to treat or prevent imminent or life-threatening deterioration.  Critical care was time spent personally by me on the following activities: development of treatment plan with patient and/or surrogate as well as nursing, discussions with consultants, evaluation of patient's response to treatment, examination of patient, obtaining history from patient or surrogate, ordering and performing treatments and interventions, ordering and review of laboratory studies, ordering and review of radiographic studies, pulse oximetry and re-evaluation of patient's condition.    ____________________________________________  ED COURSE / ASSESSMENT AND PLAN  Pertinent labs & imaging results that were available during my care of the patient were reviewed by me and considered in my medical decision making (see chart for details).     As soon as I saw the patient, it became clear that rather than arm pain it was actually a right sided arm weakness and right leg weakness concerning for stroke.  Although there are some atypical features including her description of pain in the right shoulder and right arm, does not seem like ACS, or dissection.  I am most concerned about stroke.  Patient was made code stroke.  Head CT was read as negative.  Neurologist on-call did discuss risk and benefit of TPA with the patient as well as her mother and husband in the room, and patient decided that she would like to proceed with TPA.  Blood pressures have been normotensive.  Discussed with Dr. Irish Elders whether to keep the patient here in the ICU versus transfer, and patient was  recommended to stay here.  Reevaluation around 315, no improvement, certainly no worsening condition.  Will be admitted to hospitalist service to the ICU.    CONSULTATIONS: Specialist on-call neurology, at bedside for evaluation and discussion regarding TPA, and with patient discussion recommended TPA.  I spoke with neurologist on-call at [facility], Dr. Irish Elders who recommends ICU admission here at [facility].  Hospice for admission.   Patient / Family / Caregiver informed of clinical course, medical decision-making process, and agree with plan.    ___________________________________________   FINAL CLINICAL IMPRESSION(S) / ED  DIAGNOSES   Final diagnoses:  Right arm weakness  Right leg weakness  Numbness and tingling of right face  Numbness and tingling of right arm and leg      ___________________________________________         Note: This dictation was prepared with Dragon dictation. Any transcriptional errors that result from this process are unintentional    Lisa Roca, MD 07/11/18 1513

## 2018-07-11 NOTE — Progress Notes (Signed)
   07/11/18 1351  Clinical Encounter Type  Visited With Family  Visit Type Follow-up   Chaplain followed back up, patient in room.  Patient became tearful and patient's mother encouraged patient's spouse to comfort patient.  Mother asked chaplain to step out of room with her.  Mother began making calls.  Spouse stepped out of room, declined visit at present.

## 2018-07-11 NOTE — Code Documentation (Signed)
Pt arrives via EMS from work, at 0800 pt had sudden onset of numbness in her right hand to the point of dropping the stapler she was holding, the numbness lasted 15 mins and then resolved, at 1100 the numbness returned and stayed, code stroke activated in ED, after Ct pt taken to room 14, initial NIHSS of 3 with right sided weakness and numbness, pt offered tPA, Dr. Reita Cliche at bedside, pt deliberated on tPA decision for several minutes with family, pt consented to tPA, bolus given at 1345, family at bedside, neuro checks Q 62m, tPA stopped at 1445, report off to [facility]

## 2018-07-11 NOTE — ED Notes (Signed)
Report given to receiving nurse. Patient to ICU

## 2018-07-11 NOTE — ED Notes (Addendum)
Patient sitting up in bed ob phone speaking clear full sentences. Reports pain that began yesterday to right arm and numbness to right lleg. NIH Negative. Patient denies injury, trauma, fall or heavy lifting. Md to eval and plan care. Friends and family at bedside.

## 2018-07-11 NOTE — ED Notes (Signed)
EMS/lobby

## 2018-07-11 NOTE — Consult Note (Signed)
[facility] TeleNeurology Consult Services   TeleStroke Metrics: LKW: 1100 Door Time: 1223  [facility] Contacted: 5188 [facility] at Bedside: 1321 NIHSS (assessment time): 4166   Interventional Candidate: Not a candidate as her symptoms are not consistent with a large vessel proximal occlusion.   Chief Complaint: Right-sided numbness, weakness, and pain   HPI: Asked to see this patient in emergent telemedicine consultation utilizing interactive audio and video technologies. ?Consultation was performed with assistance of ancillary / medical staff at bedside.   Verbal consent to perform the examination with telemedicine was obtained. Patient agreed to proceed with the consultation for acute stroke protocol.   47 year old left-handed African-American female who was brought to the emergency room by family for recurrent right-sided weakness and pain and numbness.  Patient does not take any aspirin at baseline.  Patient states over the last 1 month, she has been having intermittent episodes of right arm and leg numbness and pain.  Then around 8 AM this morning, she was at work and had a 15-minute episode of right hand weakness and numbness where she dropped her stapler.  This ultimately resolved.  Then around 11 AM, she again had acute right arm numbness and weakness but with associated pain as well.  This also involved her right leg.  She also reports right face numbness.  Patient did note having numbness and pain in her right hand.  She did develop left hand cramping after head CT today.  She denies any recent neck trauma.  She denies any definitive neck pain, but notes some shoulder discomfort.   Alteplase (tPA) Administration: Risk and benefit of IV Alteplase were discussed. Risk includes a 6% chance of symptomatic intracranial hemorrhage. Benefit includes an approximate 30% chance of improving at 3 months with the medication versus a 20% chance of improving at 3 months without the  medication. Inclusion criteria were reviewed. Exclusion criteria were reviewed and are all negative.  No recent issues with internal bleeding.  No recent surgeries.   Verbal Consent to Alteplase (tPA):    I have explained to the patient and family the nature of the patient's condition, the use of tPA fibrinolytic agent, and the benefits to be reasonably expected compared with alternative approaches. I have discussed the likelihood of major risks or complications of this procedure including (if applicable) but not limited to loss of limb function, brain damage, paralysis, hemorrhage, infection, complications from transfusion of blood components, drug reactions, blood clots and loss of life. I have also indicated that with any procedure there is always the possibility of an unexpected complication. I have explained the risks which include:     Death, Stroke or permanent neurologic injury (paralysis, coma, etc)     Worsening of stroke symptoms from swelling or bleeding in the brain     Bleeding in other parts of the body     Need for blood transfusions to replace blood or clotting factors     Allergic reaction to medications     Other unexpected complications      All questions were answered and the patient and family expressed understanding of the treatment plan and consented to the procedure.    Verbal Consent/Order: 1339 Alteplase Ordered: 1341 Alteplase Total Dose: 90 mg (weight 130 kg) Alteplase IV Bolus Dose: 9 mg given at 1345 Alteplase IV Infusion Dose: 81 mg started thereafter   Vial of Alteplase was visually confirmed by myself. Dosing of IV Alteplase was reviewed by myself and with the ER nurse prior to Alteplase  administration. Blood pressure Pre-Alteplase Administration: 115/87 at 1344   PMH: Asthma and prior left leg DVT   SOC: Negative x2.  Patient drinks occasional alcohol.  She is married.   [place]: Significant for cancer, diabetes mellitus, and stroke.   ROS: 13 point review  of systems were reviewed with the patient, and are all negative with the exception of the aforementioned in the history of present illness.   VS: Temperature is 98.6 F, pulse 78, respiration 18, blood pressure 121/77, oxygen saturation 97%   Exam: Patient is in no apparent distress.  Patient appears as stated age.  No obvious acute respiratory or cardiac distress.  Patient is well groomed and well-nourished. 1a- LOC: Keenly responsive - 0     1b- LOC questions: Answers both questions correctly - 0    1c- LOC commands- Performs both tasks correctly- 0    2- Gaze: Normal; no gaze paresis or gaze deviation - 0    3- Visual Fields: normal, no Visual field deficit - 0    4- Facial movements: no facial palsy - 0    5- Upper limb motor - right arm drift - 1    6- Lower limb motor - right leg drift - 2     7- Limb Coordination: absent ataxia - 0     8- Sensory: right sensory loss - 1     9- Language - No aphasia - 0     10- Speech - No dysarthria -0    11- Neglect / Extinction - none found - 0   NIHSS score: 4    Diagnostic Data: Blood glucose 87  CT head showed no acute intracranial process   Medical Data Reviewed:   1.Data?reviewed include clinical labs, radiology,?and medical tests;   2.Tests?results discussed w/performing or interpreting physician;   3.Obtaining/reviewing old medical records;   4.Obtaining?case history from another source;   5.Independent?review of image, tracing, or specimen.    Medical Decision Making:   - Extensive number of diagnosis or management options are considered below.   - Extensive amount of complex data reviewed.   - High risk of complication and/or morbidity or mortality are associated with differential diagnostic considerations below.   - There may be?uncertain?outcome and increased probability of prolonged functional impairment or high probability of severe prolonged functional impairment associated with some of these differential diagnosis.     Differential Diagnosis for Stroke:   1.?Cardioembolic?stroke   2. Small vessel disease/lacune   3. Thromboembolic, artery-to-artery mechanism   4.?Hypercoagulable?state-related infarct   5. Transient ischemic attack   6. Thrombotic mechanism, large artery disease    Assessment: 1.  Right-sided weakness/numbness with pain.  Possible left subcortical stroke status post IV alteplase. 2.  Asthma   Recommendations: Patient should be admitted to the inpatient hospitalist service and be monitored in the ICU. Monitor and document vital signs/blood pressure with neuro checks/NIHSS for the first 24 hours after receiving IV Alteplase using the following parameters: -Check every 15 minutes for 2 hours following IV Alteplase administration -Then check every 30 minutes for 6 hours -Then check every 1 hour for 16 hours Allow permissive hypertension. If SBP > 180 or DBP > 105 on 2 consecutive checks, then administer PRN IV anti-hypertensive medication (Labetalol 10-20 mg IV over 1-2 minutes or start Nicardipine drip at 5 mg/hr and can titrate up every 2.5 mg/hr until parameters are achieved) and notify MD. No anti-platelets or Lovenox for 24 hours s/p IV Alteplase per protocol. Place SCDs for DVT  prevention. Repeat head CT in 24 hours s/p IV Alteplase per protocol. Obtain STAT head CT for any new acute headache or new neurological deficits Continue telemetry monitoring to look for paroxysmal atrial fibrillation. Check echocardiogram. Check brain MRI. Check cervical MRI to rule out any acute cervical cord process. Check MRA of the head and neck to better evaluate her intracranial and extracranial blood vessels. Consult PT, OT, and ST. Check hemoglobin A1c and lipid panel. Would recommend to consult local Neurology provider to see patient in follow-up consultation on the floor. Plan of care was discussed with the patient.   Thank you for allowing [facility] to participate in the care of your  patient. Please call me, Dr. Dale Abbott, with any questions at [phone]. Case discussed with the ER staff and Dr. Reita Cliche.   Critical Care notation:   I was called to see this critical patient emergently. I personally evaluated this critical patient for acute stroke evaluation, and determining their eligibility for IV Alteplase and interventional therapies.  I have spent approximately 25 minutes with the patient, including time at bedside, time discussing the case with other physicians, reviewing plan of care, and time independently reviewing the records and scans.

## 2018-07-11 NOTE — ED Notes (Signed)
DR Posey Pronto at bedside to assess and admit to ICU.

## 2018-07-11 NOTE — ED Notes (Signed)
nuero NP at bedside to assess patient.

## 2018-07-11 NOTE — Consult Note (Signed)
Reason for Consult: Monitor in the intensive care unit post TPA for stroke Referring Physician: Dr. Gerhard Munch [name] is an 47 y.o. female.  HPI: [name] is a very pleasant 47 year old African-American female with a past medical history remarkable for asthma, was brought into the emergency department after acute onset of right-sided weakness pain and numbness.  She states that over the past 1 month she has been having some intermittent episodes of right arm and leg numbness and pain.  Around 8 AM this morning she developed an episode at work which lasted 15 minutes and ultimately resolved.  Around 11 AM she again had right arm numbness weakness and pain.  She also started having numbness in her right leg and face.  She was brought to the emergency department, CT scan of the head was negative, neurology consultation was obtained, patient received TPA and will be monitored in the intensive care unit per protocol.  Past Medical History:  Diagnosis Date  . Asthma   . Difficulty breathing   . Family history of breast cancer    BRCA testing letter sent  . Sinus complaint     Past Surgical History:  Procedure Laterality Date  . CESAREAN SECTION  1989  . COLONOSCOPY    . FOOT SURGERY Left 2013  . HERNIA REPAIR      Family History  Problem Relation Age of Onset  . Diabetes Mother   . Uterine cancer Mother   . Colon cancer Mother   . Cancer - Lung Father   . Colon cancer Father   . Diverticulitis Brother   . Hernia Brother        x2 surgeries  . Colon cancer Paternal Grandmother   . Lung cancer Paternal Grandmother     Social History:  reports that she has never smoked. She has never used smokeless tobacco. She reports that she drinks alcohol. She reports that she does not use drugs.  Allergies:  Allergies  Allergen Reactions  . Advil [Ibuprofen] Other (See Comments)    Other Reaction: Other reaction-swells  . Percocet [Oxycodone-Acetaminophen] Nausea And Vomiting  and Other (See Comments)    headaches    Medications: I have reviewed the patient's current medications.  Results for orders placed or performed during the hospital encounter of 07/11/18 (from the past 48 hour(s))  Glucose, capillary     Status: None   Collection Time: 07/11/18  1:05 PM  Result Value Ref Range   Glucose-Capillary 87 70 - 99 mg/dL    Ct Head Code Stroke Wo Contrast  Addendum Date: 07/11/2018   ADDENDUM REPORT: 07/11/2018 13:41 ADDENDUM: Study discussed by telephone with Dr. Lavonia Baird on 07/11/2018 at 1336 hours. Electronically Signed   By: Genevie Ann M.D.   On: 07/11/2018 13:41   Result Date: 07/11/2018 CLINICAL DATA:  Code stroke. 47 year old female with episodes of right extremity pain and numbness beginning yesterday. EXAM: CT HEAD WITHOUT CONTRAST TECHNIQUE: Contiguous axial images were obtained from the base of the skull through the vertex without intravenous contrast. COMPARISON:  None. FINDINGS: Brain: No midline shift, ventriculomegaly, mass effect, evidence of mass lesion, intracranial hemorrhage or evidence of cortically based acute infarction. Gray-white matter differentiation is within normal limits throughout the brain. Normal cerebral volume. Vascular: No suspicious intracranial vascular hyperdensity. Skull: Negative. Sinuses/Orbits: Mild mucosal thickening in the visible paranasal sinuses. The mastoids and tympanic cavities appear well pneumatized. Other: Visualized orbits and scalp soft tissues are within normal limits. ASPECTS [facility] Stroke Program  Early CT Score) - Ganglionic level infarction (caudate, lentiform nuclei, internal capsule, insula, M1-M3 cortex): 7 - Supraganglionic infarction (M4-M6 cortex): 3 Total score (0-10 with 10 being normal): 10 IMPRESSION: 1. Normal noncontrast CT appearance of the brain. 2. ASPECTS is 10. Electronically Signed: By: Genevie Ann M.D. On: 07/11/2018 13:21    ROS  See HPI.  All else negative  Blood pressure 117/74, pulse 82,  temperature 98.6 F (37 C), resp. rate 18, height 4' 1"  (1.245 m), weight 135.2 kg, last menstrual period 05/21/2018, SpO2 99 %.   Physical Exam   Patient is awake alert oriented in no acute distress Vital signs: Please see the above listed vital sign HEENT: Trachea is midline, no thyromegaly appreciated, extraocular muscles intact, patient has some facial asymmetry when smiling has some right facial numbness touch Cardiovascular: Regular rate and rhythm Pulmonary: Clear to auscultation Abdominal: Positive bowel sounds, soft exam Extremities: No clubbing, cyanosis or edema noted Neurologic: Patient has right grip strength weakness, unable to completely close hand, has numbness to touch right leg, right arm, and right side of her face Cutaneous no rashes or lesions noted  Assessment/Plan: Patient status post TPA for acute neurologic symptomatology with negative CT scan of head.  Will follow protocol with 24-hour follow-up CT scan, MRI of brain, neck vessels, echocardiography, neurology consultation we will follow all recommendations per tele-neurology  Hermelinda Dellen, DO  Jonpaul Lumm 07/11/2018, 4:32 PM

## 2018-07-11 NOTE — ED Triage Notes (Signed)
Reports pain and numbness to right shoulder and right leg. denies fall, trauma and  heavy lifting.

## 2018-07-11 NOTE — ED Notes (Signed)
ED TO INPATIENT HANDOFF REPORT  Name/Age/Gender [name] 47 y.o. female  Code Status    Code Status Orders  (From admission, onward)         Start     Ordered   07/11/18 1546  Full code  Continuous     07/11/18 1550        Code Status History    This patient has a current code status but no historical code status.      Home/SNF/Other Patient is from home   Chief Complaint weakness  Level of Care/Admitting Diagnosis ED Disposition    ED Disposition Condition [place]: [facility] [100120]  Level of Care: Stepdown [14]  Diagnosis: CVA (cerebral vascular accident) [facility]) [[id number]]  Admitting Physician: Dustin Flock [[id number]]  Attending Physician: Dustin Flock [[id number]]  Estimated length of stay: past midnight tomorrow  Certification:: I certify this patient will need inpatient services for at least 2 midnights  PT Class (Do Not Modify): Inpatient [101]  PT Acc Code (Do Not Modify): Private [1]       Medical History Past Medical History:  Diagnosis Date  . Asthma   . Difficulty breathing   . Family history of breast cancer    BRCA testing letter sent  . Sinus complaint     Allergies Allergies  Allergen Reactions  . Advil [Ibuprofen] Other (See Comments)    Other Reaction: Other reaction-swells  . Percocet [Oxycodone-Acetaminophen] Nausea And Vomiting and Other (See Comments)    headaches    IV Location/Drains/Wounds Patient Lines/Drains/Airways Status   Active Line/Drains/Airways    Name:   Placement date:   Placement time:   Site:   Days:   Peripheral IV 11/02/17 Left Antecubital   11/02/17    1327    Antecubital   251   Peripheral IV 07/11/18 Right Hand   07/11/18    1353    Hand   less than 1          Labs/Imaging Results for orders placed or performed during the hospital encounter of 07/11/18 (from the past 48 hour(s))  Glucose, capillary     Status: None   Collection Time:  07/11/18  1:05 PM  Result Value Ref Range   Glucose-Capillary 87 70 - 99 mg/dL   Ct Head Code Stroke Wo Contrast  Addendum Date: 07/11/2018   ADDENDUM REPORT: 07/11/2018 13:41 ADDENDUM: Study discussed by telephone with Dr. Lavonia Drafts on 07/11/2018 at 1336 hours. Electronically Signed   By: Genevie Ann M.D.   On: 07/11/2018 13:41   Result Date: 07/11/2018 CLINICAL DATA:  Code stroke. 47 year old female with episodes of right extremity pain and numbness beginning yesterday. EXAM: CT HEAD WITHOUT CONTRAST TECHNIQUE: Contiguous axial images were obtained from the base of the skull through the vertex without intravenous contrast. COMPARISON:  None. FINDINGS: Brain: No midline shift, ventriculomegaly, mass effect, evidence of mass lesion, intracranial hemorrhage or evidence of cortically based acute infarction. Gray-white matter differentiation is within normal limits throughout the brain. Normal cerebral volume. Vascular: No suspicious intracranial vascular hyperdensity. Skull: Negative. Sinuses/Orbits: Mild mucosal thickening in the visible paranasal sinuses. The mastoids and tympanic cavities appear well pneumatized. Other: Visualized orbits and scalp soft tissues are within normal limits. ASPECTS [facility] Stroke Program Early CT Score) - Ganglionic level infarction (caudate, lentiform nuclei, internal capsule, insula, M1-M3 cortex): 7 - Supraganglionic infarction (M4-M6 cortex): 3 Total score (0-10 with 10 being normal): 10 IMPRESSION: 1. Normal noncontrast  CT appearance of the brain. 2. ASPECTS is 10. Electronically Signed: By: Genevie Ann M.D. On: 07/11/2018 13:21    Pending Labs Unresulted Labs (From admission, onward)    Start     Ordered   07/12/18 0500  Hemoglobin A1c  Tomorrow morning,   STAT     07/11/18 1550   07/12/18 0500  Lipid panel  Tomorrow morning,   STAT    Comments:  Fasting    07/11/18 1550   07/11/18 1546  HIV antibody (Routine Testing)  Once,   STAT     07/11/18 1550   07/11/18  1302  Ethanol  ONCE - STAT,   STAT     07/11/18 1302   07/11/18 1302  Protime-INR  (Stroke Panel)  ONCE - STAT,   STAT     07/11/18 1302   07/11/18 1302  APTT  (Stroke Panel)  ONCE - STAT,   STAT     07/11/18 1302   07/11/18 1302  CBC  (Stroke Panel)  ONCE - STAT,   STAT     07/11/18 1302   07/11/18 1302  Differential  (Stroke Panel)  ONCE - STAT,   STAT     07/11/18 1302   07/11/18 1302  Comprehensive metabolic panel  (Stroke Panel)  ONCE - STAT,   STAT     07/11/18 1302   07/11/18 1302  Troponin I - ONCE - STAT  (Stroke Panel)  ONCE - STAT,   STAT     07/11/18 1302   07/11/18 1302  Urine Drug Screen, Qualitative  Once,   STAT     07/11/18 1302   07/11/18 1302  Urinalysis, Routine w reflex microscopic  ONCE - STAT,   STAT     07/11/18 1302          Vitals/Pain Today's Vitals   07/11/18 1500 07/11/18 1515 07/11/18 1530 07/11/18 1550  BP: 118/67   117/74  Pulse: 81 77 79 81  Resp: 18 18 18 18   Temp: 98.1 F (36.7 C) 98.6 F (37 C) 98.6 F (37 C) 98.6 F (37 C)  TempSrc:      SpO2: 96% 100% 98% 99%  Weight:      Height:      PainSc:        Isolation Precautions No active isolations  Medications Medications  labetalol (NORMODYNE,TRANDATE) 5 MG/ML injection (has no administration in time range)   stroke: mapping our early stages of recovery book (has no administration in time range)  0.9 %  sodium chloride infusion ( Intravenous New Bag/Given 07/11/18 1610)  acetaminophen (TYLENOL) tablet 650 mg (has no administration in time range)    Or  acetaminophen (TYLENOL) solution 650 mg (has no administration in time range)    Or  acetaminophen (TYLENOL) suppository 650 mg (has no administration in time range)  senna-docusate (Senokot-S) tablet 1 tablet (has no administration in time range)  pantoprazole (PROTONIX) injection 40 mg (has no administration in time range)  labetalol (NORMODYNE,TRANDATE) injection 20 mg (has no administration in time range)    And  clevidipine  (CLEVIPREX) infusion 0.5 mg/mL (has no administration in time range)  alteplase (ACTIVASE) 1 mg/mL infusion 90 mg (0 mg Intravenous Stopped 07/11/18 1445)    Followed by  0.9 %  sodium chloride infusion (0 mLs Intravenous Stopped 07/11/18 1553)    Mobility Stretcher

## 2018-07-11 NOTE — Consult Note (Addendum)
Referring Physician: Patel\, Sona    Chief Complaint: Right side numbness and weakness  HPI: [name] is an 47 y.o. female with past medical history of asthma presenting to the ED with chief complaints of sudden onset right sided weakness, pain and numbness.  Patient reports that she developed sudden onset of numbness in her right hand causing her to drop things while at work, symptoms lasted 15 minutes then resolved without residual effect.  She had another episode at 11:00 am of numbness but this time the numbness involved her right face and leg.  Symptoms lasted longer than prior episode with cramping sensation in her left hand therefore she presented to the ED for further evaluation. Denies associated altered sensorium, speech abnormality, cranial nerve deficit, seizures, diplopia, nausea or vomiting,  headache, dizziness or other associated focal neurologic deficit.  On arrival to the ED code stroke was initiated and patient evaluated by tele-neurologist.  Initial NIH stroke scale 3.  Initial CT head did not show acute intracranial abnormality.  Due to being within the window period for intervention, tPA was administered with IV bolus dose of 9 mg given at 1345, followed by infusion of 81 mg over an hour.  Patient will be admitted to the ICU for post tPA  monitoring and further stroke work-up.  Date last known well: Date: 07/11/2018 Time last known well: Time: 11:00 tPA Given: Yes  Past Medical History:  Diagnosis Date  . Asthma   . Difficulty breathing   . Family history of breast cancer    BRCA testing letter sent  . Sinus complaint     Past Surgical History:  Procedure Laterality Date  . CESAREAN SECTION  1989  . COLONOSCOPY    . FOOT SURGERY Left 2013  . HERNIA REPAIR      Family History  Problem Relation Age of Onset  . Diabetes Mother   . Uterine cancer Mother   . Colon cancer Mother   . Cancer - Lung Father   . Colon cancer Father   . Diverticulitis Brother    . Hernia Brother        x2 surgeries  . Colon cancer Paternal Grandmother   . Lung cancer Paternal Grandmother    Social History:  reports that she has never smoked. She has never used smokeless tobacco. She reports that she drinks alcohol. She reports that she does not use drugs.  Allergies:  Allergies  Allergen Reactions  . Advil [Ibuprofen] Other (See Comments)    Other Reaction: Other reaction-swells  . Percocet [Oxycodone-Acetaminophen] Nausea And Vomiting and Other (See Comments)    headaches    Medications:  I have reviewed the patient's current medications. Prior to Admission: Prior to Admission medications   Medication Sig Start Date End Date Taking? Authorizing Provider  Albuterol Sulfate (VENTOLIN HFA IN) Inhale into the lungs as needed (for shortness of breath).    Yes [provider]  Cinnamon 500 MG TABS Take 2 tablets by mouth daily.   Yes [provider]  mometasone-formoterol (DULERA) 200-5 MCG/ACT AERO Inhale 2 puffs into the lungs 2 (two) times daily. 01/14/17  Yes Fleming, Herbon E, MD  montelukast (SINGULAIR) 10 MG tablet Take 1 tablet by mouth daily. 04/22/18 04/22/19 Yes Erby Pian, MD  Multiple Vitamins-Calcium (ONE-A-DAY WITHIN PO) Take 1 tablet by mouth daily.   Yes [provider]  Omega 3 1000 MG CAPS Take 1 capsule by mouth daily.   Yes [provider]  vitamin  B-12 (CYANOCOBALAMIN) 1000 MCG tablet Take 1,000 mcg by mouth daily.   Yes [provider]  Vitamin D, Ergocalciferol, (DRISDOL) 1.25 MG (50000 UT) CAPS capsule Take 1 capsule (50,000 Units total) by mouth every 7 (seven) days. For 3 months. 06/20/18  Yes Gae Dry, MD     (Not in a hospital admission) Scheduled: .  stroke: mapping our early stages of recovery book   Does not apply Once  . labetalol      . labetalol  20 mg Intravenous Once  . pantoprazole (PROTONIX) IV  40 mg Intravenous QHS    ROS: History obtained from the patient    General ROS: negative for - chills, fatigue, fever, night sweats, weight gain or weight loss Psychological ROS: negative for - behavioral disorder, hallucinations, memory difficulties, mood swings or suicidal ideation Ophthalmic ROS: negative for - blurry vision, double vision, eye pain or loss of vision ENT ROS: negative for - epistaxis, nasal discharge, oral lesions, sore throat, tinnitus or vertigo Allergy and Immunology ROS: negative for - hives or itchy/watery eyes Hematological and Lymphatic ROS: negative for - bleeding problems, bruising or swollen lymph nodes Endocrine ROS: negative for - galactorrhea, hair pattern changes, polydipsia/polyuria or temperature intolerance Respiratory ROS: negative for - cough, hemoptysis, shortness of breath or wheezing Cardiovascular ROS: negative for - chest pain, dyspnea on exertion, edema or irregular heartbeat Gastrointestinal ROS: negative for - abdominal pain, diarrhea, hematemesis, nausea/vomiting or stool incontinence Genito-Urinary ROS: negative for - dysuria, hematuria, incontinence or urinary frequency/urgency Musculoskeletal ROS: negative for - joint swelling or muscular weakness Neurological ROS: as noted in HPI Dermatological ROS: negative for rash and skin lesion changes  Physical Examination: Blood pressure 117/74, pulse 81, temperature 98.6 F (37 C), resp. rate 18, height 4' 1"  (1.245 m), weight 135.2 kg, last menstrual period 05/21/2018, SpO2 99 %.   HEENT-  Normocephalic, no lesions, without obvious abnormality.  Normal external eye and conjunctiva.  Normal TM's bilaterally.  Normal auditory canals and external ears. Normal external nose, mucus membranes and septum.  Normal pharynx. Cardiovascular- S1, S2 normal, pulses palpable throughout   Lungs- chest clear, no wheezing, rales, normal symmetric air entry Abdomen- soft, non-tender; bowel sounds normal; no masses,  no organomegaly Extremities- no edema Lymph-no adenopathy  palpable Musculoskeletal-no joint tenderness, deformity or swelling Skin-warm and dry, no hyperpigmentation, vitiligo, or suspicious lesions  Neurological Exam   Mental Status: Alert, oriented, thought content appropriate.  Speech fluent without evidence of aphasia.  Able to follow 3 step commands without difficulty. Attention span and concentration seemed appropriate  Cranial Nerves: II: Discs flat bilaterally; Visual fields grossly normal, pupils equal, round, reactive to light and accommodation III,IV, VI: ptosis not present, extra-ocular motions intact bilaterally V,VII: smile symmetric, facial light touch sensation intact VIII: hearing normal bilaterally IX,X: gag reflex present XI: bilateral shoulder shrug XII: midline tongue extension Motor: Right :  Upper extremity   3/5 Without pronator drift      Left: Upper extremity   5/5 without pronator drift Right:   Lower extremity   4/5                                          Left: Lower extremity   5/5 Tone and bulk:normal tone throughout; no atrophy noted Sensory: Pinprick and light touch  decreased on the right Deep Tendon Reflexes: 2+ and symmetric throughout Plantars: Right:  Downgoing                              Left: Downgoing Cerebellar: Finger-to-nose testing intact on the left.  Gait: not tested due to safety concerns  Data Reviewed  Laboratory Studies:  Basic Metabolic Panel: No results for input(s): NA, K, CL, CO2, GLUCOSE, BUN, CREATININE, CALCIUM, MG, PHOS in the last 168 hours.  Liver Function Tests: No results for input(s): AST, ALT, ALKPHOS, BILITOT, PROT, ALBUMIN in the last 168 hours. No results for input(s): LIPASE, AMYLASE in the last 168 hours. No results for input(s): AMMONIA in the last 168 hours.  CBC: No results for input(s): WBC, NEUTROABS, HGB, HCT, MCV, PLT in the last 168 hours.  Cardiac Enzymes: No results for input(s): CKTOTAL, CKMB, CKMBINDEX, TROPONINI in the last 168  hours.  BNP: Invalid input(s): POCBNP  CBG: Recent Labs  Lab 07/11/18 1305  GLUCAP 87    Microbiology: No results found for this or any previous visit.  Coagulation Studies: No results for input(s): LABPROT, INR in the last 72 hours.  Urinalysis: No results for input(s): COLORURINE, LABSPEC, PHURINE, GLUCOSEU, HGBUR, BILIRUBINUR, KETONESUR, PROTEINUR, UROBILINOGEN, NITRITE, LEUKOCYTESUR in the last 168 hours.  Invalid input(s): APPERANCEUR  Lipid Panel:    Component Value Date/Time   CHOL 272 (H) 05/30/2018 1645   TRIG 97 05/30/2018 1645   HDL 90 05/30/2018 1645   CHOLHDL 3.0 05/30/2018 1645   LDLCALC 161 (H) 05/30/2018 1645    HgbA1C:  Lab Results  Component Value Date   HGBA1C 5.3 05/30/2018    Urine Drug Screen:  No results found for: LABOPIA, COCAINSCRNUR, LABBENZ, AMPHETMU, THCU, LABBARB  Alcohol Level: No results for input(s): ETH in the last 168 hours.  Other results: EKG: there are no previous tracings available for comparison.  Imaging: Ct Head Code Stroke Wo Contrast  Addendum Date: 07/11/2018   ADDENDUM REPORT: 07/11/2018 13:41 ADDENDUM: Study discussed by telephone with Dr. Lavonia Drafts on 07/11/2018 at 1336 hours. Electronically Signed   By: Genevie Ann M.D.   On: 07/11/2018 13:41   Result Date: 07/11/2018 CLINICAL DATA:  Code stroke. 47 year old female with episodes of right extremity pain and numbness beginning yesterday. EXAM: CT HEAD WITHOUT CONTRAST TECHNIQUE: Contiguous axial images were obtained from the base of the skull through the vertex without intravenous contrast. COMPARISON:  None. FINDINGS: Brain: No midline shift, ventriculomegaly, mass effect, evidence of mass lesion, intracranial hemorrhage or evidence of cortically based acute infarction. Gray-white matter differentiation is within normal limits throughout the brain. Normal cerebral volume. Vascular: No suspicious intracranial vascular hyperdensity. Skull: Negative. Sinuses/Orbits: Mild  mucosal thickening in the visible paranasal sinuses. The mastoids and tympanic cavities appear well pneumatized. Other: Visualized orbits and scalp soft tissues are within normal limits. ASPECTS [facility] Stroke Program Early CT Score) - Ganglionic level infarction (caudate, lentiform nuclei, internal capsule, insula, M1-M3 cortex): 7 - Supraganglionic infarction (M4-M6 cortex): 3 Total score (0-10 with 10 being normal): 10 IMPRESSION: 1. Normal noncontrast CT appearance of the brain. 2. ASPECTS is 10. Electronically Signed: By: Genevie Ann M.D. On: 07/11/2018 13:21    Assessment: 47 y.o. female with past medical history of asthma presenting to the ED with chief complaints of sudden onset right sided weakness and numbness.  Concerns for ischemic event.  CT head reviewed and showed no acute intracranial abnormality. Due to being within the window period for intervention, tPA was administered with IV bolus dose of  9 mg given at 1345, followed by infusion of 81 mg over an hour.  Patient states that she was not on antiplatelet or anticoagulation prior to this event  Stroke Risk Factors - family history  Plan: 1. MRI, MRA of the brain without contrast 2.Transfer to the  ICU. 3. Vital signs/blood pressure with neuro checks/NIHSS  per tPA protocol for the first 24 hours after receiving IV tPA 4. Allow permissive hypertension. If SBP > 180 or DBP > 105 on 2 consecutive checks, then administer PRN IV anti-hypertensive medication (Labetalol 10-20 mg IV over 1-2 minutes or start Nicardipine drip at 5 mg/hr and can titrate up every 2.5 mg/hr until parameters are achieved) and notify MD. 5.  No anti-platelets or Lovenox for 24 hours s/p IV Alteplase per protocol. 6.  Place SCDs for DVT prevention. 7.  Will repeat head CT in 24 hours s/p IV Alteplase per protocol. 8.  Obtain STAT head CT for any new acute headache or new neurological deficits 9.  HgbA1c, fasting lipid panel 10. PT consult, OT consult, Speech  consult 11. Echocardiogram 12. Carotid dopplers 13. NPO until RN stroke swallow screen 14. Telemetry monitoring  This patient was staffed with Dr. Irish Elders, Alease Frame who personally evaluated patient, reviewed documentation and agreed with assessment and plan of care as above.  Rufina Falco, DNP, FNP-BC Board certified Nurse Practitioner Neurology Department     07/11/2018, 3:58 PM

## 2018-07-11 NOTE — Consult Note (Signed)
CODE STROKE- PHARMACY COMMUNICATION   Time CODE STROKE called/page received:1301  Time response to CODE STROKE was made (in person or via phone):   Time Stroke Kit retrieved from [facility] (only if needed):N/A  Name of Provider/Nurse contacted:Jeannette  Past Medical History:  Diagnosis Date  . Asthma   . Difficulty breathing   . Family history of breast cancer    BRCA testing letter sent  . Sinus complaint    Prior to Admission medications   Medication Sig Start Date End Date Taking? Authorizing Provider  Albuterol Sulfate (VENTOLIN HFA IN) Inhale into the lungs.    [provider]  buPROPion (WELLBUTRIN XL) 150 MG 24 hr tablet Take 1 tablet (150 mg total) by mouth daily. Patient not taking: Reported on 06/21/2018 05/30/18   Steele Sizer, MD  Cinnamon 500 MG TABS Take 2 tablets by mouth daily.    [provider]  Insulin Pen Needle (NOVOFINE) 32G X 6 MM MISC 1 each by Does not apply route daily. 05/30/18   Steele Sizer, MD  Liraglutide -Weight Management (SAXENDA) 18 MG/3ML SOPN Inject 3 mg into the skin every morning. 05/30/18   Steele Sizer, MD  Mepolizumab 100 MG/ML SOAJ Inject into the skin. 05/16/18   [provider]  mometasone-formoterol (DULERA) 200-5 MCG/ACT AERO Inhale 2 puffs into the lungs 2 (two) times daily. 01/14/17   Erby Pian, MD  montelukast (SINGULAIR) 10 MG tablet Take 1 tablet by mouth daily. 04/22/18 04/22/19  Erby Pian, MD  Multiple Vitamins-Calcium (ONE-A-DAY WITHIN PO) Take 1 tablet by mouth daily.    [provider]  norethindrone (MICRONOR,CAMILA,ERRIN) 0.35 MG tablet Take 1 tablet (0.35 mg total) by mouth daily. 05/04/18   Gae Dry, MD  Omega 3 1000 MG CAPS Take 1 capsule by mouth daily.    [provider]  predniSONE (DELTASONE) 10 MG tablet Prednisone 10 mg, 4 pills q day x 2 days, 3 pills q day x 2 days, 2 pills q day x 2 days, 1 pill q day x 2 days. 06/17/18   [provider]   vitamin B-12 (CYANOCOBALAMIN) 1000 MCG tablet Take 1,000 mcg by mouth daily.    [provider]  Vitamin D, Ergocalciferol, (DRISDOL) 1.25 MG (50000 UT) CAPS capsule Take 1 capsule (50,000 Units total) by mouth every 7 (seven) days. For 3 months. 06/20/18   Gae Dry, MD    Paticia Stack ,PharmD Clinical Pharmacist  07/11/2018  2:22 PM

## 2018-07-11 NOTE — H&P (Signed)
[facility] [place] NAME: [name]    MR#:  [id number]  DATE OF BIRTH:  [birth year]  DATE OF ADMISSION:  07/11/2018  PRIMARY CARE PHYSICIAN: Steele Sizer, MD   REQUESTING/REFERRING PHYSICIAN: Lisa Roca MD  CHIEF COMPLAINT:   Chief Complaint  Patient presents with  . Shoulder Pain  . Leg Pain    HISTORY OF PRESENT ILLNESS: [name]  is a 48 y.o. female with a known history of asthma who presented with the complaint of having numbness on the right face arm and leg as well as difficulty moving right upper extremity and right lower extremity.  Patient symptoms started at 11 AM at work.  She was seen in the emergency room by the emergency room physician and a telemetry neurology consult was done.  Vision was within the timeframe for TPA she was initiated on TPA which she has finished now.  Were asked to admit the patient for post TPA monitoring as well as stroke work-up.  Patient states that her right upper extremity right lower extremity weakness has improved since admission.  Does feel that there is difference in sensation between the right side and the left side.    PAST MEDICAL HISTORY:   Past Medical History:  Diagnosis Date  . Asthma   . Difficulty breathing   . Family history of breast cancer    BRCA testing letter sent  . Sinus complaint     PAST SURGICAL HISTORY:  Past Surgical History:  Procedure Laterality Date  . CESAREAN SECTION  1989  . COLONOSCOPY    . FOOT SURGERY Left 2013  . HERNIA REPAIR      SOCIAL HISTORY:  Social History   Tobacco Use  . Smoking status: Never Smoker  . Smokeless tobacco: Never Used  Substance Use Topics  . Alcohol use: Yes    Comment: occasionally    FAMILY HISTORY:  Family History  Problem Relation Age of Onset  . Diabetes Mother   . Uterine cancer Mother   . Colon cancer Mother   . Cancer - Lung Father   . Colon cancer Father   . Diverticulitis Brother    . Hernia Brother        x2 surgeries  . Colon cancer Paternal Grandmother   . Lung cancer Paternal Grandmother     DRUG ALLERGIES:  Allergies  Allergen Reactions  . Advil [Ibuprofen] Other (See Comments)    Other Reaction: Other reaction-swells  . Percocet [Oxycodone-Acetaminophen] Nausea And Vomiting and Other (See Comments)    headaches    REVIEW OF SYSTEMS:   CONSTITUTIONAL: No fever, fatigue or weakness.  EYES: No blurred or double vision.  EARS, NOSE, AND THROAT: No tinnitus or ear pain.  RESPIRATORY: No cough, shortness of breath, wheezing or hemoptysis.  CARDIOVASCULAR: No chest pain, orthopnea, edema.  GASTROINTESTINAL: No nausea, vomiting, diarrhea or abdominal pain.  GENITOURINARY: No dysuria, hematuria.  ENDOCRINE: No polyuria, nocturia,  HEMATOLOGY: No anemia, easy bruising or bleeding SKIN: No rash or lesion. MUSCULOSKELETAL: No joint pain or arthritis.   NEUROLOGIC: Right upper extremity and right lower extremity weakness PSYCHIATRY: No anxiety or depression.   MEDICATIONS AT HOME:  Prior to Admission medications   Medication Sig Start Date End Date Taking? Authorizing Provider  Albuterol Sulfate (VENTOLIN HFA IN) Inhale into the lungs as needed (for shortness of breath).    Yes [provider]  Cinnamon 500 MG TABS Take 2 tablets by mouth  daily.   Yes [provider]  mometasone-formoterol (DULERA) 200-5 MCG/ACT AERO Inhale 2 puffs into the lungs 2 (two) times daily. 01/14/17  Yes Fleming, Herbon E, MD  montelukast (SINGULAIR) 10 MG tablet Take 1 tablet by mouth daily. 04/22/18 04/22/19 Yes Erby Pian, MD  Multiple Vitamins-Calcium (ONE-A-DAY WITHIN PO) Take 1 tablet by mouth daily.   Yes [provider]  Omega 3 1000 MG CAPS Take 1 capsule by mouth daily.   Yes [provider]  vitamin B-12 (CYANOCOBALAMIN) 1000 MCG tablet Take 1,000 mcg by mouth daily.   Yes [provider]  Vitamin D, Ergocalciferol,  (DRISDOL) 1.25 MG (50000 UT) CAPS capsule Take 1 capsule (50,000 Units total) by mouth every 7 (seven) days. For 3 months. 06/20/18  Yes Gae Dry, MD      PHYSICAL EXAMINATION:   VITAL SIGNS: Blood pressure 117/74, pulse 82, temperature 98.6 F (37 C), resp. rate 18, height 4' 1"  (1.245 m), weight 135.2 kg, last menstrual period 05/21/2018, SpO2 99 %.  GENERAL:  47 y.o.-year-old patient lying in the bed with no acute distress.  EYES: Pupils equal, round, reactive to light and accommodation. No scleral icterus. Extraocular muscles intact.  HEENT: Head atraumatic, normocephalic. Oropharynx and nasopharynx clear.  NECK:  Supple, no jugular venous distention. No thyroid enlargement, no tenderness.  LUNGS: Normal breath sounds bilaterally, no wheezing, rales,rhonchi or crepitation. No use of accessory muscles of respiration.  CARDIOVASCULAR: S1, S2 normal. No murmurs, rubs, or gallops.  ABDOMEN: Soft, nontender, nondistended. Bowel sounds present. No organomegaly or mass.  EXTREMITIES: No pedal edema, cyanosis, or clubbing.  NEUROLOGIC: Right upper extremity 4 out of 5 strength right lower extremity 4 out of 5 strength diminished sensation in the right face as well as right lower extremity and right upper extremity PSYCHIATRIC: The patient is alert and oriented x 3.  SKIN: No obvious rash, lesion, or ulcer.   LABORATORY PANEL:   CBC No results for input(s): WBC, HGB, HCT, PLT, MCV, MCH, MCHC, RDW, LYMPHSABS, MONOABS, EOSABS, BASOSABS, BANDABS in the last 168 hours.  Invalid input(s): NEUTRABS, BANDSABD ------------------------------------------------------------------------------------------------------------------  Chemistries  No results for input(s): NA, K, CL, CO2, GLUCOSE, BUN, CREATININE, CALCIUM, MG, AST, ALT, ALKPHOS, BILITOT in the last 168 hours.  Invalid input(s):  GFRCGP ------------------------------------------------------------------------------------------------------------------ CrCl cannot be calculated (Patient's most recent lab result is older than the maximum 21 days allowed.). ------------------------------------------------------------------------------------------------------------------ No results for input(s): TSH, T4TOTAL, T3FREE, THYROIDAB in the last 72 hours.  Invalid input(s): FREET3   Coagulation profile No results for input(s): INR, PROTIME in the last 168 hours. ------------------------------------------------------------------------------------------------------------------- No results for input(s): DDIMER in the last 72 hours. -------------------------------------------------------------------------------------------------------------------  Cardiac Enzymes No results for input(s): CKMB, TROPONINI, MYOGLOBIN in the last 168 hours.  Invalid input(s): CK ------------------------------------------------------------------------------------------------------------------ Invalid input(s): POCBNP  ---------------------------------------------------------------------------------------------------------------  Urinalysis    Component Value Date/Time   COLORURINE YELLOW (A) 10/10/2017 1545   APPEARANCEUR CLOUDY (A) 10/10/2017 1545   APPEARANCEUR Hazy 09/11/2014 0356   LABSPEC 1.017 10/10/2017 1545   LABSPEC 1.025 09/11/2014 0356   PHURINE 6.0 10/10/2017 1545   GLUCOSEU NEGATIVE 10/10/2017 1545   GLUCOSEU Negative 09/11/2014 0356   HGBUR NEGATIVE 10/10/2017 1545   BILIRUBINUR NEGATIVE 10/10/2017 1545   BILIRUBINUR Negative 09/11/2014 0356   KETONESUR NEGATIVE 10/10/2017 1545   PROTEINUR NEGATIVE 10/10/2017 1545   NITRITE NEGATIVE 10/10/2017 1545   LEUKOCYTESUR NEGATIVE 10/10/2017 1545   LEUKOCYTESUR Negative 09/11/2014 0356     RADIOLOGY: Ct Head Code Stroke Wo Contrast  Addendum  Date: 07/11/2018   ADDENDUM REPORT:  07/11/2018 13:41 ADDENDUM: Study discussed by telephone with Dr. Lavonia Drafts on 07/11/2018 at 1336 hours. Electronically Signed   By: Genevie Ann M.D.   On: 07/11/2018 13:41   Result Date: 07/11/2018 CLINICAL DATA:  Code stroke. 47 year old female with episodes of right extremity pain and numbness beginning yesterday. EXAM: CT HEAD WITHOUT CONTRAST TECHNIQUE: Contiguous axial images were obtained from the base of the skull through the vertex without intravenous contrast. COMPARISON:  None. FINDINGS: Brain: No midline shift, ventriculomegaly, mass effect, evidence of mass lesion, intracranial hemorrhage or evidence of cortically based acute infarction. Gray-white matter differentiation is within normal limits throughout the brain. Normal cerebral volume. Vascular: No suspicious intracranial vascular hyperdensity. Skull: Negative. Sinuses/Orbits: Mild mucosal thickening in the visible paranasal sinuses. The mastoids and tympanic cavities appear well pneumatized. Other: Visualized orbits and scalp soft tissues are within normal limits. ASPECTS [facility] Stroke Program Early CT Score) - Ganglionic level infarction (caudate, lentiform nuclei, internal capsule, insula, M1-M3 cortex): 7 - Supraganglionic infarction (M4-M6 cortex): 3 Total score (0-10 with 10 being normal): 10 IMPRESSION: 1. Normal noncontrast CT appearance of the brain. 2. ASPECTS is 10. Electronically Signed: By: Genevie Ann M.D. On: 07/11/2018 13:21    EKG: Orders placed or performed during the hospital encounter of 07/11/18  . ED EKG  . ED EKG    IMPRESSION AND PLAN: Patient is 47 year old presenting with acute stroke  1.  Acute stroke Status post TPA We will proceed with post TPA care in the stepdown unit as per orders had created by neurology service I have discussed with Dr. Irish Elders of neurology who will have nurse practitioner evaluate the patient. Will obtain a stat MRI MRA of the brain carotid Dopplers and echocardiogram per stroke  protocol.  2.  Asthma currently not active Use PRN nebulizers  All the records are reviewed and case discussed with ED provider. Management plans discussed with the patient, family and they are in agreement.  CODE STATUS:    Code Status Orders  (From admission, onward)         Start     Ordered   07/11/18 1546  Full code  Continuous     07/11/18 1550        Code Status History    This patient has a current code status but no historical code status.       TOTAL TIME TAKING CARE OF THIS PATIENT: 55 minutes critical care time   Dustin Flock M.D on 07/11/2018 at 4:43 PM  Between 7am to 6pm - Pager - [phone]  After 6pm go to [email or web address] - password [email or web address]  [facility] [facility]  [phone]  CC: Primary care physician; Steele Sizer, MD

## 2018-07-11 NOTE — Progress Notes (Signed)
   07/11/18 1305  Clinical Encounter Type  Visited With Health care provider;Family  Visit Type Code  Spiritual Encounters  Spiritual Needs Emotional   Chaplain responded to code stroke.  Chaplain checked in with staff prior to following up with family, patient in [place].  Chaplain met patient's mother and husband, offered emotional support and educated as to Clinical biochemist availability.  Patient's mother indicated that chaplain could check back in later.

## 2018-07-12 ENCOUNTER — Inpatient Hospital Stay: Payer: BLUE CROSS/BLUE SHIELD

## 2018-07-12 ENCOUNTER — Inpatient Hospital Stay
Admit: 2018-07-12 | Discharge: 2018-07-12 | Disposition: A | Discharge status: Home / Self Care | Payer: BLUE CROSS/BLUE SHIELD | Attending: Internal Medicine | Admitting: Internal Medicine

## 2018-07-12 LAB — COMPREHENSIVE METABOLIC PANEL
ALK PHOS: 68 U/L (ref 38–126)
ALT: 12 U/L (ref 0–44)
AST: 19 U/L (ref 15–41)
Albumin: 3.3 g/dL — ABNORMAL LOW (ref 3.5–5.0)
Anion gap: 9 (ref 5–15)
BUN: 17 mg/dL (ref 6–20)
CO2: 21 mmol/L — AB (ref 22–32)
Calcium: 8.6 mg/dL — ABNORMAL LOW (ref 8.9–10.3)
Chloride: 107 mmol/L (ref 98–111)
Creatinine, Ser: 0.85 mg/dL (ref 0.44–1.00)
GFR calc non Af Amer: >60 mL/min (ref >60)
Glucose, Bld: 128 mg/dL — ABNORMAL HIGH (ref 70–99)
Potassium: 4.1 mmol/L (ref 3.5–5.1)
Sodium: 137 mmol/L (ref 135–145)
Total Bilirubin: 0.3 mg/dL (ref 0.3–1.2)
Total Protein: 6.5 g/dL (ref 6.5–8.1)

## 2018-07-12 LAB — DIFFERENTIAL
Abs Immature Granulocytes: 0.07 10*3/uL (ref 0.00–0.07)
Basophils Absolute: 0.1 10*3/uL (ref 0.0–0.1)
Basophils Relative: 1 %
Eosinophils Absolute: 0.9 10*3/uL — ABNORMAL HIGH (ref 0.0–0.5)
Eosinophils Relative: 7 %
Immature Granulocytes: 1 %
Lymphocytes Relative: 27 %
Lymphs Abs: 3.5 10*3/uL (ref 0.7–4.0)
MONO ABS: 0.7 10*3/uL (ref 0.1–1.0)
Monocytes Relative: 5 %
Neutro Abs: 7.8 10*3/uL — ABNORMAL HIGH (ref 1.7–7.7)
Neutrophils Relative %: 59 %

## 2018-07-12 LAB — CBC
HCT: 39.3 % (ref 36.0–46.0)
Hemoglobin: 12.8 g/dL (ref 12.0–15.0)
MCH: 29 pg (ref 26.0–34.0)
MCHC: 32.6 g/dL (ref 30.0–36.0)
MCV: 88.9 fL (ref 80.0–100.0)
Platelets: 441 10*3/uL — ABNORMAL HIGH (ref 150–400)
RBC: 4.42 MIL/uL (ref 3.87–5.11)
RDW: 14.1 % (ref 11.5–15.5)
WBC: 13 10*3/uL — ABNORMAL HIGH (ref 4.0–10.5)
nRBC: 0 % (ref 0.0–0.2)

## 2018-07-12 LAB — ETHANOL: Alcohol, Ethyl (B): <10 mg/dL (ref <10)

## 2018-07-12 LAB — ECHOCARDIOGRAM COMPLETE
HEIGHTINCHES: 59 in
Weight: 4800 oz

## 2018-07-12 LAB — APTT: aPTT: 31 seconds (ref 24–36)

## 2018-07-12 LAB — TROPONIN I: Troponin I: <0.03 ng/mL (ref <0.03)

## 2018-07-12 LAB — PROTIME-INR
INR: 1.14
Prothrombin Time: 14.5 seconds (ref 11.4–15.2)

## 2018-07-12 LAB — TRIGLYCERIDES: Triglycerides: 82 mg/dL (ref <150)

## 2018-07-12 MED ORDER — LORAZEPAM 2 MG/ML IJ SOLN
0.5000 mg | Freq: Once | INTRAMUSCULAR | Status: AC
  Administered 2018-07-12: 0.5 mg via INTRAVENOUS
  Filled 2018-07-12: qty 1

## 2018-07-12 MED ORDER — ALPRAZOLAM 0.25 MG PO TABS
0.2500 mg | ORAL_TABLET | ORAL | Status: AC
  Administered 2018-07-12: 0.25 mg via ORAL
  Filled 2018-07-12: qty 1

## 2018-07-12 MED ORDER — ATORVASTATIN CALCIUM 20 MG PO TABS
40.0000 mg | ORAL_TABLET | Freq: Every day | ORAL | Status: DC
  Administered 2018-07-12: 40 mg via ORAL
  Filled 2018-07-12: qty 2

## 2018-07-12 MED ORDER — VITAMIN D (ERGOCALCIFEROL) 1.25 MG (50000 UNIT) PO CAPS
50000.0000 [IU] | ORAL_CAPSULE | ORAL | Status: DC
  Administered 2018-07-12: 50000 [IU] via ORAL
  Filled 2018-07-12: qty 1

## 2018-07-12 MED ORDER — PANTOPRAZOLE SODIUM 40 MG PO TBEC
40.0000 mg | DELAYED_RELEASE_TABLET | Freq: Every day | ORAL | Status: DC
  Administered 2018-07-12: 40 mg via ORAL
  Filled 2018-07-12: qty 1

## 2018-07-12 MED ORDER — GADOBUTROL 1 MMOL/ML IV SOLN
10.0000 mL | Freq: Once | INTRAVENOUS | Status: AC | PRN
  Administered 2018-07-12: 10 mL via INTRAVENOUS

## 2018-07-12 NOTE — Progress Notes (Signed)
*  PRELIMINARY RESULTS* Echocardiogram 2D Echocardiogram has been performed.  [name] 07/12/2018, 12:30 PM

## 2018-07-12 NOTE — Progress Notes (Signed)
SLP Cancellation Note  Patient Details Name: [name] MRN: [id number] DOB: [birth year]   Cancelled treatment:       Reason Eval/Treat Not Completed: SLP screened, no needs identified, will sign off(chart reviewed; consulted NSG then met w/ pt in room). Pt denied any difficulty swallowing and is currently on a regular diet; tolerates swallowing pills w/ water per NSG. Pt conversed at conversational level w/ others in room and SLP w/out deficits noted; pt and Husband denied any speech-language deficits.  No further skilled ST services indicated as pt appears at her baseline. Pt agreed. NSG to reconsult if any change in status.    Orinda Kenner, MS, CCC-SLP [name] 07/12/2018, 9:53 AM

## 2018-07-12 NOTE — Progress Notes (Signed)
Follow up - Critical Care Medicine Note  Patient Details:    [name] is an 47 y.o. female. with a past medical history remarkable for asthma, was brought into the emergency department after acute onset of right-sided weakness pain and numbness.  She states that over the past 1 month she has been having some intermittent episodes of right arm and leg numbness and pain.  Around 8 AM this morning she developed an episode at work which lasted 15 minutes and ultimately resolved.  Around 11 AM she again had right arm numbness weakness and pain.  She also started having numbness in her right leg and face.  She was brought to the emergency department, CT scan of the head was negative, neurology consultation was obtained, patient received TPA and will be monitored in the intensive care unit per protocol.  Lines, Airways, Drains:    Anti-infectives:  Anti-infectives (From admission, onward)   None      Microbiology: Results for orders placed or performed during the hospital encounter of 07/11/18  MRSA PCR Screening     Status: None   Collection Time: 07/11/18  5:20 PM  Result Value Ref Range Status   MRSA by PCR NEGATIVE NEGATIVE Final    Comment:        The GeneXpert MRSA Assay (FDA approved for NASAL specimens only), is one component of a comprehensive MRSA colonization surveillance program. It is not intended to diagnose MRSA infection nor to guide or monitor treatment for MRSA infections. Performed at [facility], [address] [place], [place] [address]     Studies: Mr [name] ZS Contrast  Result Date: 07/12/2018 CLINICAL DATA:  RIGHT extremity pain and numbness since yesterday. Follow-up code stroke. EXAM: MRI HEAD WITHOUT AND WITH CONTRAST MRA HEAD WITHOUT CONTRAST TECHNIQUE: Multiplanar, multiecho pulse sequences of the brain and surrounding structures were obtained without and with intravenous contrast. Angiographic images of the head were obtained  using MRA technique without contrast. CONTRAST:  10 cc Gadavist COMPARISON:  CT HEAD July 11, 2018 FINDINGS: MRI HEAD FINDINGS-moderate motion degraded axial T1 post gadolinium. INTRACRANIAL CONTENTS: No reduced diffusion to suggest acute ischemia or hyperacute demyelination. No susceptibility artifact to suggest hemorrhage. The ventricles and sulci are normal for patient's age. No suspicious parenchymal signal, masses, mass effect. No abnormal extra-axial fluid collections. No extra-axial masses. VASCULAR: Normal major intracranial vascular flow voids present at skull base. SKULL AND UPPER CERVICAL SPINE: No abnormal sellar expansion. No suspicious calvarial bone marrow signal. Susceptibility artifact related to convexity nonenhancing extra-axial calcifications. Craniocervical junction maintained. SINUSES/ORBITS: Mild paranasal sinus mucosal thickening, sphenoid sinus air-fluid level and bilateral maxillary sinus mucosal retention cysts. Mildly patulous optic nerve sheath associated with increased intracranial hypertension, no corroborated findings. OTHER: None. MRA HEAD FINDINGS ANTERIOR CIRCULATION: Normal flow related enhancement of the included cervical, petrous, cavernous and supraclinoid internal carotid arteries. Patent anterior communicating artery. Patent anterior and middle cerebral arteries. No large vessel occlusion, flow limiting stenosis, aneurysm. POSTERIOR CIRCULATION: Codominant vertebral arteries. Vertebrobasilar arteries are patent, with normal flow related enhancement of the main branch vessels. Patent posterior cerebral arteries. Small bilateral posterior communicating arteries present. No large vessel occlusion, flow limiting stenosis,  aneurysm. ANATOMIC VARIANTS: None. Source images and MIP images were reviewed. IMPRESSION: 1. Normal MRI head with and without contrast. 2. Normal MRA head without contrast. Electronically Signed   By: Elon Alas M.D.   On: 07/12/2018 05:27   Mr [name]  Head Wo Contrast  Result Date:  07/12/2018 CLINICAL DATA:  RIGHT extremity pain and numbness since yesterday. Follow-up code stroke. EXAM: MRI HEAD WITHOUT AND WITH CONTRAST MRA HEAD WITHOUT CONTRAST TECHNIQUE: Multiplanar, multiecho pulse sequences of the brain and surrounding structures were obtained without and with intravenous contrast. Angiographic images of the head were obtained using MRA technique without contrast. CONTRAST:  10 cc Gadavist COMPARISON:  CT HEAD July 11, 2018 FINDINGS: MRI HEAD FINDINGS-moderate motion degraded axial T1 post gadolinium. INTRACRANIAL CONTENTS: No reduced diffusion to suggest acute ischemia or hyperacute demyelination. No susceptibility artifact to suggest hemorrhage. The ventricles and sulci are normal for patient's age. No suspicious parenchymal signal, masses, mass effect. No abnormal extra-axial fluid collections. No extra-axial masses. VASCULAR: Normal major intracranial vascular flow voids present at skull base. SKULL AND UPPER CERVICAL SPINE: No abnormal sellar expansion. No suspicious calvarial bone marrow signal. Susceptibility artifact related to convexity nonenhancing extra-axial calcifications. Craniocervical junction maintained. SINUSES/ORBITS: Mild paranasal sinus mucosal thickening, sphenoid sinus air-fluid level and bilateral maxillary sinus mucosal retention cysts. Mildly patulous optic nerve sheath associated with increased intracranial hypertension, no corroborated findings. OTHER: None. MRA HEAD FINDINGS ANTERIOR CIRCULATION: Normal flow related enhancement of the included cervical, petrous, cavernous and supraclinoid internal carotid arteries. Patent anterior communicating artery. Patent anterior and middle cerebral arteries. No large vessel occlusion, flow limiting stenosis, aneurysm. POSTERIOR CIRCULATION: Codominant vertebral arteries. Vertebrobasilar arteries are patent, with normal flow related enhancement of the main branch vessels. Patent  posterior cerebral arteries. Small bilateral posterior communicating arteries present. No large vessel occlusion, flow limiting stenosis,  aneurysm. ANATOMIC VARIANTS: None. Source images and MIP images were reviewed. IMPRESSION: 1. Normal MRI head with and without contrast. 2. Normal MRA head without contrast. Electronically Signed   By: Elon Alas M.D.   On: 07/12/2018 05:27   Mm 3d Screen Breast Bilateral  Result Date: 06/27/2018 CLINICAL DATA:  Screening. EXAM: DIGITAL SCREENING BILATERAL MAMMOGRAM WITH TOMO AND CAD COMPARISON:  None. ACR Breast Density Category b: There are scattered areas of fibroglandular density. FINDINGS: There are no findings suspicious for malignancy. Images were processed with CAD. IMPRESSION: No mammographic evidence of malignancy. A result letter of this screening mammogram will be mailed directly to the patient. RECOMMENDATION: Screening mammogram in one year. (Code:SM-B-01Y) BI-RADS CATEGORY  1: Negative. Electronically Signed   By: [name] M.D.   On: 06/27/2018 07:48   Ct Head Code Stroke Wo Contrast  Addendum Date: 07/11/2018   ADDENDUM REPORT: 07/11/2018 13:41 ADDENDUM: Study discussed by telephone with Dr. Lavonia Drafts on 07/11/2018 at 1336 hours. Electronically Signed   By: Genevie Ann M.D.   On: 07/11/2018 13:41   Result Date: 07/11/2018 CLINICAL DATA:  Code stroke. 47 year old female with episodes of right extremity pain and numbness beginning yesterday. EXAM: CT HEAD WITHOUT CONTRAST TECHNIQUE: Contiguous axial images were obtained from the base of the skull through the vertex without intravenous contrast. COMPARISON:  None. FINDINGS: Brain: No midline shift, ventriculomegaly, mass effect, evidence of mass lesion, intracranial hemorrhage or evidence of cortically based acute infarction. Gray-white matter differentiation is within normal limits throughout the brain. Normal cerebral volume. Vascular: No suspicious intracranial vascular hyperdensity.  Skull: Negative. Sinuses/Orbits: Mild mucosal thickening in the visible paranasal sinuses. The mastoids and tympanic cavities appear well pneumatized. Other: Visualized orbits and scalp soft tissues are within normal limits. ASPECTS [facility] Stroke Program Early CT Score) - Ganglionic level infarction (caudate, lentiform nuclei, internal capsule, insula, M1-M3 cortex): 7 - Supraganglionic infarction (M4-M6 cortex): 3 Total score (0-10 with 10 being normal):  10 IMPRESSION: 1. Normal noncontrast CT appearance of the brain. 2. ASPECTS is 10. Electronically Signed: By: Genevie Ann M.D. On: 07/11/2018 13:21    Consults: Treatment Team:  Catarina Hartshorn, MD   Subjective:    Overnight Issues: Patient has done well overnight.  She states that she has some improved strength in her right hand but still is complaining of some facial arm and leg numbness  Objective:  Vital signs for last 24 hours: Temp:  [97.7 F (36.5 C)-98.9 F (37.2 C)] 98.9 F (37.2 C) (12/03 0200) Pulse Rate:  [71-82] 79 (12/03 0700) Resp:  [13-28] 14 (12/03 0700) BP: (100-133)/(52-89) 100/63 (12/03 0700) SpO2:  [93 %-100 %] 98 % (12/03 0700) Weight:  [130 kg-136.1 kg] 136.1 kg (12/03 0455)  Hemodynamic parameters for last 24 hours:    Intake/Output from previous day: 12/02 0701 - 12/03 0700 In: 577.9 [P.O.:480; I.V.:97.9] Out: -   Intake/Output this shift: No intake/output data recorded.  Vent settings for last 24 hours:    Physical Exam:  Patient is awake alert oriented in no acute distress Vital signs:       Please see the above listed vital sign HEENT:           Trachea is midline, no thyromegaly appreciated, extraocular muscles intact, patient has some facial asymmetry when smiling has some right facial numbness touch Cardiovascular:           Regular rate and rhythm Pulmonary:      Clear to auscultation Abdominal:      Positive bowel sounds, soft exam Extremities:     No clubbing, cyanosis or edema  noted Neurologic:      Patient has right grip strength weakness, unable to completely close hand, has numbness to touch right leg, right arm, and right side of her face Cutaneous no rashes or lesions noted  Assessment/Plan:   Patient status post TPA for acute neurologic symptomatology with negative CT scan of head.    MRI of the head with and without contrast is negative.  Pending echo cardiology and follow-up neurology recommendations.  Follow post TPA protocol  Kollin Udell 07/12/2018  *Care during the described time interval was provided by me and/or other providers on the critical care team.  I have reviewed this patient's available data, including medical history, events of note, physical examination and test results as part of my evaluation.

## 2018-07-12 NOTE — Progress Notes (Signed)
Patient transferred to room 106 from ICU

## 2018-07-12 NOTE — Progress Notes (Signed)
OT Cancellation Note  Patient Details Name: [name] MRN: [id number] DOB: July 14, 1971   Cancelled Treatment:    Reason Eval/Treat Not Completed: Patient not medically ready;Active bedrest order. Order received, chart reviewed. Per tPA protocol, will hold OT evaluation 24 hrs from start of tPA administration and pending updated head CT. Will continue to follow acutely and re-attempt OT evaluation at later date/time as medically appropriate.   Jeni Salles, MPH, MS, OTR/L ascom [phone] 07/12/18, 7:49 AM

## 2018-07-12 NOTE — Progress Notes (Signed)
[place] [facility] [place] NAME: [name]    MR#:  [id number]  DATE OF BIRTH:  Dec 15, 1970  SUBJECTIVE:  CHIEF COMPLAINT:   Chief Complaint  Patient presents with  . Shoulder Pain  . Leg Pain   - right sided weakness-improved but persists -Status post TPA.  Follow-up CT this afternoon  REVIEW OF SYSTEMS:  Review of Systems  Constitutional: Negative for chills, fever and malaise/fatigue.  HENT: Negative for congestion, ear discharge, hearing loss and nosebleeds.   Eyes: Negative for blurred vision and double vision.  Respiratory: Negative for cough, shortness of breath and wheezing.   Cardiovascular: Negative for chest pain and palpitations.  Gastrointestinal: Negative for abdominal pain, constipation, diarrhea, nausea and vomiting.  Genitourinary: Negative for dysuria.  Musculoskeletal: Negative for myalgias.  Neurological: Positive for sensory change and focal weakness. Negative for dizziness, seizures and headaches.  Psychiatric/Behavioral: Negative for depression.    DRUG ALLERGIES:   Allergies  Allergen Reactions  . Advil [Ibuprofen] Other (See Comments)    Other Reaction: Other reaction-swells  . Percocet [Oxycodone-Acetaminophen] Nausea And Vomiting and Other (See Comments)    headaches    VITALS:  Blood pressure 93/81, pulse 81, temperature 98.8 F (37.1 C), temperature source Oral, resp. rate 18, height 5\' 1"  (1.549 m), weight 135.2 kg, last menstrual period 05/21/2018, SpO2 99 %.  PHYSICAL EXAMINATION:  Physical Exam   GENERAL:  47 y.o.-year-old patient lying in the bed with no acute distress.  EYES: Pupils equal, round, reactive to light and accommodation. No scleral icterus. Extraocular muscles intact.  HEENT: Head atraumatic, normocephalic. Oropharynx and nasopharynx clear.  NECK:  Supple, no jugular venous distention. No thyroid enlargement, no tenderness.  LUNGS: Normal breath sounds bilaterally, no  wheezing, rales,rhonchi or crepitation. No use of accessory muscles of respiration.  CARDIOVASCULAR: S1, S2 normal. No murmurs, rubs, or gallops.  ABDOMEN: Soft, nontender, nondistended. Bowel sounds present. No organomegaly or mass.  EXTREMITIES: No pedal edema, cyanosis, or clubbing.  NEUROLOGIC: Cranial nerves II through XII are intact. Muscle strength 5/5 in LUE and LLE, 3/5 RLE and 4/5 RUE. Sensation intact except slightly decreased on the right. Gait not checked.  2+ lower extremity edema. PSYCHIATRIC: The patient is alert and oriented x 3.  SKIN: No obvious rash, lesion, or ulcer.    LABORATORY PANEL:   CBC No results for input(s): WBC, HGB, HCT, PLT in the last 168 hours. ------------------------------------------------------------------------------------------------------------------  Chemistries  No results for input(s): NA, K, CL, CO2, GLUCOSE, BUN, CREATININE, CALCIUM, MG, AST, ALT, ALKPHOS, BILITOT in the last 168 hours.  Invalid input(s): GFRCGP ------------------------------------------------------------------------------------------------------------------  Cardiac Enzymes No results for input(s): TROPONINI in the last 168 hours. ------------------------------------------------------------------------------------------------------------------  RADIOLOGY:  Mr Jeri Cos Wo Contrast  Result Date: 07/12/2018 CLINICAL DATA:  RIGHT extremity pain and numbness since yesterday. Follow-up code stroke. EXAM: MRI HEAD WITHOUT AND WITH CONTRAST MRA HEAD WITHOUT CONTRAST TECHNIQUE: Multiplanar, multiecho pulse sequences of the brain and surrounding structures were obtained without and with intravenous contrast. Angiographic images of the head were obtained using MRA technique without contrast. CONTRAST:  10 cc Gadavist COMPARISON:  CT HEAD July 11, 2018 FINDINGS: MRI HEAD FINDINGS-moderate motion degraded axial T1 post gadolinium. INTRACRANIAL CONTENTS: No reduced diffusion to suggest  acute ischemia or hyperacute demyelination. No susceptibility artifact to suggest hemorrhage. The ventricles and sulci are normal for patient's age. No suspicious parenchymal signal, masses, mass effect. No abnormal extra-axial fluid collections. No extra-axial masses. VASCULAR: Normal major  intracranial vascular flow voids present at skull base. SKULL AND UPPER CERVICAL SPINE: No abnormal sellar expansion. No suspicious calvarial bone marrow signal. Susceptibility artifact related to convexity nonenhancing extra-axial calcifications. Craniocervical junction maintained. SINUSES/ORBITS: Mild paranasal sinus mucosal thickening, sphenoid sinus air-fluid level and bilateral maxillary sinus mucosal retention cysts. Mildly patulous optic nerve sheath associated with increased intracranial hypertension, no corroborated findings. OTHER: None. MRA HEAD FINDINGS ANTERIOR CIRCULATION: Normal flow related enhancement of the included cervical, petrous, cavernous and supraclinoid internal carotid arteries. Patent anterior communicating artery. Patent anterior and middle cerebral arteries. No large vessel occlusion, flow limiting stenosis, aneurysm. POSTERIOR CIRCULATION: Codominant vertebral arteries. Vertebrobasilar arteries are patent, with normal flow related enhancement of the main branch vessels. Patent posterior cerebral arteries. Small bilateral posterior communicating arteries present. No large vessel occlusion, flow limiting stenosis,  aneurysm. ANATOMIC VARIANTS: None. Source images and MIP images were reviewed. IMPRESSION: 1. Normal MRI head with and without contrast. 2. Normal MRA head without contrast. Electronically Signed   By: Elon Alas M.D.   On: 07/12/2018 05:27   Mr Jodene Nam Head Wo Contrast  Result Date: 07/12/2018 CLINICAL DATA:  RIGHT extremity pain and numbness since yesterday. Follow-up code stroke. EXAM: MRI HEAD WITHOUT AND WITH CONTRAST MRA HEAD WITHOUT CONTRAST TECHNIQUE: Multiplanar, multiecho  pulse sequences of the brain and surrounding structures were obtained without and with intravenous contrast. Angiographic images of the head were obtained using MRA technique without contrast. CONTRAST:  10 cc Gadavist COMPARISON:  CT HEAD July 11, 2018 FINDINGS: MRI HEAD FINDINGS-moderate motion degraded axial T1 post gadolinium. INTRACRANIAL CONTENTS: No reduced diffusion to suggest acute ischemia or hyperacute demyelination. No susceptibility artifact to suggest hemorrhage. The ventricles and sulci are normal for patient's age. No suspicious parenchymal signal, masses, mass effect. No abnormal extra-axial fluid collections. No extra-axial masses. VASCULAR: Normal major intracranial vascular flow voids present at skull base. SKULL AND UPPER CERVICAL SPINE: No abnormal sellar expansion. No suspicious calvarial bone marrow signal. Susceptibility artifact related to convexity nonenhancing extra-axial calcifications. Craniocervical junction maintained. SINUSES/ORBITS: Mild paranasal sinus mucosal thickening, sphenoid sinus air-fluid level and bilateral maxillary sinus mucosal retention cysts. Mildly patulous optic nerve sheath associated with increased intracranial hypertension, no corroborated findings. OTHER: None. MRA HEAD FINDINGS ANTERIOR CIRCULATION: Normal flow related enhancement of the included cervical, petrous, cavernous and supraclinoid internal carotid arteries. Patent anterior communicating artery. Patent anterior and middle cerebral arteries. No large vessel occlusion, flow limiting stenosis, aneurysm. POSTERIOR CIRCULATION: Codominant vertebral arteries. Vertebrobasilar arteries are patent, with normal flow related enhancement of the main branch vessels. Patent posterior cerebral arteries. Small bilateral posterior communicating arteries present. No large vessel occlusion, flow limiting stenosis,  aneurysm. ANATOMIC VARIANTS: None. Source images and MIP images were reviewed. IMPRESSION: 1. Normal  MRI head with and without contrast. 2. Normal MRA head without contrast. Electronically Signed   By: Elon Alas M.D.   On: 07/12/2018 05:27   Ct Head Code Stroke Wo Contrast  Addendum Date: 07/11/2018   ADDENDUM REPORT: 07/11/2018 13:41 ADDENDUM: Study discussed by telephone with Dr. Lavonia Drafts on 07/11/2018 at 1336 hours. Electronically Signed   By: Genevie Ann M.D.   On: 07/11/2018 13:41   Result Date: 07/11/2018 CLINICAL DATA:  Code stroke. 47 year old female with episodes of right extremity pain and numbness beginning yesterday. EXAM: CT HEAD WITHOUT CONTRAST TECHNIQUE: Contiguous axial images were obtained from the base of the skull through the vertex without intravenous contrast. COMPARISON:  None. FINDINGS: Brain: No midline shift, ventriculomegaly, mass effect, evidence of  mass lesion, intracranial hemorrhage or evidence of cortically based acute infarction. Gray-white matter differentiation is within normal limits throughout the brain. Normal cerebral volume. Vascular: No suspicious intracranial vascular hyperdensity. Skull: Negative. Sinuses/Orbits: Mild mucosal thickening in the visible paranasal sinuses. The mastoids and tympanic cavities appear well pneumatized. Other: Visualized orbits and scalp soft tissues are within normal limits. ASPECTS [facility] Stroke Program Early CT Score) - Ganglionic level infarction (caudate, lentiform nuclei, internal capsule, insula, M1-M3 cortex): 7 - Supraganglionic infarction (M4-M6 cortex): 3 Total score (0-10 with 10 being normal): 10 IMPRESSION: 1. Normal noncontrast CT appearance of the brain. 2. ASPECTS is 10. Electronically Signed: By: Genevie Ann M.D. On: 07/11/2018 13:21    EKG:   Orders placed or performed during the hospital encounter of 07/11/18  . ED EKG  . ED EKG    ASSESSMENT AND PLAN:   47 year old female with past medical history significant for asthma and obesity comes to hospital secondary to right-sided weakness.  1.  Acute  stroke-presented within 3 hours of symptom onset, status post TPA -MRI of the brain is negative.  Echocardiogram with no source of cardiac emboli. -Repeat CTA pending today -PT/OT consults pending -Appreciate neurology consult -Monitor blood pressure. -No antiplatelets or Lovenox for 24 hours after TPA. -LDL is 161 -Severely low vitamin D  2.  Vitamin D deficiency-will order 50,000 units weekly for 12 weeks  3.  Hyperlipidemia-statin  4.  GERD-Protonix  5.  Asthma-stable, continue inhalers  6.  Obesity-weight reduction and lifestyle changes recommended   All the records are reviewed and case discussed with Care Management/Social Workerr. Management plans discussed with the patient, family and they are in agreement.  CODE STATUS: Full code  TOTAL TIME TAKING CARE OF THIS PATIENT: 39 minutes.   POSSIBLE D/C IN 1-2 DAYS, DEPENDING ON CLINICAL CONDITION.   Gladstone Lighter M.D on 07/12/2018 at 2:36 PM  Between 7am to 6pm - Pager - [phone]  After 6pm go to [email or web address] - password [email or web address] [facility]  Office  [phone]  CC: Primary care physician; Steele Sizer, MD

## 2018-07-12 NOTE — Progress Notes (Signed)
PT Cancellation Note  Patient Details Name: Kaelen Brennan MRN: [id number] DOB: 11/25/1970   Cancelled Treatment:    Reason Eval/Treat Not Completed: Medical issues which prohibited therapy(Consult received and chart reviewed. Per notes, patient received tPA, infusion ending at 1445 on 07/11/18.  Per guidelines, to remain on strict bedrest x24 hours and until imaging (CT/MRI) repeated and cleared.  Will hold at this time, but continue to follow and initiate as medically appropriate.)   Wilhelmena Zea H. Owens Shark, PT, DPT, NCS 07/12/18, 9:31 AM [phone]

## 2018-07-13 ENCOUNTER — Inpatient Hospital Stay: Payer: BLUE CROSS/BLUE SHIELD

## 2018-07-13 LAB — URINALYSIS, ROUTINE W REFLEX MICROSCOPIC
Bilirubin Urine: NEGATIVE
GLUCOSE, UA: NEGATIVE mg/dL
Ketones, ur: NEGATIVE mg/dL
LEUKOCYTES UA: NEGATIVE
Nitrite: NEGATIVE
PH: 6 (ref 5.0–8.0)
Protein, ur: NEGATIVE mg/dL
Specific Gravity, Urine: 1.005 (ref 1.005–1.030)

## 2018-07-13 LAB — URINE DRUG SCREEN, QUALITATIVE (ARMC ONLY)
Amphetamines, Ur Screen: NOT DETECTED
Barbiturates, Ur Screen: NOT DETECTED
Benzodiazepine, Ur Scrn: NOT DETECTED
Cannabinoid 50 Ng, Ur ~~LOC~~: NOT DETECTED
Cocaine Metabolite,Ur ~~LOC~~: NOT DETECTED
MDMA (Ecstasy)Ur Screen: NOT DETECTED
Methadone Scn, Ur: NOT DETECTED
Opiate, Ur Screen: NOT DETECTED
Phencyclidine (PCP) Ur S: NOT DETECTED
TRICYCLIC, UR SCREEN: NOT DETECTED

## 2018-07-13 MED ORDER — HEPARIN SODIUM (PORCINE) 5000 UNIT/ML IJ SOLN: 5000.0000 [IU] | Freq: Three times a day (TID) | INTRAMUSCULAR | Status: DC

## 2018-07-13 MED ORDER — ASPIRIN 81 MG PO CHEW: 81.0000 mg | CHEWABLE_TABLET | Freq: Every day | ORAL | Status: DC

## 2018-07-13 MED ORDER — CLOPIDOGREL BISULFATE 75 MG PO TABS: 75.0000 mg | ORAL_TABLET | Freq: Every day | ORAL | Status: DC

## 2018-07-13 MED ORDER — ATORVASTATIN CALCIUM 40 MG PO TABS: 40.0000 mg | ORAL_TABLET | Freq: Every day | ORAL | 1 refills | Status: DC

## 2018-07-13 MED ORDER — ASPIRIN 81 MG PO CHEW: 81.0000 mg | CHEWABLE_TABLET | Freq: Every day | ORAL | 0 refills | Status: AC

## 2018-07-13 NOTE — Evaluation (Signed)
Occupational Therapy Evaluation Patient Details Name: [name] MRN: [id number] DOB: [birth year] Today's Date: 07/13/2018    History of Present Illness [name] is a very pleasant 47 year old African-American female with a past medical history remarkable for asthma, was brought into the emergency department after acute onset of right-sided weakness pain and numbness. Pt received tPA; bed rest 24 hours. Repeat CT normal.    Clinical Impression   Pt seen for OT evaluation this date. Prior to hospital admission, pt was independent in all aspects of ADL/IADL and mobility, and denies falls history in past 12 months. Pt lives with her spouse in a 1 story home with husband available PRN for assist.  While OT stepped out of room to retreive Dynomap at start of session, pt moved from bed to recliner w/o grippy socks and RW but with assistance from mother who was in room. Pt bed alarm was not set upon OT entrance. Pt/family instructed in falls prevention strategies. Pt noted bruising and swelling on BUE from needle sticks; RN notified. Currently pt reporting impairments with sensation, strength, and coordination in RUE and pain in RLE requiring MIN/MOD assist for LB dressing and bathing tasks. Pt stated greater sensation distally in RUE and both sensation and strength are better than when she arrived at ER. Pt also states blurry vision while reading only in L eye. Pt will benefit from skilled OT services following discharge to maximize independence and safety and minimize falls risk and caregiver burden. OT recommends HHOT upon discharge.     Follow Up Recommendations  Home health OT    Equipment Recommendations  None recommended by OT    Recommendations for Other Services       Precautions / Restrictions Precautions Precautions: Fall Precaution Comments: bleeding precautions Restrictions Weight Bearing Restrictions: No      Mobility Bed Mobility Overal bed mobility: Modified  Independent                Transfers Overall transfer level: Needs assistance Equipment used: Rolling walker (2 wheeled) Transfers: Sit to/from Stand Sit to Stand: Min guard         General transfer comment: Pt stated able to lift RLE off floor now as she walks    Balance Overall balance assessment: Modified Independent                                         ADL either performed or assessed with clinical judgement   ADL Overall ADL's : Needs assistance/impaired Eating/Feeding: Independent;Sitting   Grooming: Standing;Supervision/safety Grooming Details (indicate cue type and reason): Pt able to remove cap and apply lotion from R hand onto L arm. Upper Body Bathing: Supervision/ safety;Sitting Upper Body Bathing Details (indicate cue type and reason): Pt stated she was able to clean her upper body during her morning's bath Lower Body Bathing: Moderate assistance;Sitting/lateral leans   Upper Body Dressing : Supervision/safety;Sitting   Lower Body Dressing: Sit to/from stand;Moderate assistance Lower Body Dressing Details (indicate cue type and reason): Pt unable to don sock with R hand onto L foot while seated.  Toilet Transfer: RW;Regular Toilet;Min guard           Functional mobility during ADLs: Rolling walker;Min guard       Vision Baseline Vision/History: Wears glasses Wears Glasses: At all times Patient Visual Report: Blurring of vision(L eye blurry while reading; no impairments with convergence, tracking  or visual fields)       Perception     Praxis      Pertinent Vitals/Pain Pain Assessment: 0-10 Pain Score: 8  Pain Location: RUE Pain Descriptors / Indicators: Constant;Throbbing Pain Intervention(s): Limited activity within patient's tolerance;Monitored during session;Repositioned     Hand Dominance Left   Extremity/Trunk Assessment Upper Extremity Assessment Upper Extremity Assessment: RUE deficits/detail;LUE  deficits/detail RUE Deficits / Details: RUE - grip, shoulder, and bi/tricep 3+/5; 50% AROM in R shoulder RUE Sensation: decreased light touch RUE Coordination: decreased fine motor;decreased gross motor(increased time and effort required for thumb opposition test; pt stated getting better but still decreased sensation in R fingers; less sensation proximally) LUE Deficits / Details: 4/5 grip, shoulder, and bi/triceps   Lower Extremity Assessment Lower Extremity Assessment: Overall WFL for tasks assessed;RLE deficits/detail RLE Deficits / Details: RLE with pain 3+ to 4/5 strength       Communication Communication Communication: No difficulties   Cognition Arousal/Alertness: Awake/alert Behavior During Therapy: WFL for tasks assessed/performed Overall Cognitive Status: Within Functional Limits for tasks assessed                                     General Comments  Bruising and edema noted in BUE 2/2 to needle sticks. RN notified    Exercises Other Exercises Other Exercises: PT/family educated in falls prevention strategies when completing ADL tasks.   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 Pt enjoys shopping.         OT Problem List: Decreased strength;Impaired UE functional use;Impaired sensation;Decreased safety awareness;Decreased coordination;Impaired balance (sitting and/or  standing);Impaired vision/perception;Decreased knowledge of use of DME or AE      OT Treatment/Interventions: Self-care/ADL training;Balance training;Therapeutic exercise;DME and/or AE instruction;Patient/family education;Therapeutic activities    OT Goals(Current goals can be found in the care plan section) Acute Rehab OT Goals Patient Stated Goal: to get home OT Goal Formulation: With patient/family Time For Goal Achievement: 07/27/18 Potential to Achieve Goals: Good ADL Goals Pt Will Perform Lower Body Dressing: with min guard assist;sit to/from stand Additional ADL Goal #1: Pt will independnently complete at least 5 fine motor coordination exercises from HEP handout 3x a day  OT Frequency: Min 1X/week   Barriers to D/C:            Co-evaluation              AM-PAC OT "6 Clicks" Daily Activity     Outcome Measure Help from another person eating meals?: None Help from another person taking care of personal grooming?: None Help from another person toileting, which includes using toliet, bedpan, or urinal?: A Little Help from another person bathing (including washing, rinsing, drying)?: A Little Help from another person to put on and taking off regular upper body clothing?: None Help from another person to put on  and taking off regular lower body clothing?: A Little 6 Click Score: 21   End of Session Equipment Utilized During Treatment: Gait belt;Rolling walker  Activity Tolerance: Patient tolerated treatment well Patient left: in bed;with call bell/phone within reach;with bed alarm set;with family/visitor present  OT Visit Diagnosis: Other abnormalities of gait and mobility (R26.89);Pain Pain - Right/Left: Right Pain - part of body: Leg                Time: 9767-3419 OT Time Calculation (min): 35 min Charges:     Jadene Pierini OTS  07/13/2018, 10:55 AM

## 2018-07-13 NOTE — Progress Notes (Signed)
PT Cancellation Note  Patient Details Name: [name] MRN: [id number] DOB: 1970/12/12   Cancelled Treatment:    Reason Eval/Treat Not Completed: Medical issues which prohibited therapy(Chart reviewed for re-attempt at evaluation.  Patient noted with pending L UE doppler to rule out DVT.  Will hold PT eval pending results and clearance for exertional activity.)   Dietrich Samuelson H. Owens Shark, PT, DPT, NCS 07/13/18, 11:57 AM [phone]

## 2018-07-13 NOTE — Progress Notes (Signed)
TO WHOMSOEVER IT MAY CONCERN: -----------------------------------------------------  Mr. [name] has been at [facility] attending his wife Ms. Eddie McBroom [place] who is hospitalized here from July 11, 2018 to July 13, 2018. Please to excuse him from work for the above time..  Please call if any questions.  Gladstone Lighter, M.D [facility] [address], [place], [place] [address] Ph: [phone]

## 2018-07-13 NOTE — Evaluation (Signed)
Physical Therapy Evaluation Patient Details Name: Tressa Maldonado MRN: [id number] DOB: 09-06-1970 Today's Date: 07/13/2018   History of Present Illness  [name] is a very pleasant 47 year old African-American female with a past medical history remarkable for asthma, was brought into the emergency department after acute onset of right-sided weakness pain and numbness. Pt received tPA; bed rest 24 hours. MRI negative for acute infarct; repeat CT normal.   Clinical Impression  Upon evaluation, patient alert and oriented; follows commands and agreeable to session.  Delayed speed of activation for active movement of R UE > LE, but able to achieve functional ROM with increased time and attention; appropriately resists therapist with strength testing, but does demonstrate considerable give-way weakness to UE and LE with isolated testing.  Able to complete bed mobility with mod indep; sit/stand, basic transfers and gait (75') without assist device, distant sup/mod indep.  Good WBing and closed-chain control to R LE; very inconsistent gait mechanics and performance (changing cadence, weight shift, stepping pattern multiple times during distance), but no overt buckling or LOB. Would benefit from skilled PT to address above deficits and promote optimal return to PLOF; recommend transition to home with outpatient PT follow up as appropriate.    Follow Up Recommendations Outpatient PT    Equipment Recommendations       Recommendations for Other Services       Precautions / Restrictions Precautions Precautions: Fall Restrictions Weight Bearing Restrictions: No      Mobility  Bed Mobility Overal bed mobility: Modified Independent                Transfers Overall transfer level: Modified independent Equipment used: None Transfers: Sit to/from Stand Sit to Stand: Modified independent (Device/Increase time)         General transfer comment: good LE  strength/power  Ambulation/Gait Ambulation/Gait assistance: Modified independent (Device/Increase time);Supervision Gait Distance (Feet): 75 Feet Assistive device: None       General Gait Details: broad BOS, excessive lateral sway, excessive stance time R LE with exaggerated R trunk lean/extension to advance L LE (maintains in slight abduct/ER).  Stepping pattern/performance varies throughout distance, fluctuating in stance time, limb advance and overall mechanics.  Very slow and deliberate, but no buckling, LOB or safety concern.  Refuses trial of any assist device.  Stairs            Wheelchair Mobility    Modified Rankin (Stroke Patients Only)       Balance Overall balance assessment: Needs assistance Sitting-balance support: No upper extremity supported;Feet supported Sitting balance-Leahy Scale: Good     Standing balance support: No upper extremity supported Standing balance-Leahy Scale: Fair                               Pertinent Vitals/Pain Pain Assessment: No/denies pain    Home Living Family/patient expects to be discharged to:: Private residence Living Arrangements: Spouse/significant other Available Help at Discharge: Family;Available PRN/intermittently Type of Home: House Home Access: Level entry     Home Layout: One level Home Equipment: None      Prior Function           Comments: Pt independent with both ADL/IADL tasks and mobility; 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 Pt enjoys shopping.      Hand Dominance   Dominant Hand: Left    Extremity/Trunk Assessment   Upper  Extremity Assessment Upper Extremity Assessment: (R UE at least 4-/5, delayed speed of activation.  Reports mild paresthesia mid forearm to shoulder.  Generally inconsistent presentation.)    Lower Extremity Assessment Lower Extremity Assessment: (R LE at least 4-/5, mild paresthesia reported with  delayed speed of activation; inconsistent performance.  Give way weakness)       Communication   Communication: No difficulties  Cognition Arousal/Alertness: Awake/alert Behavior During Therapy: WFL for tasks assessed/performed Overall Cognitive Status: Within Functional Limits for tasks assessed                                        General Comments      Exercises     Assessment/Plan    PT Assessment Patient needs continued PT services  PT Problem List Decreased strength;Decreased range of motion;Decreased activity tolerance;Decreased balance;Decreased coordination;Decreased mobility;Decreased cognition;Decreased knowledge of use of DME;Decreased safety awareness;Decreased knowledge of precautions;Obesity       PT Treatment Interventions DME instruction;Gait training;Stair training;Functional mobility training;Therapeutic activities;Therapeutic exercise;Balance training;Patient/family education    PT Goals (Current goals can be found in the Care Plan section)  Acute Rehab PT Goals Patient Stated Goal: to get home PT Goal Formulation: With patient/family Time For Goal Achievement: 07/27/18 Potential to Achieve Goals: Good    Frequency Min 2X/week   Barriers to discharge        Co-evaluation               AM-PAC PT "6 Clicks" Mobility  Outcome Measure Help needed turning from your back to your side while in a flat bed without using bedrails?: None Help needed moving from lying on your back to sitting on the side of a flat bed without using bedrails?: None Help needed moving to and from a bed to a chair (including a wheelchair)?: None Help needed standing up from a chair using your arms (e.g., wheelchair or bedside chair)?: None Help needed to walk in hospital room?: None Help needed climbing 3-5 steps with a railing? : A Little 6 Click Score: 23    End of Session Equipment Utilized During Treatment: Gait belt Activity Tolerance: Patient  tolerated treatment well Patient left: in bed;with call bell/phone within reach Nurse Communication: Mobility status PT Visit Diagnosis: Difficulty in walking, not elsewhere classified (R26.2)    Time: 5053-9767 PT Time Calculation (min) (ACUTE ONLY): 15 min   Charges:   PT Evaluation $PT Eval Low Complexity: 1 Low          Jaquay Morneault H. Owens Shark, PT, DPT, NCS 07/13/18, 4:31 PM [phone]

## 2018-07-13 NOTE — Discharge Summary (Signed)
[facility] NAME: [name]    MR#:  [id number]  DATE OF BIRTH:  1971-01-11  DATE OF ADMISSION:  07/11/2018   ADMITTING PHYSICIAN: Dustin Flock, MD  DATE OF DISCHARGE:  07/13/18  PRIMARY CARE PHYSICIAN: Steele Sizer, MD   ADMISSION DIAGNOSIS:   CVA (cerebral vascular accident) [facility]) [I63.9] Right leg weakness [R29.898] Right arm weakness [R29.898] Numbness and tingling of right arm and leg [R20.0, R20.2] Numbness and tingling of right face [R20.0, R20.2]  DISCHARGE DIAGNOSIS:   Active Problems:   CVA (cerebral vascular accident) ([place])   SECONDARY DIAGNOSIS:   Past Medical History:  Diagnosis Date  . Asthma   . Difficulty breathing   . Family history of breast cancer    BRCA testing letter sent  . Sinus complaint     HOSPITAL COURSE:   47 year old female with past medical history significant for asthma and obesity comes to hospital secondary to right-sided weakness.  1.  Acute stroke-presented within 3 hours of symptom onset, status post TPA -MRI of the brain is negative.  Echocardiogram with no source of cardiac emboli. -Repeat CT with no acute findings -PT/OT consults appreciated-needs home health at discharge -Appreciate neurology consult -Monitor blood pressure. -Aspirin at discharge and also statin -Severely low vitamin D  2.  Vitamin D deficiency-continue 50,000 units weekly for 12 weeks  3.  Hyperlipidemia-statin  4.  Asthma-stable, continue inhalers  5.  Obesity-weight reduction and lifestyle changes recommended  Will be discharged home today  DISCHARGE CONDITIONS:   Guarded  CONSULTS OBTAINED:   Treatment Team:  Catarina Hartshorn, MD  DRUG ALLERGIES:   Allergies  Allergen Reactions  . Advil [Ibuprofen] Other (See Comments)    Other Reaction: Other reaction-swells  . Percocet [Oxycodone-Acetaminophen] Nausea And Vomiting and Other (See Comments)    headaches    DISCHARGE MEDICATIONS:   Allergies as of 07/13/2018      Reactions   Advil [ibuprofen] Other (See Comments)   Other Reaction: Other reaction-swells   Percocet [oxycodone-acetaminophen] Nausea And Vomiting, Other (See Comments)   headaches      Medication List    TAKE these medications   aspirin 81 MG chewable tablet Chew 1 tablet (81 mg total) by mouth daily.   atorvastatin 40 MG tablet Commonly known as:  LIPITOR Take 1 tablet (40 mg total) by mouth daily at 6 PM.   Cinnamon 500 MG Tabs Take 2 tablets by mouth daily.   mometasone-formoterol 200-5 MCG/ACT Aero Commonly known as:  DULERA Inhale 2 puffs into the lungs 2 (two) times daily.   montelukast 10 MG tablet Commonly known as:  SINGULAIR Take 1 tablet by mouth daily.   Omega 3 1000 MG Caps Take 1 capsule by mouth daily.   ONE-A-DAY WITHIN PO Take 1 tablet by mouth daily.   VENTOLIN HFA IN Inhale into the lungs as needed (for shortness of breath).   vitamin B-12 1000 MCG tablet Commonly known as:  CYANOCOBALAMIN Take 1,000 mcg by mouth daily.   Vitamin D (Ergocalciferol) 1.25 MG (50000 UT) Caps capsule Commonly known as:  DRISDOL Take 1 capsule (50,000 Units total) by mouth every 7 (seven) days. For 3 months.        DISCHARGE INSTRUCTIONS:   1. PCP f/u in 1-2 weeks 2. Neurology f/u in 2 weeks  DIET:   Cardiac diet  ACTIVITY:   Activity as tolerated  OXYGEN:   Home Oxygen: No.  Oxygen Delivery: room  air  DISCHARGE LOCATION:   home   If you experience worsening of your admission symptoms, develop shortness of breath, life threatening emergency, suicidal or homicidal thoughts you must seek medical attention immediately by calling 911 or calling your MD immediately  if symptoms less severe.  You Must read complete instructions/literature along with all the possible adverse reactions/side effects for all the Medicines you take and that have been prescribed to you. Take any new Medicines  after you have completely understood and accpet all the possible adverse reactions/side effects.   Please note  You were cared for by a hospitalist during your hospital stay. If you have any questions about your discharge medications or the care you received while you were in the hospital after you are discharged, you can call the unit and asked to speak with the hospitalist on call if the hospitalist that took care of you is not available. Once you are discharged, your primary care physician will handle any further medical issues. Please note that NO REFILLS for any discharge medications will be authorized once you are discharged, as it is imperative that you return to your primary care physician (or establish a relationship with a primary care physician if you do not have one) for your aftercare needs so that they can reassess your need for medications and monitor your lab values.    On the day of Discharge:  VITAL SIGNS:   Blood pressure 109/66, pulse 74, temperature (!) 97.5 F (36.4 C), temperature source Oral, resp. rate 18, height 5' 1"  (1.549 m), weight 135.2 kg, last menstrual period 05/21/2018, SpO2 100 %.  PHYSICAL EXAMINATION:    GENERAL:  47 y.o.-year-old patient lying in the bed with no acute distress.  EYES: Pupils equal, round, reactive to light and accommodation. No scleral icterus. Extraocular muscles intact.  HEENT: Head atraumatic, normocephalic. Oropharynx and nasopharynx clear.  NECK:  Supple, no jugular venous distention. No thyroid enlargement, no tenderness.  LUNGS: Normal breath sounds bilaterally, no wheezing, rales,rhonchi or crepitation. No use of accessory muscles of respiration.  CARDIOVASCULAR: S1, S2 normal. No murmurs, rubs, or gallops.  ABDOMEN: Soft, nontender, nondistended. Bowel sounds present. No organomegaly or mass.  EXTREMITIES: No pedal edema, cyanosis, or clubbing.  NEUROLOGIC: Cranial nerves II through XII are intact. Muscle strength 5/5 in all  extremities.  Sensation intact . Gait not checked.  1+ lower extremity edema. PSYCHIATRIC: The patient is alert and oriented x 3.  SKIN: No obvious rash, lesion, or ulcer.   DATA REVIEW:   CBC Recent Labs  Lab 07/12/18 2057  WBC 13.0*  HGB 12.8  HCT 39.3  PLT 441*    Chemistries  Recent Labs  Lab 07/12/18 2057  NA 137  K 4.1  CL 107  CO2 21*  GLUCOSE 128*  BUN 17  CREATININE 0.85  CALCIUM 8.6*  AST 19  ALT 12  ALKPHOS 71  BILITOT 0.3     Microbiology Results  Results for orders placed or performed during the hospital encounter of 07/11/18  MRSA PCR Screening     Status: None   Collection Time: 07/11/18  5:20 PM  Result Value Ref Range Status   MRSA by PCR NEGATIVE NEGATIVE Final    Comment:        The GeneXpert MRSA Assay (FDA approved for NASAL specimens only), is one component of a comprehensive MRSA colonization surveillance program. It is not intended to diagnose MRSA infection nor to guide or monitor treatment for MRSA infections. Performed at  [address] [place], [place] [address]     RADIOLOGY:  Ct Head Wo Contrast  Result Date: 07/12/2018 CLINICAL DATA:  Follow-up stroke.  Status post tPA administration. EXAM: CT HEAD WITHOUT CONTRAST TECHNIQUE: Contiguous axial images were obtained from the base of the skull through the vertex without intravenous contrast. COMPARISON:  MRI of same day.  CT scan of July 11, 2018. FINDINGS: Brain: No evidence of acute infarction, hemorrhage, hydrocephalus, extra-axial collection or mass lesion/mass effect. Vascular: No hyperdense vessel or unexpected calcification. Skull: Normal. Negative for fracture or focal lesion. Sinuses/Orbits: No acute finding. Other: None. IMPRESSION: Normal head CT. Electronically Signed   By: Marijo Conception, M.D.   On: 07/12/2018 14:59   US Carotid Bilateral (at [facility] Only)  Result Date: 07/12/2018 CLINICAL DATA:  CVA EXAM: BILATERAL CAROTID DUPLEX  ULTRASOUND TECHNIQUE: Pearline Cables scale imaging, color Doppler and duplex ultrasound were performed of bilateral carotid and vertebral arteries in the neck. COMPARISON:  None. FINDINGS: Criteria: Quantification of carotid stenosis is based on velocity parameters that correlate the residual internal carotid diameter with NASCET-based stenosis levels, using the diameter of the distal internal carotid lumen as the denominator for stenosis measurement. The following velocity measurements were obtained: RIGHT ICA: 87 cm/sec CCA: 87 cm/sec SYSTOLIC ICA/CCA RATIO:  1.0 ECA: 45 cm/sec LEFT ICA: 110 cm/sec CCA: 82 cm/sec SYSTOLIC ICA/CCA RATIO:  1.2 ECA: 65 cm/sec RIGHT CAROTID ARTERY: Little if any plaque in the bulb. Low resistance internal carotid Doppler pattern. RIGHT VERTEBRAL ARTERY:  Antegrade. LEFT CAROTID ARTERY: Little if any plaque in the bulb. Low resistance internal carotid Doppler pattern is preserved. LEFT VERTEBRAL ARTERY:  Antegrade. IMPRESSION: Less than 50% stenosis in the right and left internal carotid arteries. Electronically Signed   By: Marybelle Killings M.D.   On: 07/12/2018 16:22     Management plans discussed with the patient, family and they are in agreement.  CODE STATUS:     Code Status Orders  (From admission, onward)         Start     Ordered   07/11/18 1546  Full code  Continuous     07/11/18 1550        Code Status History    This patient has a current code status but no historical code status.      TOTAL TIME TAKING CARE OF THIS PATIENT: 38 minutes.    Gladstone Lighter M.D on 07/13/2018 at 2:43 PM  Between 7am to 6pm - Pager - [phone]  After 6pm go to [email or web address] - [employer]wer [facility]  Office  [phone]  CC: Primary care physician; Steele Sizer, MD   Note: This dictation was prepared with Dragon dictation along with smaller phrase technology. Any transcriptional errors that result from this process are  unintentional.

## 2018-07-13 NOTE — Progress Notes (Addendum)
Subjective: No new strokes or stroke like symptoms reported. Patient still complains of slight weakness on the right with no obvious focal neurological deficit noted.  Objective: Current vital signs: BP 121/72 (BP Location: Left Arm)   Pulse 73   Temp 98.2 F (36.8 C) (Oral)   Resp 15   Ht 5\' 1"  (1.549 m)   Wt 135.2 kg   LMP 05/21/2018 (Exact Date)   SpO2 100%   BMI 56.32 kg/m  Vital signs in last 24 hours: Temp:  [98 F (36.7 C)-98.7 F (37.1 C)] 98.2 F (36.8 C) (12/04 0347) Pulse Rate:  [73-86] 73 (12/04 0347) Resp:  [15-21] 15 (12/04 0347) BP: (93-121)/(60-81) 121/72 (12/04 0347) SpO2:  [93 %-100 %] 100 % (12/04 0347)  Intake/Output from previous day: 12/03 0701 - 12/04 0700 In: 360 [P.O.:360] Out: -  Intake/Output this shift: No intake/output data recorded. Nutritional status:  Diet Order            Diet Heart Room service appropriate? Yes; Fluid consistency: Thin  Diet effective now             Neurological Exam  Mental Status: Alert, oriented, thought content appropriate. Speech fluent without evidence of aphasia. Able to follow 3 step commands without difficulty. Attention span and concentration seemed appropriate  Cranial Nerves: II: Discs flat bilaterally; Visual fields grossly normal, pupils equal, round, reactive to light and accommodation III,IV, VI: ptosis not present, extra-ocular motions intact bilaterally V,VII: smile symmetric, facial light touch sensationintact VIII: hearing normal bilaterally IX,X: gag reflex present XI: bilateral shoulder shrug XII: midline tongue extension Motor: Right :Upper extremity 3/5Without pronator driftLeft: Upper extremity 5/5 without pronator drift Right:Lower extremity 4/5Left: Lower extremity 5/5 Tone and bulk:normal tone throughout; no atrophy noted Sensory: Pinprick and light touch decreased on the right Deep Tendon Reflexes: 2+ and  symmetric throughout Plantars: Right: DowngoingLeft: Downgoing Cerebellar: Finger-to-nosetesting intact on the left. Gait: not tested due to safety concerns  Data Reviewed  Lab Results: Basic Metabolic Panel: Recent Labs  Lab 07/12/18 2057  NA 137  K 4.1  CL 107  CO2 21*  GLUCOSE 128*  BUN 17  CREATININE 0.85  CALCIUM 8.6*    Liver Function Tests: Recent Labs  Lab 07/12/18 2057  AST 19  ALT 12  ALKPHOS 68  BILITOT 0.3  PROT 6.5  ALBUMIN 3.3*   No results for input(s): LIPASE, AMYLASE in the last 168 hours. No results for input(s): AMMONIA in the last 168 hours.  CBC: Recent Labs  Lab 07/12/18 2057  WBC 13.0*  NEUTROABS 7.8*  HGB 12.8  HCT 39.3  MCV 88.9  PLT 441*    Cardiac Enzymes: Recent Labs  Lab 07/12/18 2057  TROPONINI <0.03    Lipid Panel: Recent Labs  Lab 07/12/18 2057  TRIG 82    CBG: Recent Labs  Lab 07/11/18 1305  GLUCAP 87    Microbiology: Results for orders placed or performed during the hospital encounter of 07/11/18  MRSA PCR Screening     Status: None   Collection Time: 07/11/18  5:20 PM  Result Value Ref Range Status   MRSA by PCR NEGATIVE NEGATIVE Final    Comment:        The GeneXpert MRSA Assay (FDA approved for NASAL specimens only), is one component of a comprehensive MRSA colonization surveillance program. It is not intended to diagnose MRSA infection nor to guide or monitor treatment for MRSA infections. Performed at [facility], [place], [place]  [address]     Coagulation Studies: Recent Labs    07/12/18 November 12, 2055  LABPROT 14.5  INR 1.14    Imaging: Ct Head Wo Contrast  Result Date: 07/12/2018 CLINICAL DATA:  Follow-up stroke.  Status post tPA administration. EXAM: CT HEAD WITHOUT CONTRAST TECHNIQUE: Contiguous axial images were obtained from the base of the skull through the vertex without intravenous contrast. COMPARISON:  MRI of same  day.  CT scan of July 11, 2018. FINDINGS: Brain: No evidence of acute infarction, hemorrhage, hydrocephalus, extra-axial collection or mass lesion/mass effect. Vascular: No hyperdense vessel or unexpected calcification. Skull: Normal. Negative for fracture or focal lesion. Sinuses/Orbits: No acute finding. Other: None. IMPRESSION: Normal head CT. Electronically Signed   By: Marijo Conception, M.D.   On: 07/12/2018 14:59   Mr Jeri Cos IH Contrast  Result Date: 07/12/2018 CLINICAL DATA:  RIGHT extremity pain and numbness since yesterday. Follow-up code stroke. EXAM: MRI HEAD WITHOUT AND WITH CONTRAST MRA HEAD WITHOUT CONTRAST TECHNIQUE: Multiplanar, multiecho pulse sequences of the brain and surrounding structures were obtained without and with intravenous contrast. Angiographic images of the head were obtained using MRA technique without contrast. CONTRAST:  10 cc Gadavist COMPARISON:  CT HEAD July 11, 2018 FINDINGS: MRI HEAD FINDINGS-moderate motion degraded axial T1 post gadolinium. INTRACRANIAL CONTENTS: No reduced diffusion to suggest acute ischemia or hyperacute demyelination. No susceptibility artifact to suggest hemorrhage. The ventricles and sulci are normal for patient's age. No suspicious parenchymal signal, masses, mass effect. No abnormal extra-axial fluid collections. No extra-axial masses. VASCULAR: Normal major intracranial vascular flow voids present at skull base. SKULL AND UPPER CERVICAL SPINE: No abnormal sellar expansion. No suspicious calvarial bone marrow signal. Susceptibility artifact related to convexity nonenhancing extra-axial calcifications. Craniocervical junction maintained. SINUSES/ORBITS: Mild paranasal sinus mucosal thickening, sphenoid sinus air-fluid level and bilateral maxillary sinus mucosal retention cysts. Mildly patulous optic nerve sheath associated with increased intracranial hypertension, no corroborated findings. OTHER: None. MRA HEAD FINDINGS ANTERIOR CIRCULATION:  Normal flow related enhancement of the included cervical, petrous, cavernous and supraclinoid internal carotid arteries. Patent anterior communicating artery. Patent anterior and middle cerebral arteries. No large vessel occlusion, flow limiting stenosis, aneurysm. POSTERIOR CIRCULATION: Codominant vertebral arteries. Vertebrobasilar arteries are patent, with normal flow related enhancement of the main branch vessels. Patent posterior cerebral arteries. Small bilateral posterior communicating arteries present. No large vessel occlusion, flow limiting stenosis,  aneurysm. ANATOMIC VARIANTS: None. Source images and MIP images were reviewed. IMPRESSION: 1. Normal MRI head with and without contrast. 2. Normal MRA head without contrast. Electronically Signed   By: Elon Alas M.D.   On: 07/12/2018 05:27   US Carotid Bilateral (at [facility] Only)  Result Date: 07/12/2018 CLINICAL DATA:  CVA EXAM: BILATERAL CAROTID DUPLEX ULTRASOUND TECHNIQUE: Pearline Cables scale imaging, color Doppler and duplex ultrasound were performed of bilateral carotid and vertebral arteries in the neck. COMPARISON:  None. FINDINGS: Criteria: Quantification of carotid stenosis is based on velocity parameters that correlate the residual internal carotid diameter with NASCET-based stenosis levels, using the diameter of the distal internal carotid lumen as the denominator for stenosis measurement. The following velocity measurements were obtained: RIGHT ICA: 87 cm/sec CCA: 87 cm/sec SYSTOLIC ICA/CCA RATIO:  1.0 ECA: 45 cm/sec LEFT ICA: 110 cm/sec CCA: 82 cm/sec SYSTOLIC ICA/CCA RATIO:  1.2 ECA: 65 cm/sec RIGHT CAROTID ARTERY: Little if any plaque in the bulb. Low resistance internal carotid Doppler pattern. RIGHT VERTEBRAL ARTERY:  Antegrade. LEFT CAROTID ARTERY: Little if any plaque in the bulb. Low resistance  internal carotid Doppler pattern is preserved. LEFT VERTEBRAL ARTERY:  Antegrade. IMPRESSION: Less than 50% stenosis in the right and left  internal carotid arteries. Electronically Signed   By: Marybelle Killings M.D.   On: 07/12/2018 16:22   Mr Jodene Nam Head Wo Contrast  Result Date: 07/12/2018 CLINICAL DATA:  RIGHT extremity pain and numbness since yesterday. Follow-up code stroke. EXAM: MRI HEAD WITHOUT AND WITH CONTRAST MRA HEAD WITHOUT CONTRAST TECHNIQUE: Multiplanar, multiecho pulse sequences of the brain and surrounding structures were obtained without and with intravenous contrast. Angiographic images of the head were obtained using MRA technique without contrast. CONTRAST:  10 cc Gadavist COMPARISON:  CT HEAD July 11, 2018 FINDINGS: MRI HEAD FINDINGS-moderate motion degraded axial T1 post gadolinium. INTRACRANIAL CONTENTS: No reduced diffusion to suggest acute ischemia or hyperacute demyelination. No susceptibility artifact to suggest hemorrhage. The ventricles and sulci are normal for patient's age. No suspicious parenchymal signal, masses, mass effect. No abnormal extra-axial fluid collections. No extra-axial masses. VASCULAR: Normal major intracranial vascular flow voids present at skull base. SKULL AND UPPER CERVICAL SPINE: No abnormal sellar expansion. No suspicious calvarial bone marrow signal. Susceptibility artifact related to convexity nonenhancing extra-axial calcifications. Craniocervical junction maintained. SINUSES/ORBITS: Mild paranasal sinus mucosal thickening, sphenoid sinus air-fluid level and bilateral maxillary sinus mucosal retention cysts. Mildly patulous optic nerve sheath associated with increased intracranial hypertension, no corroborated findings. OTHER: None. MRA HEAD FINDINGS ANTERIOR CIRCULATION: Normal flow related enhancement of the included cervical, petrous, cavernous and supraclinoid internal carotid arteries. Patent anterior communicating artery. Patent anterior and middle cerebral arteries. No large vessel occlusion, flow limiting stenosis, aneurysm. POSTERIOR CIRCULATION: Codominant vertebral arteries.  Vertebrobasilar arteries are patent, with normal flow related enhancement of the main branch vessels. Patent posterior cerebral arteries. Small bilateral posterior communicating arteries present. No large vessel occlusion, flow limiting stenosis,  aneurysm. ANATOMIC VARIANTS: None. Source images and MIP images were reviewed. IMPRESSION: 1. Normal MRI head with and without contrast. 2. Normal MRA head without contrast. Electronically Signed   By: Elon Alas M.D.   On: 07/12/2018 05:27   Ct Head Code Stroke Wo Contrast  Addendum Date: 07/11/2018   ADDENDUM REPORT: 07/11/2018 13:41 ADDENDUM: Study discussed by telephone with Dr. Lavonia Drafts on 07/11/2018 at 1336 hours. Electronically Signed   By: Genevie Ann M.D.   On: 07/11/2018 13:41   Result Date: 07/11/2018 CLINICAL DATA:  Code stroke. 47 year old female with episodes of right extremity pain and numbness beginning yesterday. EXAM: CT HEAD WITHOUT CONTRAST TECHNIQUE: Contiguous axial images were obtained from the base of the skull through the vertex without intravenous contrast. COMPARISON:  None. FINDINGS: Brain: No midline shift, ventriculomegaly, mass effect, evidence of mass lesion, intracranial hemorrhage or evidence of cortically based acute infarction. Gray-white matter differentiation is within normal limits throughout the brain. Normal cerebral volume. Vascular: No suspicious intracranial vascular hyperdensity. Skull: Negative. Sinuses/Orbits: Mild mucosal thickening in the visible paranasal sinuses. The mastoids and tympanic cavities appear well pneumatized. Other: Visualized orbits and scalp soft tissues are within normal limits. ASPECTS [facility] Stroke Program Early CT Score) - Ganglionic level infarction (caudate, lentiform nuclei, internal capsule, insula, M1-M3 cortex): 7 - Supraganglionic infarction (M4-M6 cortex): 3 Total score (0-10 with 10 being normal): 10 IMPRESSION: 1. Normal noncontrast CT appearance of the brain. 2. ASPECTS is 10.  Electronically Signed: By: Genevie Ann M.D. On: 07/11/2018 13:21    Medications:  I have reviewed the patient's current medications. Prior to Admission:  Medications Prior to Admission  Medication Sig Dispense Refill  Last Dose  . Albuterol Sulfate (VENTOLIN HFA IN) Inhale into the lungs as needed (for shortness of breath).    prn at prn  . Cinnamon 500 MG TABS Take 2 tablets by mouth daily.   07/10/2018 at 0800  . mometasone-formoterol (DULERA) 200-5 MCG/ACT AERO Inhale 2 puffs into the lungs 2 (two) times daily.   07/11/2018 at 0800  . montelukast (SINGULAIR) 10 MG tablet Take 1 tablet by mouth daily.   07/11/2018 at 0800  . Multiple Vitamins-Calcium (ONE-A-DAY WITHIN PO) Take 1 tablet by mouth daily.   07/10/2018 at 0800  . Omega 3 1000 MG CAPS Take 1 capsule by mouth daily.   07/10/2018 at 0800  . vitamin B-12 (CYANOCOBALAMIN) 1000 MCG tablet Take 1,000 mcg by mouth daily.   07/10/2018 at 0800  . Vitamin D, Ergocalciferol, (DRISDOL) 1.25 MG (50000 UT) CAPS capsule Take 1 capsule (50,000 Units total) by mouth every 7 (seven) days. For 3 months. 4 capsule 2 Past Week at Unknown time   Scheduled: .  stroke: mapping our early stages of recovery book   Does not apply Once  . atorvastatin  40 mg Oral q1800  . heparin injection (subcutaneous)  5,000 Units Subcutaneous Q8H  . labetalol  20 mg Intravenous Once  . mometasone-formoterol  2 puff Inhalation BID  . montelukast  10 mg Oral Daily  . pantoprazole  40 mg Oral QHS  . Vitamin D (Ergocalciferol)  50,000 Units Oral Q7 days    Assessment/Plan:47 y.o. female with past medical history of asthma presenting to the ED with chief complaints of sudden onset right sided weakness and numbness s/p IV tPA. Concerns for TIA.  Stroke-like episode s/p IV tPA on 07/11/2018 Concerns for TIA?  CT head - No acute hemorrhage.   MRI/MRA head - No acute intracranial abnormality.  US carotids bilateral - No hemodynamically significant stenosis  2D Echo - EF  65-70%. No cardiac source of embolus  CT head in 24 hours s/p IV Alteplase - No acute intracranial abnormality or hemorrhage  LDL- 161 05/30/2018  HgbA1c- 5.0 05/30/2018 No antithromboticprior to admission, now on ASA 81 mg daily.   Recommendations: 1. Continue Aspirin 81 mg/day with intensive management of vascular risk factor to keep systolic BP (SBP) <628 mm Hg (130 mm Hg if diabetic) 2. Statin for goal  low density lipoprotein (LDL) <70 mg/dl 3. Recommend follow up with outpatient neurology  This patient was staffed with Dr. Magda Paganini, Doy Mince who personally evaluated patient, reviewed documentation and agreed with assessment and plan of care as above.  Rufina Falco, DNP, FNP-BC Board certified Nurse Practitioner Neurology Department     LOS: 2 days   07/13/2018  9:46 AM

## 2018-07-13 NOTE — Progress Notes (Signed)
     [name] was admitted to the [facility] on 07/11/2018 for an acute medical condition and is being Discharged on  07/13/2018 .  She is still recovering and  will need another 3  days for recovery and so advised to stay away from work until then. So please excuse  her from work for the above  Days. Should be able to return to work without any restrictions from 07/18/18.  Call Gladstone Lighter  MD, [facility] at  [phone] with questions.  Gladstone Lighter M.D on 07/13/2018,at 1:02 PM  [facility] [address] [place] [address]

## 2018-07-13 NOTE — Progress Notes (Signed)
PT Cancellation Note  Patient Details Name: Marwah Disbro MRN: [id number] DOB: 21-Apr-1971   Cancelled Treatment:    Reason Eval/Treat Not Completed: Patient at procedure or test/unavailable(Patient off unit for diagnostic imaging (L UE doppler).  Will re-attempt at later time/date.)  Rickayla Wieland H. Owens Shark, PT, DPT, NCS 07/13/18, 3:16 PM [phone]

## 2018-07-14 ENCOUNTER — Telehealth: Payer: Self-pay

## 2018-07-14 ENCOUNTER — Inpatient Hospital Stay: Payer: BLUE CROSS/BLUE SHIELD | Admitting: Oncology

## 2018-07-14 LAB — HIV ANTIBODY (ROUTINE TESTING W REFLEX): HIV Screen 4th Generation wRfx: NONREACTIVE

## 2018-07-14 NOTE — Telephone Encounter (Signed)
Left msg for pt to call me directly for TCM call. CB# [phone]

## 2018-07-15 NOTE — Telephone Encounter (Signed)
Transition Care Management Follow-up Telephone Call  Date of discharge and from where: [facility] 07/13/18   How have you been since you were released from the hospital? Doing okay but still in pain on right side and back with weakness and soreness from CVA   Any questions or concerns? No  Pt states she was to be discharged with home health physical therapy and no one has contacted her yet for PT.   I reviewed chart to look for information for home health company that patient was referred to and no information available. Pt contact discharge department at hospital and was told to call back on Monday if home health did not contact her over the weekend. Pt would like to complete home health PT for gait and strengthening exercises. This may need to be ordered at her hospital f/u appt.   Items Reviewed:  Did the pt receive and understand the discharge instructions provided? Yes   Medications obtained and verified? Yes   Any new allergies since your discharge? No   Dietary orders reviewed? Yes  Do you have support at home? Yes   Functional Questionnaire: (I = Independent and D = Dependent) ADLs: D  Bathing/Dressing- D  Meal Prep- D  Eating- I  Maintaining continence- I  Transferring/Ambulation- I  Managing Meds- I  Follow up appointments reviewed:   PCP Hospital f/u appt confirmed? Yes  Scheduled to see Dr. Ancil Boozer on 07/18/18 @ 2:20.  [facility] f/u appt confirmed? No  Pt to schedule with Dr. Manuella Ghazi for neurology  Are transportation arrangements needed? No   If their condition worsens, is the pt aware to call PCP or go to the Emergency Dept.? Yes  Was the patient provided with contact information for the PCP's office or ED? Yes  Was to pt encouraged to call back with questions or concerns? Yes

## 2018-07-18 ENCOUNTER — Ambulatory Visit (INDEPENDENT_AMBULATORY_CARE_PROVIDER_SITE_OTHER): Payer: BLUE CROSS/BLUE SHIELD | Admitting: Family Medicine

## 2018-07-18 ENCOUNTER — Encounter: Payer: Self-pay | Admitting: Family Medicine

## 2018-07-18 VITALS — BP 136/88 | HR 76 | Temp 97.6°F | Resp 18 | Ht 59.0 in | Wt 297.3 lb

## 2018-07-18 DIAGNOSIS — I69351 Hemiplegia and hemiparesis following cerebral infarction affecting right dominant side: Secondary | ICD-10-CM | POA: Diagnosis not present

## 2018-07-18 NOTE — Progress Notes (Signed)
Name: [name]   MRN: [id number]    DOB: Nov 04, 1970   Date:07/18/2018       Progress Note  Subjective  Chief Complaint  Chief Complaint  Patient presents with  . Hospitalization Follow-up    HPI  Hospital Discharge follow up: she states she was at work on 07/12/2018 and right arm and leg went limp, she felt weak and her supervisor helped her to sit down. She felt hot and 911 was called. She was transplanted to [facility] and received PTA. MRI, MRA and CT all negative, echo normal. She was sent home on Atorvastatin, aspirin since discharge , she was supposed to have home PT but has not been contacted yet, she is also supposed to see neurologist. Patient is still having weakness on right side of her body, tingling and has difficulty ambulating. She also states has pain. She is requiring assistance with bathing, cannot clean her house and mother or husband are driving her around. She would benefit from having home PT She has been compliant with atorvastatin and aspirin   Patient Active Problem List   Diagnosis Date Noted  . CVA (cerebral vascular accident) ([place]) 07/11/2018  . Thrombocytosis ([place]) 05/30/2018  . Lymphedema of both lower extremities 05/30/2018  . Moderate episode of recurrent major depressive disorder ([place]) 05/30/2018  . Pure hypercholesterolemia 05/30/2018  . Menorrhagia with regular cycle 10/12/2017  . Morbid obesity ([place]) 01/27/2017  . History of vitamin D deficiency 01/27/2017  . History of hypercholesterolemia 01/27/2017  . Fibroids 01/27/2017  . Asthma, well controlled 01/15/2014    Past Surgical History:  Procedure Laterality Date  . CESAREAN SECTION  1989  . COLONOSCOPY    . FOOT SURGERY Left 2013  . HERNIA REPAIR      Family History  Problem Relation Age of Onset  . Diabetes Mother   . Uterine cancer Mother   . Colon cancer Mother   . Cancer - Lung Father   . Colon cancer Father   . Diverticulitis Brother   . Hernia Brother        x2 surgeries   . Colon cancer Paternal Grandmother   . Lung cancer Paternal Grandmother     Social History   Socioeconomic History  . Marital status: Married    Spouse name: Stage manager  . Number of children: 1  . Years of education: Not on file  . Highest education level: Some college, no degree  Occupational History  . Occupation: customer services   Social Needs  . Financial resource strain: Not hard at all  . Food insecurity:    Worry: Never true    Inability: Never true  . Transportation needs:    Medical: No    Non-medical: No  Tobacco Use  . Smoking status: Never Smoker  . Smokeless tobacco: Never Used  Substance and Sexual Activity  . Alcohol use: Yes    Comment: occasionally  . Drug use: No  . Sexual activity: Yes    Partners: Male    Birth control/protection: Pill  Lifestyle  . Physical activity:    Days per week: 5 days    Minutes per session: 30 min  . Stress: Only a little  Relationships  . Social connections:    Talks on phone: More than three times a week    Gets together: Twice a week    Attends religious service: More than 4 times per year    Active member of club or organization: No  Attends meetings of clubs or organizations: Never    Relationship status: Married  . Intimate partner violence:    Fear of current or ex partner: No    Emotionally abused: No    Physically abused: No    Forced sexual activity: No  Other Topics Concern  . Not on file  Social History Narrative   Married with her high school sweet heart , they had first child at age 89, they recently got married ( on their daughter's birthday 2019), she two miscarriages     Current Outpatient Medications:  .  Albuterol Sulfate (VENTOLIN HFA IN), Inhale into the lungs as needed (for shortness of breath). , Disp: , Rfl:  .  aspirin 81 MG chewable tablet, Chew 1 tablet (81 mg total) by mouth daily., Disp: 30 tablet, Rfl: 0 .  atorvastatin (LIPITOR) 40 MG tablet, Take 1 tablet (40 mg total)  by mouth daily at 6 PM., Disp: 30 tablet, Rfl: 1 .  Cinnamon 500 MG TABS, Take 2 tablets by mouth daily., Disp: , Rfl:  .  mometasone-formoterol (DULERA) 200-5 MCG/ACT AERO, Inhale 2 puffs into the lungs 2 (two) times daily., Disp: , Rfl:  .  montelukast (SINGULAIR) 10 MG tablet, Take 1 tablet by mouth daily., Disp: , Rfl:  .  Multiple Vitamins-Calcium (ONE-A-DAY WITHIN PO), Take 1 tablet by mouth daily., Disp: , Rfl:  .  Omega 3 1000 MG CAPS, Take 1 capsule by mouth daily., Disp: , Rfl:  .  vitamin B-12 (CYANOCOBALAMIN) 1000 MCG tablet, Take 1,000 mcg by mouth daily., Disp: , Rfl:  .  Vitamin D, Ergocalciferol, (DRISDOL) 1.25 MG (50000 UT) CAPS capsule, Take 1 capsule (50,000 Units total) by mouth every 7 (seven) days. For 3 months., Disp: 4 capsule, Rfl: 2  Allergies  Allergen Reactions  . Advil [Ibuprofen] Other (See Comments)    Other Reaction: Other reaction-swells  . Percocet [Oxycodone-Acetaminophen] Nausea And Vomiting and Other (See Comments)    headaches    I personally reviewed active problem list, medication list, allergies, family history, social history with the patient/caregiver today.   ROS  Constitutional: Negative for fever or weight change.  Respiratory: Negative for cough and shortness of breath.   Cardiovascular: Negative for chest pain or palpitations.  Gastrointestinal: Negative for abdominal pain, no bowel changes.  Musculoskeletal: positive  for gait problem , right side weakness but no joint swelling.  Skin: Negative for rash.  Neurological: Negative for dizziness or headache.  No other specific complaints in a complete review of systems (except as listed in HPI above).  Objective  Vitals:   07/18/18 1508  BP: 136/88  Pulse: 76  Resp: 18  Temp: 97.6 F (36.4 C)  TempSrc: Oral  SpO2: 96%  Weight: 297 lb 4.8 oz (134.9 kg)  Height: 4' 11"  (1.499 m)    Body mass index is 60.05 kg/m.  Physical Exam  Constitutional: Patient appears well-developed  and well-nourished. Obese  No distress.  HEENT: head atraumatic, normocephalic, pupils equal and reactive to light,  neck supple, throat within normal limits Cardiovascular: Normal rate, regular rhythm and normal heart sounds.  No murmur heard. No BLE edema. Pulmonary/Chest: Effort normal and breath sounds normal. No respiratory distress. Abdominal: Soft.  There is no tenderness. Psychiatric: Patient has a normal mood and affect. behavior is normal. Judgment and thought content normal. Neurological: she has pain on right side, antalgic gait, in pain with palpation, grip seems symmetrical during exam.    Recent Results (from the past  2160 hour(s))  GC/Chlamydia probe amp ([place])not at [facility]     Status: None   Collection Time: 05/04/18 12:00 AM  Result Value Ref Range   Chlamydia Negative     Comment: Normal Reference Range - Negative   Neisseria gonorrhea Negative     Comment: Normal Reference Range - Negative  HEP, RPR, HIV Panel     Status: None   Collection Time: 05/04/18  2:43 PM  Result Value Ref Range   Hepatitis B Surface Ag Negative Negative   RPR Ser Ql Non Reactive Non Reactive   HIV Screen 4th Generation wRfx Non Reactive Non Reactive  CBC with Differential/Platelet     Status: Abnormal   Collection Time: 05/30/18  4:45 PM  Result Value Ref Range   WBC 15.1 (H) 3.8 - 10.8 Thousand/uL   RBC 4.63 3.80 - 5.10 Million/uL   Hemoglobin 13.5 11.7 - 15.5 g/dL   HCT 40.2 35.0 - 45.0 %   MCV 86.8 80.0 - 100.0 fL   MCH 29.2 27.0 - 33.0 pg   MCHC 33.6 32.0 - 36.0 g/dL   RDW 13.2 11.0 - 15.0 %   Platelets 332 140 - 400 Thousand/uL   MPV 10.0 7.5 - 12.5 fL   Neutro Abs 9,558 (H) 1,500 - 7,800 cells/uL   Lymphs Abs 4,122 (H) 850 - 3,900 cells/uL   WBC mixed population 664 200 - 950 cells/uL   Eosinophils Absolute 634 (H) 15 - 500 cells/uL   Basophils Absolute 121 0 - 200 cells/uL   Neutrophils Relative % 63.3 %   Total Lymphocyte 27.3 %   Monocytes Relative 4.4 %    Eosinophils Relative 4.2 %   Basophils Relative 0.8 %  COMPLETE METABOLIC PANEL WITH GFR     Status: None   Collection Time: 05/30/18  4:45 PM  Result Value Ref Range   Glucose, Bld 69 65 - 139 mg/dL    Comment: .        Non-fasting reference interval .    BUN 19 7 - 25 mg/dL   Creat 0.90 0.50 - 1.10 mg/dL   GFR, Est Non African American 76 > OR = 60 mL/min/1.42m   GFR, Est African American 88 > OR = 60 mL/min/1.719m  BUN/Creatinine Ratio NOT APPLICABLE 6 - 22 (calc)   Sodium 139 135 - 146 mmol/L   Potassium 4.3 3.5 - 5.3 mmol/L   Chloride 101 98 - 110 mmol/L   CO2 28 20 - 32 mmol/L   Calcium 9.2 8.6 - 10.2 mg/dL   Total Protein 7.2 6.1 - 8.1 g/dL   Albumin 4.1 3.6 - 5.1 g/dL   Globulin 3.1 1.9 - 3.7 g/dL (calc)   AG Ratio 1.3 1.0 - 2.5 (calc)   Total Bilirubin 0.3 0.2 - 1.2 mg/dL   Alkaline phosphatase (APISO) 72 33 - 115 U/L   AST 19 10 - 35 U/L   ALT 14 6 - 29 U/L  TSH     Status: Abnormal   Collection Time: 05/30/18  4:45 PM  Result Value Ref Range   TSH 4.61 (H) mIU/L    Comment:           Reference Range .           > or = 20 Years  0.40-4.50 .                Pregnancy Ranges           First trimester  0.26-2.66           Second trimester   0.55-2.73           Third trimester    0.43-2.91   VITAMIN D 25 Hydroxy (Vit-D Deficiency, Fractures)     Status: Abnormal   Collection Time: 05/30/18  4:45 PM  Result Value Ref Range   Vit D, 25-Hydroxy 17 (L) 30 - 100 ng/mL    Comment: Vitamin D Status         25-OH Vitamin D: . Deficiency:                    <20 ng/mL Insufficiency:             20 - 29 ng/mL Optimal:                 > or = 30 ng/mL . For 25-OH Vitamin D testing on patients on  D2-supplementation and patients for whom quantitation  of D2 and D3 fractions is required, the QuestAssureD(TM) 25-OH VIT D, (D2,D3), LC/MS/MS is recommended: order  code [phone] (patients >15yr). . For more information on this test, go  to: http://education.questdiagnostics.com/faq/FAQ163 (This link is being provided for  informational/educational purposes only.)   Hemoglobin A1c     Status: None   Collection Time: 05/30/18  4:45 PM  Result Value Ref Range   Hgb A1c MFr Bld 5.3 <5.7 % of total Hgb    Comment: For the purpose of screening for the presence of diabetes: . <5.7%       Consistent with the absence of diabetes 5.7-6.4%    Consistent with increased risk for diabetes             (prediabetes) > or =6.5%  Consistent with diabetes . This assay result is consistent with a decreased risk of diabetes. . Currently, no consensus exists regarding use of hemoglobin A1c for diagnosis of diabetes in children. . According to American Diabetes Association (ADA) guidelines, hemoglobin A1c <7.0% represents optimal control in non-pregnant diabetic patients. Different metrics may apply to specific patient populations.  Standards of Medical Care in Diabetes(ADA). .    Mean Plasma Glucose 105 (calc)   eAG (mmol/L) 5.8 (calc)  Lipid panel     Status: Abnormal   Collection Time: 05/30/18  4:45 PM  Result Value Ref Range   Cholesterol 272 (H) <200 mg/dL   HDL 90 >50 mg/dL   Triglycerides 97 <150 mg/dL   LDL Cholesterol (Calc) 161 (H) mg/dL (calc)    Comment: Reference range: <100 . Desirable range <100 mg/dL for primary prevention;   <70 mg/dL for patients with CHD or diabetic patients  with > or = 2 CHD risk factors. .Marland KitchenLDL-C is now calculated using the Martin-Hopkins  calculation, which is a validated novel method providing  better accuracy than the Friedewald equation in the  estimation of LDL-C.  MCresenciano Genreet al. JAnnamaria Helling 21324;401(02: 2061-2068  (http://education.Quest[facility].com/faq/FAQ164)    Total CHOL/HDL Ratio 3.0 <5.0 (calc)   Non-HDL Cholesterol (Calc) 182 (H) <130 mg/dL (calc)    Comment: For patients with diabetes plus 1 major ASCVD risk  factor, treating to a non-HDL-C goal of <100 mg/dL   (LDL-C of <70 mg/dL) is considered a therapeutic  option.   Flow cytometry panel-leukemia/lymphoma work-up     Status: None   Collection Time: 07/01/18 10:49 AM  Result Value Ref Range   PATH INTERP XXX-IMP Comment     Comment: (NOTE) Eosinophilia. No other significant diagnostic  immunophenotypic abnormality detected. (See comment.)    ANNOTATION COMMENT IMP Comment     Comment: (NOTE) Eosinophilia can be associated with drug reaction, parasitic infection, and neoplastic processes, among others. Clinical correlation is necessary.    CLINICAL INFO Comment     Comment: (NOTE) Accompanying CBC dated 07/01/18 shows:  WBC count 10.7, Neu 6.0, Lym 3.0, Mon 0.5, Eos 1.1, Eos% 10.    Specimen Type Comment     Comment: Peripheral blood   ASSESSMENT OF LEUKOCYTES Comment     Comment: (NOTE) No monoclonal B cell population is detected. kappa:lambda ratio 2.3 There is no loss of, or aberrant expression of, the pan T cell antigens to suggest a neoplastic T cell process. CD4:CD8 ratio 5.0 No circulating blasts are detected. There is no immunophenotypic evidence of abnormal myeloid maturation. Eosinophils are increased and account for approximately 10% of leukocytes. Analysis of the leukocyte population shows: Neutophils 62%, eosinophils 10%, monocytes 3%, lymphocytes 25%, blasts <0.5%, B cells 3%, T cells 20%, NK cells 2%.    % Viable Cells Comment     Comment: 90%   ANALYSIS AND GATING STRATEGY Comment     Comment: 8 color analysis with CD45/SSC gating   IMMUNOPHENOTYPING STUDY Comment     Comment: (NOTE) CD2       Normal         CD3       Normal CD4       Normal         CD5       Normal CD7       Normal         CD8       Normal CD10      Normal         CD11b     Normal CD13      Normal         CD14      Normal CD16      Normal         CD19      Normal CD20      Normal         CD33      Normal CD34      Normal         CD38      Normal CD45      Normal         CD56       Normal CD57      Normal         CD117     Normal HLA-DR    Normal         KAPPA     Normal LAMBDA    Normal         CD64      Normal    PATHOLOGIST NAME Comment     Comment: Cecilie Kicks, M. D.   COMMENT: Comment     Comment: (NOTE) Each antibody in this assay was utilized to assess for potential abnormalities of studied cell populations or to characterize identified abnormalities. This test was developed and its performance characteristics determined by [facility].  It has not been cleared or approved by the U.S. Food and Drug Administration. The FDA has determined that such clearance or approval is not necessary. This test is used for clinical purposes.  It should not be regarded as investigational or for research. Performed At: -[facility] [address], [place] [id number] Nechama Guard  MD [id number] Performed At: [facility] [address] [place], [place] [id number] Nechama Guard MD OX:[id number]   BCR-ABL1 FISH     Status: None   Collection Time: 07/01/18 10:49 AM  Result Value Ref Range   Specimen Type BLOOD    Cells Counted 200    Cells Analyzed 200    FISH Result Comment:     Comment: NORMAL:  NO BCR OR ABL1 GENE REARRANGEMENT OBSERVED   Interpretation Comment:     Comment: (NOTE)             nuc ish 9q34(ASS1,ABL1)x2,22q11.2(BCRx2)[200].      The fluorescence in situ hybridization (FISH) study was normal. FISH, using unique sequence DNA probes for the ABL1 and BCR gene regions showed two ABL1 signals (red), two control ASS1 gene signals (aqua) located adjacent to the ABL1 locus at 9q34, and two BCR signals (green) at 22q11.2 in all interphase nuclei examined. There was NO evidence of CML or ALL-associated BCR/ABL1 dual fusion signals in this analysis. .      This analysis is limited to abnormalities detectable by the specific probes included in the study. FISH results should be interpreted within the context of a  full cytogenetic analysis and pathology evaluation. .      This test was developed and its performance characteristics determined by [employer]). It has not been cleared or approved by the U.S. Food and Drug Administration. A BCR-ABL1 gene fusion in greater than 3 interphase nuclei in a  patient with a new clinical diagnosis is considered positive. The DNA probe vendor for this study was [employer], research (physical sciences)).    Director Review: Comment:     Comment: (NOTE) Kirby Crigler, PHD, [place] Performed At: [facility] [address] [place], [place] [id number] Nechama Guard MD WL:[id number] Performed At: [facility] [address] [place], [place] [id number] Rush Farmer MD EH:[id number]   CBC with Differential/Platelet     Status: Abnormal   Collection Time: 07/01/18 10:49 AM  Result Value Ref Range   WBC 10.7 (H) 4.0 - 10.5 K/uL   RBC 4.35 3.87 - 5.11 MIL/uL   Hemoglobin 12.4 12.0 - 15.0 g/dL   HCT 38.1 36.0 - 46.0 %   MCV 87.6 80.0 - 100.0 fL   MCH 28.5 26.0 - 34.0 pg   MCHC 32.5 30.0 - 36.0 g/dL   RDW 13.7 11.5 - 15.5 %   Platelets 340 150 - 400 K/uL   nRBC 0.0 0.0 - 0.2 %    Comment: Performed at [facility], [place], [place] [address]   Neutrophils Relative % ORDER MODIFIED OR RESCHEDULED %   Neutro Abs ORDER MODIFIED OR RESCHEDULED 1.7 - 7.7 K/uL   Band Neutrophils ORDER MODIFIED OR RESCHEDULED %   Lymphocytes Relative ORDER MODIFIED OR RESCHEDULED %   Lymphs Abs ORDER MODIFIED OR RESCHEDULED 0.7 - 4.0 K/uL   Monocytes Relative ORDER MODIFIED OR RESCHEDULED %   Monocytes Absolute ORDER MODIFIED OR RESCHEDULED 0.1 - 1.0 K/uL   Eosinophils Relative ORDER MODIFIED OR RESCHEDULED %   Eosinophils Absolute ORDER MODIFIED OR RESCHEDULED 0.0 - 0.5 K/uL   Basophils Relative ORDER MODIFIED OR RESCHEDULED %   Basophils Absolute ORDER MODIFIED OR RESCHEDULED 0.0 - 0.1 K/uL   WBC  Morphology ORDER MODIFIED OR RESCHEDULED    RBC Morphology ORDER MODIFIED OR RESCHEDULED    Smear Review ORDER MODIFIED OR RESCHEDULED    Other ORDER MODIFIED OR  RESCHEDULED %   nRBC ORDER MODIFIED OR RESCHEDULED 0 /100 WBC   Metamyelocytes Relative ORDER MODIFIED OR RESCHEDULED %   Myelocytes ORDER MODIFIED OR RESCHEDULED %   Promyelocytes Relative ORDER MODIFIED OR RESCHEDULED %   Blasts ORDER MODIFIED OR RESCHEDULED %  Technologist smear review     Status: None   Collection Time: 07/01/18 10:49 AM  Result Value Ref Range   Tech Review RBC MORPHOLOGY NORMAL     Comment: Normal platelet morphology PLATELETS APPEAR ADEQUATE Reviewed by Janae Bridgeman L. [place], M.D. [facility] MORPHOLOGY UNREMARKABLE Performed at [facility], [address] [place], [place] [address]   Differential     Status: Abnormal   Collection Time: 07/01/18 11:36 AM  Result Value Ref Range   Neutrophils Relative % 56 %   Neutro Abs 6.0 1.7 - 7.7 K/uL   Lymphocytes Relative 28 %   Lymphs Abs 3.0 0.7 - 4.0 K/uL   Monocytes Relative 5 %   Monocytes Absolute 0.5 0.1 - 1.0 K/uL   Eosinophils Relative 10 %   Eosinophils Absolute 1.1 (H) 0.0 - 0.5 K/uL   Basophils Relative 1 %   Basophils Absolute 0.1 0.0 - 0.1 K/uL   Immature Granulocytes 0 %   Abs Immature Granulocytes 0.04 0.00 - 0.07 K/uL    Comment: Performed at [facility], [address] [place], [place] [address]  Glucose, capillary     Status: None   Collection Time: 07/11/18  1:05 PM  Result Value Ref Range   Glucose-Capillary 87 70 - 99 mg/dL  MRSA PCR Screening     Status: None   Collection Time: 07/11/18  5:20 PM  Result Value Ref Range   MRSA by PCR NEGATIVE NEGATIVE    Comment:        The GeneXpert MRSA Assay (FDA approved for NASAL specimens only), is one component of a comprehensive MRSA colonization surveillance program. It is not intended to diagnose MRSA infection nor to guide or monitor treatment for MRSA  infections. Performed at [facility], [address] [place], [place] [address]   ECHOCARDIOGRAM COMPLETE     Status: None   Collection Time: 07/12/18 12:30 PM  Result Value Ref Range   Weight 4,800 oz   Height 59 in   BP 100/63 mmHg  Ethanol     Status: None   Collection Time: 07/12/18  8:57 PM  Result Value Ref Range   Alcohol, Ethyl (B) <10 <10 mg/dL    Comment: (NOTE) Lowest detectable limit for serum alcohol is 10 mg/dL. For medical purposes only. Performed at [facility], [address] [place], [place] [address]   Protime-INR     Status: None   Collection Time: 07/12/18  8:57 PM  Result Value Ref Range   Prothrombin Time 14.5 11.4 - 15.2 seconds   INR 1.14     Comment: Performed at [facility], [address] [place], [place] [address]  APTT     Status: None   Collection Time: 07/12/18  8:57 PM  Result Value Ref Range   aPTT 31 24 - 36 seconds    Comment: Performed at [facility], [address] [place],  [address]  CBC     Status: Abnormal   Collection Time: 07/12/18  8:57 PM  Result Value Ref Range   WBC 13.0 (H) 4.0 - 10.5 K/uL   RBC 4.42 3.87 - 5.11 MIL/uL   Hemoglobin 12.8 12.0 - 15.0 g/dL   HCT  39.3 36.0 - 46.0 %   MCV 88.9 80.0 - 100.0 fL   MCH 29.0 26.0 - 34.0 pg   MCHC 32.6 30.0 - 36.0 g/dL   RDW 14.1 11.5 - 15.5 %   Platelets 441 (H) 150 - 400 K/uL   nRBC 0.0 0.0 - 0.2 %    Comment: Performed at [facility], [address] [place], [place] [address]  Differential     Status: Abnormal   Collection Time: 07/12/18  8:57 PM  Result Value Ref Range   Neutrophils Relative % 59 %   Neutro Abs 7.8 (H) 1.7 - 7.7 K/uL   Lymphocytes Relative 27 %   Lymphs Abs 3.5 0.7 - 4.0 K/uL   Monocytes Relative 5 %   Monocytes Absolute 0.7 0.1 - 1.0 K/uL   Eosinophils Relative 7 %   Eosinophils Absolute 0.9 (H) 0.0 - 0.5 K/uL   Basophils Relative 1 %   Basophils Absolute 0.1 0.0 - 0.1 K/uL   Immature  Granulocytes 1 %   Abs Immature Granulocytes 0.07 0.00 - 0.07 K/uL    Comment: Performed at [facility], [address] [place], [place] [address]  Comprehensive metabolic panel     Status: Abnormal   Collection Time: 07/12/18  8:57 PM  Result Value Ref Range   Sodium 137 135 - 145 mmol/L   Potassium 4.1 3.5 - 5.1 mmol/L   Chloride 107 98 - 111 mmol/L   CO2 21 (L) 22 - 32 mmol/L   Glucose, Bld 128 (H) 70 - 99 mg/dL   BUN 17 6 - 20 mg/dL   Creatinine, Ser 0.85 0.44 - 1.00 mg/dL   Calcium 8.6 (L) 8.9 - 10.3 mg/dL   Total Protein 6.5 6.5 - 8.1 g/dL   Albumin 3.3 (L) 3.5 - 5.0 g/dL   AST 19 15 - 41 U/L   ALT 12 0 - 44 U/L   Alkaline Phosphatase 68 38 - 126 U/L   Total Bilirubin 0.3 0.3 - 1.2 mg/dL   GFR calc non Af Amer >60 >60 mL/min   GFR calc Af Amer >60 >60 mL/min   Anion gap 9 5 - 15    Comment: Performed at [facility], [address] [place], [place] [address]  Troponin I - ONCE - STAT     Status: None   Collection Time: 07/12/18  8:57 PM  Result Value Ref Range   Troponin I <0.03 <0.03 ng/mL    Comment: Performed at [facility], [address] [place], [place] [address]  HIV antibody (Routine Testing)     Status: None   Collection Time: 07/12/18  8:57 PM  Result Value Ref Range   HIV Screen 4th Generation wRfx Non Reactive Non Reactive    Comment: (NOTE) Performed At: [facility] [place], [place] [id number] Rush Farmer MD [id number]   Triglycerides     Status: None   Collection Time: 07/12/18  8:57 PM  Result Value Ref Range   Triglycerides 82 <150 mg/dL    Comment: Performed at [facility], [address] [place],  [address]  Urine Drug Screen, Qualitative     Status: None   Collection Time: 07/13/18  3:53 AM  Result Value Ref Range   Tricyclic, Ur Screen NONE DETECTED NONE DETECTED   Amphetamines, Ur Screen NONE DETECTED NONE DETECTED   MDMA (Ecstasy)Ur Screen NONE DETECTED NONE  DETECTED   Cocaine Metabolite,Ur [place] NONE DETECTED NONE DETECTED  Opiate, Ur Screen NONE DETECTED NONE DETECTED   Phencyclidine (PCP) Ur S NONE DETECTED NONE DETECTED   Cannabinoid 50 Ng, Ur [place] NONE DETECTED NONE DETECTED   Barbiturates, Ur Screen NONE DETECTED NONE DETECTED   Benzodiazepine, Ur Scrn NONE DETECTED NONE DETECTED   Methadone Scn, Ur NONE DETECTED NONE DETECTED    Comment: (NOTE) Tricyclics + metabolites, urine    Cutoff 1000 ng/mL Amphetamines + metabolites, urine  Cutoff 1000 ng/mL MDMA (Ecstasy), urine              Cutoff 500 ng/mL Cocaine Metabolite, urine          Cutoff 300 ng/mL Opiate + metabolites, urine        Cutoff 300 ng/mL Phencyclidine (PCP), urine         Cutoff 25 ng/mL Cannabinoid, urine                 Cutoff 50 ng/mL Barbiturates + metabolites, urine  Cutoff 200 ng/mL Benzodiazepine, urine              Cutoff 200 ng/mL Methadone, urine                   Cutoff 300 ng/mL The urine drug screen provides only a preliminary, unconfirmed analytical test result and should not be used for non-medical purposes. Clinical consideration and professional judgment should be applied to any positive drug screen result due to possible interfering substances. A more specific alternate chemical method must be used in order to obtain a confirmed analytical result. Gas chromatography / mass spectrometry (GC/MS) is the preferred confirmat ory method. Performed at [facility], [address] [place], [place] [address]   Urinalysis, Routine w reflex microscopic     Status: Abnormal   Collection Time: 07/13/18  3:53 AM  Result Value Ref Range   Color, Urine STRAW (A) YELLOW   APPearance CLEAR (A) CLEAR   Specific Gravity, Urine 1.005 1.005 - 1.030   pH 6.0 5.0 - 8.0   Glucose, UA NEGATIVE NEGATIVE mg/dL   Hgb urine dipstick SMALL (A) NEGATIVE   Bilirubin Urine NEGATIVE NEGATIVE   Ketones, ur NEGATIVE NEGATIVE mg/dL   Protein, ur NEGATIVE NEGATIVE mg/dL    Nitrite NEGATIVE NEGATIVE   Leukocytes, UA NEGATIVE NEGATIVE   RBC / HPF 0-5 0 - 5 RBC/hpf   WBC, UA 0-5 0 - 5 WBC/hpf   Bacteria, UA RARE (A) NONE SEEN   Squamous Epithelial / LPF 0-5 0 - 5    Comment: Performed at [facility], [place], [place] [address]     PHQ2/9: Depression screen [facility] 2/9 07/18/2018 05/30/2018  Decreased Interest 0 3  Down, Depressed, Hopeless 0 2  PHQ - 2 Score 0 5  Altered sleeping 2 2  Tired, decreased energy 2 2  Change in appetite 0 2  Feeling bad or failure about yourself  0 2  Trouble concentrating 0 0  Moving slowly or fidgety/restless 0 0  Suicidal thoughts 0 1  PHQ-9 Score 4 14  Difficult doing work/chores Not difficult at all Somewhat difficult    Fall Risk: Fall Risk  07/18/2018 05/30/2018  Falls in the past year? 0 No  Number falls in past yr: 0 -  Injury with Fall? 0 -    Functional Status Survey: Is the patient deaf or have difficulty hearing?: No Does the patient have difficulty seeing, even when wearing glasses/contacts?: No Does the patient have difficulty concentrating, remembering, or making  decisions?: No Does the patient have difficulty walking or climbing stairs?: Yes Does the patient have difficulty dressing or bathing?: No Does the patient have difficulty doing errands alone such as visiting a doctor's office or shopping?: No    Assessment & Plan  1. Hemiparesis affecting right side as late effect of cerebrovascular accident (CVA) ([place])  Continue medications  - Ambulatory referral to Neurology - Ambulatory referral to [place]

## 2018-07-19 ENCOUNTER — Telehealth: Payer: Self-pay | Admitting: Family Medicine

## 2018-07-19 NOTE — Telephone Encounter (Signed)
Copied from [place] [id number]. Topic: Quick Communication - See Telephone Encounter >> Jul 19, 2018  2:49 PM Burchel, Abbi R wrote: CRM for notification. See Telephone encounter for: 07/19/18.  Pt would like to speak with Tiffany (Dr Ancil Boozer' nurse) re: neurology referral  [phone]

## 2018-07-19 NOTE — Telephone Encounter (Signed)
Since [name] is out thought you may be able to help and answer her questions

## 2018-07-20 NOTE — Telephone Encounter (Signed)
[  07/20/2018 4:20 PM]  Parke Poisson:   We got a call from [facility] today stating that they declined referral. Lenna Sciara will be working on getting another company to take care of it

## 2018-07-20 NOTE — Telephone Encounter (Signed)
Spoke to patient, she has an appointment with neurologist in January but has not heard from PT, please contact [facility] to find out when they are going to see her. Thank you

## 2018-07-20 NOTE — Telephone Encounter (Signed)
Please let patient know we are working on it . Thank you

## 2018-07-21 ENCOUNTER — Telehealth: Payer: Self-pay | Admitting: Family Medicine

## 2018-07-21 NOTE — Telephone Encounter (Signed)
Patient called.  Patient aware.  

## 2018-07-21 NOTE — Telephone Encounter (Signed)
Copied from [place] [phone]. Topic: General - Other >> Jul 21, 2018  1:43 PM Nils Flack wrote: Reason for CRM: pt home health care referral start date will be 07/27/18.  [facility] is reaching out to pt.  This was the only home health agency that would take her insurance.

## 2018-07-26 NOTE — Care Management (Signed)
Received telephone ll back from Ms. [place]. States that no one from physical therapy has showed up at her home.  Chart rewiew  completed. Discussed outpatient services. Agreed to ciome to this facility. Will ask Dr. Tressia Miners to sign outpatient therapy order.Will fax to rehab. Department. Shelbie Ammons RN MSN CCM Care Management [phone]

## 2018-07-26 NOTE — Care Management (Addendum)
Late note entry 07/26/18- RNCM received call from Outpatient PT noting that [facility] was scheduled to see patient for home PT. I spoke with Helene Kelp with [facility] and they have not received insurance authorization and will not start services on this patient as noted from PCP office.  [facility] outpatient PT department has order and can see patient however they have not been able to reach patient and neither could I to see if she has transportation.  Message left for listed emergency contact/husband to call this RNCM. Update: Patient called RNCM and updated- she agrees to outpatient PT and has transportation. PT updated.

## 2018-07-27 ENCOUNTER — Telehealth: Payer: Self-pay | Admitting: Family Medicine

## 2018-07-27 NOTE — Telephone Encounter (Signed)
Copied from [place] [phone]. Topic: Quick Communication - Home Health Verbal Orders >> Jul 27, 2018  9:07 AM Lennox Solders wrote: Caller/Agency: Darlina Guys lpn [facility] Callback Number: [phone]. Darlina Guys is calling to let dr Ancil Boozer know they will see pt on 07-29-18 for start of care

## 2018-07-28 ENCOUNTER — Other Ambulatory Visit: Payer: Self-pay | Admitting: Family Medicine

## 2018-07-28 DIAGNOSIS — I69351 Hemiplegia and hemiparesis following cerebral infarction affecting right dominant side: Secondary | ICD-10-CM

## 2018-08-04 ENCOUNTER — Ambulatory Visit: Payer: BLUE CROSS/BLUE SHIELD | Admitting: Oncology

## 2018-08-11 ENCOUNTER — Telehealth: Payer: Self-pay

## 2018-08-11 NOTE — Telephone Encounter (Signed)
If she lost consciousness or has any injuries, she needs to come in sooner. Otherwise we will just take note and discuss at her follow up.

## 2018-08-11 NOTE — Telephone Encounter (Signed)
Copied from [place] [phone]. Topic: General - Other >> Aug 11, 2018  2:17 PM Keene Breath wrote: Reason for CRM: Patient called to leave a message for the doctor to let her know that she had fallen on Monday, 08/08/18.  Patient stated that she is ok and does have an appointment with Dr. Ancil Boozer the end of the month.  If there are any questions, please call patient at [phone]

## 2018-08-12 NOTE — Telephone Encounter (Signed)
Patient states that her leg is hurting. She said she would schedule an appointment sooner if she thinks she needs to.

## 2018-08-16 ENCOUNTER — Encounter (INDEPENDENT_AMBULATORY_CARE_PROVIDER_SITE_OTHER): Payer: Self-pay

## 2018-08-16 ENCOUNTER — Encounter: Payer: Self-pay | Admitting: Oncology

## 2018-08-16 ENCOUNTER — Inpatient Hospital Stay: Payer: BLUE CROSS/BLUE SHIELD | Attending: Oncology | Admitting: Oncology

## 2018-08-16 VITALS — BP 128/77 | HR 84 | Temp 97.5°F | Resp 20 | Ht 59.0 in | Wt 303.4 lb

## 2018-08-16 DIAGNOSIS — D72829 Elevated white blood cell count, unspecified: Secondary | ICD-10-CM | POA: Diagnosis present

## 2018-08-16 DIAGNOSIS — Z808 Family history of malignant neoplasm of other organs or systems: Secondary | ICD-10-CM | POA: Diagnosis not present

## 2018-08-16 DIAGNOSIS — R5381 Other malaise: Secondary | ICD-10-CM | POA: Diagnosis not present

## 2018-08-16 DIAGNOSIS — Z7982 Long term (current) use of aspirin: Secondary | ICD-10-CM

## 2018-08-16 DIAGNOSIS — Z7952 Long term (current) use of systemic steroids: Secondary | ICD-10-CM | POA: Diagnosis not present

## 2018-08-16 DIAGNOSIS — D721 Eosinophilia: Secondary | ICD-10-CM | POA: Diagnosis not present

## 2018-08-16 DIAGNOSIS — R5383 Other fatigue: Secondary | ICD-10-CM

## 2018-08-16 DIAGNOSIS — Z79899 Other long term (current) drug therapy: Secondary | ICD-10-CM | POA: Diagnosis not present

## 2018-08-16 DIAGNOSIS — Z800 Family history of malignant neoplasm of digestive organs: Secondary | ICD-10-CM | POA: Insufficient documentation

## 2018-08-16 DIAGNOSIS — J45909 Unspecified asthma, uncomplicated: Secondary | ICD-10-CM | POA: Insufficient documentation

## 2018-08-16 DIAGNOSIS — R635 Abnormal weight gain: Secondary | ICD-10-CM | POA: Insufficient documentation

## 2018-08-16 DIAGNOSIS — E669 Obesity, unspecified: Secondary | ICD-10-CM | POA: Insufficient documentation

## 2018-08-16 DIAGNOSIS — Z801 Family history of malignant neoplasm of trachea, bronchus and lung: Secondary | ICD-10-CM

## 2018-08-16 NOTE — Progress Notes (Signed)
Patient here for follow up. She continues to have right sided pain which she states is secondary to a stroke she had in December. She has been started on 81mg  aspirin and 40mg  of acrivastine since her last visit.

## 2018-08-17 ENCOUNTER — Encounter: Payer: Self-pay | Admitting: Physical Therapy

## 2018-08-17 ENCOUNTER — Ambulatory Visit: Payer: BLUE CROSS/BLUE SHIELD | Attending: Family Medicine | Admitting: Physical Therapy

## 2018-08-17 ENCOUNTER — Other Ambulatory Visit: Payer: Self-pay

## 2018-08-17 ENCOUNTER — Telehealth: Payer: Self-pay

## 2018-08-17 DIAGNOSIS — R2689 Other abnormalities of gait and mobility: Secondary | ICD-10-CM | POA: Insufficient documentation

## 2018-08-17 DIAGNOSIS — M6281 Muscle weakness (generalized): Secondary | ICD-10-CM | POA: Diagnosis present

## 2018-08-17 DIAGNOSIS — R262 Difficulty in walking, not elsewhere classified: Secondary | ICD-10-CM | POA: Insufficient documentation

## 2018-08-17 NOTE — Telephone Encounter (Signed)
Copied from [place] [phone]. Topic: General - Other >> Aug 17, 2018 12:20 PM Ivar Drape wrote: Reason for CRM: The patient went to Physical Therapy today and she was in a great deal of pain.  The Physical Therapist told her to get her PCP to call her something in for nerve pain.  Please send it to her preferred pharmacy [facility], [place].

## 2018-08-17 NOTE — Progress Notes (Signed)
Hematology/Oncology Consult note [facility]  Telephone:([phone] [phone]  Patient Care Team: Steele Sizer, MD as PCP - General (Family Medicine)   Name of the patient: [name]  [id number]  September 07, 1970   Date of visit: 08/17/18  Diagnosis-leukocytosis mainly neutrophilia and lymphocytosis likely reactive secondary to underlying asthma  Chief complaint/ Reason for visit-discuss results of blood work  Heme/Onc history: patient is a 48 year old female with a past medical history significant for obesity, asthma, fibroids who is been sent to [place] for evaluation of leukocytosis.  Of note patient has had chronic leukocytosis at least dating back to 2015 and a white count typically ranges around 12.  There are times when her white count was as high as 23 as well.  Patient does have a history of chronic asthma and is on steroids off and on for about 4 months of the year.  She sees Dr. Raul Del for the same.  She is currently on a steroid taper as well.  Recent CBC from 05/30/2018 showed white count of 15.1, H&H of 13.5/40.2 platelet count of 332.  Differential mainly showed neutrophilia along with lymphocytosis and eosinophilia.  She has had significant weight gain over the last 1 year.  Reports no and unintentional fatigue or drenching night sweats.  She is a lifelong non-smoker  Patient results of blood work from 07/01/2018 were as follows: CBC showed white count of 10.7, H&H of 12.4/38.1 and platelet count of 340.  Smear review was normal.  Flow cytometry did not reveal any immunophenotypic abnormality.  BCR able fish was negative for CML.  Interval history-patient was recently hospitalized a few days ago for an acute stroke.  She received TPA.  MRI of the brain was negative and she will be following up with neurology as an outpatient.  Her residual weakness has now resolved  ECOG PS- 1 Pain scale- 0   Review of systems- Review of Systems    Constitutional: Positive for malaise/fatigue. Negative for chills, fever and weight loss.  HENT: Negative for congestion, ear discharge and nosebleeds.   Eyes: Negative for blurred vision.  Respiratory: Negative for cough, hemoptysis, sputum production, shortness of breath and wheezing.   Cardiovascular: Negative for chest pain, palpitations, orthopnea and claudication.  Gastrointestinal: Negative for abdominal pain, blood in stool, constipation, diarrhea, heartburn, melena, nausea and vomiting.  Genitourinary: Negative for dysuria, flank pain, frequency, hematuria and urgency.  Musculoskeletal: Negative for back pain, joint pain and myalgias.  Skin: Negative for rash.  Neurological: Negative for dizziness, tingling, focal weakness, seizures, weakness and headaches.  Endo/Heme/Allergies: Does not bruise/bleed easily.  Psychiatric/Behavioral: Negative for depression and suicidal ideas. The patient does not have insomnia.       Allergies  Allergen Reactions  . Advil [Ibuprofen] Other (See Comments)    Other Reaction: Other reaction-swells  . Percocet [Oxycodone-Acetaminophen] Nausea And Vomiting and Other (See Comments)    headaches     Past Medical History:  Diagnosis Date  . Asthma   . Difficulty breathing   . Family history of breast cancer    BRCA testing letter sent  . Sinus complaint      Past Surgical History:  Procedure Laterality Date  . CESAREAN SECTION  1989  . COLONOSCOPY    . FOOT SURGERY Left 2013  . HERNIA REPAIR      Social History   Socioeconomic History  . Marital status: Married    Spouse name: Stage manager  . Number of children:  1  . Years of education: Not on file  . Highest education level: Some college, no degree  Occupational History  . Occupation: customer services   Social Needs  . Financial resource strain: Not hard at all  . Food insecurity:    Worry: Never true    Inability: Never true  . Transportation needs:    Medical: No     Non-medical: No  Tobacco Use  . Smoking status: Never Smoker  . Smokeless tobacco: Never Used  Substance and Sexual Activity  . Alcohol use: Yes    Comment: occasionally  . Drug use: No  . Sexual activity: Yes    Partners: Male    Birth control/protection: Pill  Lifestyle  . Physical activity:    Days per week: 5 days    Minutes per session: 30 min  . Stress: Only a little  Relationships  . Social connections:    Talks on phone: More than three times a week    Gets together: Twice a week    Attends religious service: More than 4 times per year    Active member of club or organization: No    Attends meetings of clubs or organizations: Never    Relationship status: Married  . Intimate partner violence:    Fear of current or ex partner: No    Emotionally abused: No    Physically abused: No    Forced sexual activity: No  Other Topics Concern  . Not on file  Social History Narrative   Married with her high school sweet heart , they had first child at age 77, they recently got married ( on their daughter's birthday 2019), she two miscarriages    Family History  Problem Relation Age of Onset  . Diabetes Mother   . Uterine cancer Mother   . Colon cancer Mother   . Cancer - Lung Father   . Colon cancer Father   . Diverticulitis Brother   . Hernia Brother        x2 surgeries  . Colon cancer Paternal Grandmother   . Lung cancer Paternal Grandmother      Current Outpatient Medications:  .  Albuterol Sulfate (VENTOLIN HFA IN), Inhale into the lungs as needed (for shortness of breath). , Disp: , Rfl:  .  aspirin 81 MG chewable tablet, Chew 1 tablet (81 mg total) by mouth daily., Disp: 30 tablet, Rfl: 0 .  atorvastatin (LIPITOR) 40 MG tablet, Take 1 tablet by mouth daily., Disp: , Rfl:  .  Cinnamon 500 MG TABS, Take 2 tablets by mouth daily., Disp: , Rfl:  .  mometasone-formoterol (DULERA) 200-5 MCG/ACT AERO, Inhale 2 puffs into the lungs 2 (two) times daily., Disp: , Rfl:  .   montelukast (SINGULAIR) 10 MG tablet, Take 1 tablet by mouth daily., Disp: , Rfl:  .  Multiple Vitamins-Calcium (ONE-A-DAY WITHIN PO), Take 1 tablet by mouth daily., Disp: , Rfl:  .  Omega 3 1000 MG CAPS, Take 1 capsule by mouth daily., Disp: , Rfl:  .  vitamin B-12 (CYANOCOBALAMIN) 1000 MCG tablet, Take 1,000 mcg by mouth daily., Disp: , Rfl:  .  Vitamin D, Ergocalciferol, (DRISDOL) 1.25 MG (50000 UT) CAPS capsule, Take 1 capsule (50,000 Units total) by mouth every 7 (seven) days. For 3 months., Disp: 4 capsule, Rfl: 2  Physical exam:  Vitals:   08/16/18 1102 08/16/18 1110  BP:  128/77  Pulse:  84  Resp: 20   Temp:  (!) 97.5 F (36.4  C)  TempSrc:  Tympanic  Weight: (!) 303 lb 6.4 oz (137.6 kg)   Height: 4' 11"  (1.499 m)    Physical Exam Constitutional:      Comments: Patient is obese.  Does not appear to be in any acute distress  HENT:     Head: Normocephalic and atraumatic.  Eyes:     Pupils: Pupils are equal, round, and reactive to light.  Neck:     Musculoskeletal: Normal range of motion.  Cardiovascular:     Rate and Rhythm: Normal rate and regular rhythm.     Heart sounds: Normal heart sounds.  Pulmonary:     Effort: Pulmonary effort is normal.     Breath sounds: Normal breath sounds.  Abdominal:     General: Bowel sounds are normal.     Palpations: Abdomen is soft.  Skin:    General: Skin is warm and dry.  Neurological:     Mental Status: She is alert and oriented to person, place, and time.      CMP Latest Ref Rng & Units 07/12/2018  Glucose 70 - 99 mg/dL 128(H)  BUN 6 - 20 mg/dL 17  Creatinine 0.44 - 1.00 mg/dL 0.85  Sodium 135 - 145 mmol/L 137  Potassium 3.5 - 5.1 mmol/L 4.1  Chloride 98 - 111 mmol/L 107  CO2 22 - 32 mmol/L 21(L)  Calcium 8.9 - 10.3 mg/dL 8.6(L)  Total Protein 6.5 - 8.1 g/dL 6.5  Total Bilirubin 0.3 - 1.2 mg/dL 0.3  Alkaline Phos 38 - 126 U/L 68  AST 15 - 41 U/L 19  ALT 0 - 44 U/L 12   CBC Latest Ref Rng & Units 07/12/2018  WBC 4.0  - 10.5 K/uL 13.0(H)  Hemoglobin 12.0 - 15.0 g/dL 12.8  Hematocrit 36.0 - 46.0 % 39.3  Platelets 150 - 400 K/uL 441(H)    No images are attached to the encounter.  No results found.   Assessment and plan- Patient is a 49 y.o. female and referred for leukocytosis mainly neutrophilia and lymphocytosis  I discussed the results of the blood work with the patient which did not reveal any immunophenotypic abnormality in her flow cytometry.  BCR able fish testing was also negative.  I suspect this is reactive secondary to her underlying asthma.  Patient has had chronic leukocytosis dating back to the last 3 to 4 years.  I will therefore see her back in 1 years time with a repeat CBC with differential  With regards to her acute stroke patient will be following up with neurology as an outpatient.  I will defer further work-up to neurology at this point.  She can be referred back to [place] in the future if there are any concerns for hypercoagulable state causing her stroke   Visit Diagnosis 1. Leukocytosis, unspecified type      Dr. Randa Evens, MD, MPH [facility] at [facility] [phone] 08/17/2018 11:35 AM

## 2018-08-17 NOTE — Therapy (Signed)
[place] MAIN [facility] SERVICES [address] [place], [place], [address] Phone: [phone]   Fax:  [phone]  Physical Therapy Evaluation  Patient Details  Name: [name] MRN: [id number] Date of Birth: 11-08-1970 Referring Provider (PT): sowles Drue Stager   Encounter Date: 08/17/2018  PT End of Session - 08/17/18 0824    Visit Number  1    Number of Visits  17    Date for PT Re-Evaluation  10/12/18    PT Start Time  0810    PT Stop Time  0900    PT Time Calculation (min)  50 min    Equipment Utilized During Treatment  Gait belt    Activity Tolerance  Patient tolerated treatment well    Behavior During Therapy  [facility] for tasks assessed/performed       Past Medical History:  Diagnosis Date  . Asthma   . Difficulty breathing   . Family history of breast cancer    BRCA testing letter sent  . Sinus complaint     Past Surgical History:  Procedure Laterality Date  . CESAREAN SECTION  1989  . COLONOSCOPY    . FOOT SURGERY Left 2013  . HERNIA REPAIR      There were no vitals filed for this visit.   Subjective Assessment - 08/17/18 0816    Subjective  Patient had a CVA Jul 11, 2018.     Currently in Pain?  Yes    Pain Score  7     Pain Location  Leg    Pain Orientation  Right    Pain Descriptors / Indicators  Aching    Pain Radiating Towards  leg    Pain Onset  More than a month ago    Pain Frequency  Constant    Aggravating Factors   walking    Pain Relieving Factors  rest    Effect of Pain on Daily Activities  activity    Multiple Pain Sites  --   RUE 7/10, back 7/10        [employer] PT Assessment - 08/17/18 0818      Assessment   Medical Diagnosis  cva    Referring Provider (PT)  sowles Drue Stager    Onset Date/Surgical Date  07/11/18    Hand Dominance  Left    Prior Therapy  none      Precautions   Precautions  Fall      Restrictions   Weight Bearing Restrictions  No      Balance Screen   Has the  patient fallen in the past 6 months  Yes    How many times?  1    Has the patient had a decrease in activity level because of a fear of falling?   Yes    Is the patient reluctant to leave their home because of a fear of falling?   No      Home Environment   Living Environment  Private residence    Living Arrangements  Spouse/significant other    Available Help at Discharge  Family    Type of [place] to enter    Entrance Stairs-Number of Steps  1    [place]  One level    [place] - 2 wheels      Prior Function   Level of Independence  Independent with basic ADLs;Needs  assistance with ADLs;Needs assistance with homemaking    Vocation  Full time employment    Vocation Requirements  --   stand 8 hours   Leisure  --   shop, movies, exercise, read books     Cognition   Overall Cognitive Status  Within Functional Limits for tasks assessed        PAIN: Patient has pain in RUE, RLE and low back 7/10 that is constant  POSTURE: flexed trunk    PROM/AROM: R shoulder flex 70 degs, abd 72 deg with pain, elbow has ROM WNL except supination is limited to 30 deg due to pain, wrist flex is 20 degs,  RUE is slow movement and pain limits motion  RLE pain limits movement and PROM : right hip flex 90 deg,   STRENGTH:  Graded on a 0-5 scale Muscle Group Left Right  Shoulder flex 5/5 2/5  Shoulder Abd 5/5 2/5  Shoulder Ext 5/5 2/5  Shoulder IR/ER 5/5 2/5  Elbow 5/5 3/5  Wrist/hand 5/5 2/5  Hip Flex 5/5 2/5  Hip Abd 5/5 2/5  Hip Add 5/5 2/5  Hip Ext 5/5 2/5  Hip IR/ER 5/5 2/5  Knee Flex 5/5 3/5  Knee Ext 5/5 3/5  Ankle DF 5/5 4/5  Ankle PF 5/5 4/5   SENSATION: tingling R elbow/shoulder Tingling and numbness R calf/shin intermittent   FUNCTIONAL MOBILITY:  Min assist for rolling, supine sit independent, transfers independent    BALANCE:  Standing Dynamic Balance  Normal Stand independently unsupported,  able to weight shift and cross midline maximally   Good Stand independently unsupported, able to weight shift and cross midline moderately   Good-/Fair+ Stand independently unsupported, able to weight shift across midline minimally x  Fair Stand independently unsupported, weight shift, and reach ipsilaterally, loss of balance when crossing midline   Poor+ Able to stand with Min A and reach ipsilaterally, unable to weight shift   Poor Able to stand with Mod A and minimally reach ipsilaterally, unable to cross midline.    Static Standing Balance  Normal Able to maintain standing balance against maximal resistance   Good Able to maintain standing balance against moderate resistance   Good-/Fair+ Able to maintain standing balance against minimal resistance x  Fair Able to stand unsupported without UE support and without LOB for 1-2 min   Fair- Requires Min A and UE support to maintain standing without loss of balance   Poor+ Requires mod A and UE support to maintain standing without loss of balance   Poor Requires max A and UE support to maintain standing balance without loss      GAIT: Patient has limp and decreased gait speed with Rw .17 m/sec  OUTCOME MEASURES: TEST Outcome Interpretation  5 times sit<>stand 35.67sec >49 yo, >15 sec indicates increased risk for falls  10 meter walk test    .17             m/s <1.0 m/s indicates increased risk for falls; limited community ambulator  Timed up and Go  29.92               sec <14 sec indicates increased risk for falls  6 minute walk test                Feet 1000 feet is community ambulator      [address] Peg Test L:   28.11             R: 46.54  Objective measurements completed on examination: See above findings.              PT Education - 08/17/18 [phone]    Education Details  plan of care    Person(s) Educated  Patient    Methods  Explanation    Comprehension  Verbalized understanding       PT Short Term  Goals - 08/17/18 0953      PT SHORT TERM GOAL #1   Title  Patient will report a worst pain of 3/10 on VAS in  right UE and right LE and back to improve tolerance with ADLs and reduced symptoms with activities.     Time  4    Period  Weeks    Status  New    Target Date  09/14/18      PT SHORT TERM GOAL #2   Title  Patient will be able to perform household work/ chores without increase in symptoms.    Time  4    Period  Weeks    Status  New    Target Date  09/14/18        PT Long Term Goals - 08/17/18 0951      PT LONG TERM GOAL #1   Title  Patient will be independent in home exercise program to improve strength/mobility for better functional independence with ADLs    Time  8    Period  Weeks    Status  New    Target Date  10/12/18      PT LONG TERM GOAL #2   Title  Patient (< 53 years old) will complete five times sit to stand test in < 10 seconds indicating an increased LE strength and improved balance.    Time  8    Period  Weeks    Status  New    Target Date  10/12/18      PT LONG TERM GOAL #3   Title  Patient will increase six minute walk test distance to >1000 for progression to community ambulator and improve gait ability    Time  8    Period  Weeks    Status  New    Target Date  10/12/18      PT LONG TERM GOAL #4   Title  Patient will reduce timed up and go to <11 seconds to reduce fall risk and demonstrate improved transfer/gait ability.    Time  8    Period  Weeks    Status  New    Target Date  10/12/18             Plan - 08/17/18 0940    Clinical Impression Statement  Patient had CVA 07/11/18 and presents with RUE, RLE and back pain 7/10. She has decreased strength that is limited by pain, decreased static and dynamic standing balance, decreased ROM,, decreased gait with RW and limp. She has decreased outcome measures that indicate a falls risk. She will benefit from skilled PT to     Clinical Decision Making  Moderate    Rehab Potential  Good     PT Frequency  2x / week    PT Duration  8 weeks    PT Treatment/Interventions  Aquatic Therapy;Electrical Stimulation;Cryotherapy;Moist Heat;Gait training;Functional mobility training;Therapeutic exercise;Therapeutic activities;Balance training;Patient/family education;Neuromuscular re-education;Passive range of motion;Dry needling    PT Next Visit Plan  mobility training, gait training    PT Home Exercise Plan  patient arrived late, need to do at next  visit    Recommended Other Services  OT    Consulted and Agree with Plan of Care  Patient       Patient will benefit from skilled therapeutic intervention in order to improve the following deficits and impairments:  Difficulty walking, Decreased mobility, Pain, Postural dysfunction, Impaired UE functional use, Impaired flexibility, Decreased strength, Decreased coordination, Decreased activity tolerance, Obesity, Abnormal gait, Decreased balance  Visit Diagnosis: Muscle weakness (generalized)  Difficulty in walking, not elsewhere classified  Other abnormalities of gait and mobility     Problem List Patient Active Problem List   Diagnosis Date Noted  . CVA (cerebral vascular accident) ([place]) 07/11/2018  . Thrombocytosis ([place]) 05/30/2018  . Lymphedema of both lower extremities 05/30/2018  . Moderate episode of recurrent major depressive disorder ([place]) 05/30/2018  . Pure hypercholesterolemia 05/30/2018  . Menorrhagia with regular cycle 10/12/2017  . Morbid obesity ([place]) 01/27/2017  . History of vitamin D deficiency 01/27/2017  . History of hypercholesterolemia 01/27/2017  . Fibroids 01/27/2017  . Asthma, well controlled 01/15/2014    Alanson Puls, [place] DPT 08/17/2018, 10:04 AM  [place] MAIN [facility] SERVICES [address] [place], [place], [address] Phone: [phone]   Fax:  [phone]  Name: Ilayda Toda MRN: [id number] Date of Birth: 01-23-1971

## 2018-08-17 NOTE — Telephone Encounter (Signed)
She will have to come in to discuss options and side effects. Can she be seen by me next week, or Emily sooner.  Options are Elavil, Gabapentin and Lyrica

## 2018-08-18 ENCOUNTER — Ambulatory Visit: Payer: BLUE CROSS/BLUE SHIELD | Admitting: Physical Therapy

## 2018-08-22 ENCOUNTER — Ambulatory Visit: Payer: BLUE CROSS/BLUE SHIELD

## 2018-08-22 DIAGNOSIS — R262 Difficulty in walking, not elsewhere classified: Secondary | ICD-10-CM

## 2018-08-22 DIAGNOSIS — M6281 Muscle weakness (generalized): Secondary | ICD-10-CM

## 2018-08-22 NOTE — Telephone Encounter (Signed)
Patient would prefer to see Dr. Ancil Boozer rather than the Nurse Practitioner.  So she is waiting until 09/07/18 or earlier if there is a cancelation.

## 2018-08-22 NOTE — Therapy (Signed)
[place] MAIN [facility] SERVICES [address] [place], [place], [address] Phone: [phone]   Fax:  [phone]  Physical Therapy Treatment  Patient Details  Name: [name] MRN: [id number] Date of Birth: [birth year] Referring Provider (PT): sowles Drue Stager   Encounter Date: 08/22/2018  PT End of Session - 08/22/18 0805    Visit Number  2    Number of Visits  17    Date for PT Re-Evaluation  10/12/18    PT Start Time  0802    PT Stop Time  0845    PT Time Calculation (min)  43 min    Equipment Utilized During Treatment  Gait belt    Activity Tolerance  Patient tolerated treatment well    Behavior During Therapy  WFL for tasks assessed/performed       Past Medical History:  Diagnosis Date  . Asthma   . Difficulty breathing   . Family history of breast cancer    BRCA testing letter sent  . Sinus complaint     Past Surgical History:  Procedure Laterality Date  . CESAREAN SECTION  1989  . COLONOSCOPY    . FOOT SURGERY Left 2013  . HERNIA REPAIR      There were no vitals filed for this visit.  Subjective Assessment - 08/22/18 0804    Subjective  Pt reports that she is doing well today. She complains of R knee pain which has been present since her CVA. She also reports R shoulder pain with attempted AROM but no resting pain.     Pertinent History  Pt arrived at [facility] on 07/11/18 with complains of weakness in her right arm and leg while at work. She arrived via ambulance to [facility] and received tPA. MRI, MRA and CT all negative, echo normal. She was sent home on Atorvastatin and aspirin since discharge. She was supposed to have home PT but [facility] declined her referral. Patient is still having weakness on right side of her body, tingling and has difficulty ambulating. She also states has pain. She is requiring assistance with bathing, cannot clean her house and mother or husband are driving her around.    Currently in  Pain?  Yes    Pain Score  7     Pain Location  Knee    Pain Orientation  Right    Pain Descriptors / Indicators  Throbbing    Pain Type  Acute pain    Pain Onset  1 to 4 weeks ago        TREATMENT   Ther-ex  NuStep warm-up during history L0 x 5 minutes;  Hooklying R SLR 2 x 10; Hooklying, bridge 2 x 10; Hooklying R hip SLR abduction with manual resistance from therapist x 10; Hooklying clam with manual resistance 2 x 10; Hooklying adduction squeeze with manual resistance 2 x 10; Sit to stand without UE support x 10; Discussion regarding recent medication addition of OCP. Pt is unable to recall name. Encouraged pt to follow-up with prescribing OB/GYN.   Pt educated throughout session about proper posture and technique with exercises. Improved exercise technique, movement at target joints, use of target muscles after min to mod verbal, visual, tactile cues.     Initiated HEP with patient including pulleys for AAROM R shoulder flexion, hooklying R SLR, and L sidelying clams. Per evaluating therapist OT referral has been initiated. When reviewing history pt reports that she started taking oral contraceptives 1 month prior  to her CVA. She is unable to recall what medication she was prescribed. 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 Pt states that she did not report this medication during admission or during her post-hospital follow-up with PCP. Message sent to her OB/GYN who is the prescribing MD to notify of CVA and pt encouraged to follow-up with their office. Pt limited in exercise today due to L knee pain. She requires frequent rest breaks. Pt encouraged to initiate HEP and follow-up as scheduled.                       PT Short Term Goals - 08/17/18 5009      PT SHORT TERM GOAL #1   Title  Patient will report a worst pain of 3/10 on VAS in  right UE and right LE and back to improve tolerance with ADLs and reduced symptoms with  activities.     Time  4    Period  Weeks    Status  New    Target Date  09/14/18      PT SHORT TERM GOAL #2   Title  Patient will be able to perform household work/ chores without increase in symptoms.    Time  4    Period  Weeks    Status  New    Target Date  09/14/18        PT Long Term Goals - 08/17/18 0951      PT LONG TERM GOAL #1   Title  Patient will be independent in home exercise program to improve strength/mobility for better functional independence with ADLs    Time  8    Period  Weeks    Status  New    Target Date  10/12/18      PT LONG TERM GOAL #2   Title  Patient (< 70 years old) will complete five times sit to stand test in < 10 seconds indicating an increased LE strength and improved balance.    Time  8    Period  Weeks    Status  New    Target Date  10/12/18      PT LONG TERM GOAL #3   Title  Patient will increase six minute walk test distance to >1000 for progression to community ambulator and improve gait ability    Time  8    Period  Weeks    Status  New    Target Date  10/12/18      PT LONG TERM GOAL #4   Title  Patient will reduce timed up and go to <11 seconds to reduce fall risk and demonstrate improved transfer/gait ability.    Time  8    Period  Weeks    Status  New    Target Date  10/12/18            Plan - 08/22/18 0808    Clinical Impression Statement  Initiated HEP with patient including pulleys for AAROM R shoulder flexion, hooklying R SLR, and L sidelying clams. Per evaluating therapist OT referral has been initiated. When reviewing history pt reports that she started taking oral contraceptives 1 month prior to her CVA. She is unable to recall what medication she was prescribed. 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 Pt states that she did not report this medication during admission or during her post-hospital follow-up with PCP. Message sent to  her OB/GYN who is the prescribing MD to notify of CVA and  pt encouraged to follow-up with their office. Pt limited in exercise today due to L knee pain. She requires frequent rest breaks. Pt encouraged to initiate HEP and follow-up as scheduled.     Rehab Potential  Good    PT Frequency  2x / week    PT Duration  8 weeks    PT Treatment/Interventions  Aquatic Therapy;Electrical Stimulation;Cryotherapy;Moist Heat;Gait training;Functional mobility training;Therapeutic exercise;Therapeutic activities;Balance training;Patient/family education;Neuromuscular re-education;Passive range of motion;Dry needling    PT Next Visit Plan  mobility training, gait training    PT Home Exercise Plan  R Hooklying SLR, L sidelying R clams, overhead pulleys for R shoulder AAROM    Consulted and Agree with Plan of Care  Patient       Patient will benefit from skilled therapeutic intervention in order to improve the following deficits and impairments:  Difficulty walking, Decreased mobility, Pain, Postural dysfunction, Impaired UE functional use, Impaired flexibility, Decreased strength, Decreased coordination, Decreased activity tolerance, Obesity, Abnormal gait, Decreased balance  Visit Diagnosis: Muscle weakness (generalized)  Difficulty in walking, not elsewhere classified     Problem List Patient Active Problem List   Diagnosis Date Noted  . CVA (cerebral vascular accident) ([place]) 07/11/2018  . Thrombocytosis ([place]) 05/30/2018  . Lymphedema of both lower extremities 05/30/2018  . Moderate episode of recurrent major depressive disorder ([place]) 05/30/2018  . Pure hypercholesterolemia 05/30/2018  . Menorrhagia with regular cycle 10/12/2017  . Morbid obesity ([place]) 01/27/2017  . History of vitamin D deficiency 01/27/2017  . History of hypercholesterolemia 01/27/2017  . Fibroids 01/27/2017  . Asthma, well controlled 01/15/2014   Phillips Grout PT, DPT, GCS  , 08/22/2018, 12:57 PM  [place] MAIN [facility]  SERVICES [address] [place], [place], [address] Phone: [phone]   Fax:  [phone]  Name: [name] MRN: [id number] Date of Birth: 1971/08/09

## 2018-08-22 NOTE — Patient Instructions (Signed)
Access Code: [email or web address]  URL: [email or web address]  Date: 08/22/2018  Prepared by: Roxana Hires   Exercises  Seated Shoulder Flexion AAROM with Pulley in [place] - 10 reps - 2 sets - 2x daily - 7x weekly  Supine Active Straight Leg Raise - 10 reps - 2 sets - 2x daily - 7x weekly  Clamshell - 10 reps - 2 sets - 2x daily - 7x weekly

## 2018-08-24 ENCOUNTER — Ambulatory Visit: Payer: BLUE CROSS/BLUE SHIELD | Admitting: Physical Therapy

## 2018-08-24 ENCOUNTER — Encounter: Payer: Self-pay | Admitting: Physical Therapy

## 2018-08-24 DIAGNOSIS — R2689 Other abnormalities of gait and mobility: Secondary | ICD-10-CM

## 2018-08-24 DIAGNOSIS — R262 Difficulty in walking, not elsewhere classified: Secondary | ICD-10-CM

## 2018-08-24 DIAGNOSIS — M6281 Muscle weakness (generalized): Secondary | ICD-10-CM

## 2018-08-24 NOTE — Therapy (Signed)
[place] MAIN [facility] SERVICES [address] [place], [place], [address] Phone: [phone]   Fax:  [phone]  Physical Therapy Treatment  Patient Details  Name: [name] MRN: [id number] Date of Birth: June 16, 1971 Referring Provider (PT): sowles Drue Stager   Encounter Date: 08/24/2018  PT End of Session - 08/24/18 0829    Visit Number  3    Number of Visits  17    Date for PT Re-Evaluation  10/12/18    PT Start Time  0800    PT Stop Time  0840    PT Time Calculation (min)  40 min    Equipment Utilized During Treatment  Gait belt    Activity Tolerance  Patient tolerated treatment well    Behavior During Therapy  WFL for tasks assessed/performed       Past Medical History:  Diagnosis Date  . Asthma   . Difficulty breathing   . Family history of breast cancer    BRCA testing letter sent  . Sinus complaint     Past Surgical History:  Procedure Laterality Date  . CESAREAN SECTION  1989  . COLONOSCOPY    . FOOT SURGERY Left 2013  . HERNIA REPAIR      There were no vitals filed for this visit.  Subjective Assessment - 08/24/18 0807    Subjective  Patient is having 7/10 pain in right shoulder and right knee. She is going to the walk in clinic today after therapy.     Pertinent History  Pt arrived at [facility] on 07/11/18 with complains of weakness in her right arm and leg while at work. She arrived via ambulance to [facility] and received tPA. MRI, MRA and CT all negative, echo normal. She was sent home on Atorvastatin and aspirin since discharge. She was supposed to have home PT but [facility] declined her referral. Patient is still having weakness on right side of her body, tingling and has difficulty ambulating. She also states has pain. She is requiring assistance with bathing, cannot clean her house and mother or husband are driving her around.    Pain Onset  1 to 4 weeks ago        Treatment: Patient has severe pain to R  shoulder and R knee.  Patient performs 5 mins on nu-step x 5 mins Standing hip abd x 10 BLE with increased pain and poor tolerance Supine SAQ x 10 with increased pain Supine L hip abd x 10 E-stim toRshoulder musculature IFC  Crossed pattern x 15 mins at intensity of 9. 5 , 4000 HX 80/150 Hz wavefrom CV/CV  Patient recommended to [facility] for [facility] shoulder and right knee pain that is at a high intensity Needs assist for correct positioning for LE and UE.                       PT Short Term Goals - 08/17/18 0938      PT SHORT TERM GOAL #1   Title  Patient will report a worst pain of 3/10 on VAS in  right UE and right LE and back to improve tolerance with ADLs and reduced symptoms with activities.     Time  4    Period  Weeks    Status  New    Target Date  09/14/18      PT SHORT TERM GOAL #2   Title  Patient will be able to perform household  work/ chores without increase in symptoms.    Time  4    Period  Weeks    Status  New    Target Date  09/14/18        PT Long Term Goals - 08/17/18 0951      PT LONG TERM GOAL #1   Title  Patient will be independent in home exercise program to improve strength/mobility for better functional independence with ADLs    Time  8    Period  Weeks    Status  New    Target Date  10/12/18      PT LONG TERM GOAL #2   Title  Patient (< 41 years old) will complete five times sit to stand test in < 10 seconds indicating an increased LE strength and improved balance.    Time  8    Period  Weeks    Status  New    Target Date  10/12/18      PT LONG TERM GOAL #3   Title  Patient will increase six minute walk test distance to >1000 for progression to community ambulator and improve gait ability    Time  8    Period  Weeks    Status  New    Target Date  10/12/18      PT LONG TERM GOAL #4   Title  Patient will reduce timed up and go to <11 seconds to reduce fall risk and demonstrate improved transfer/gait ability.     Time  8    Period  Weeks    Status  New    Target Date  10/12/18            Plan - 08/24/18 1062    Clinical Impression Statement  Patient has high pain today in right shoulder and right knee 7/10. Therapeutic exercises were attempted with inability to tolerate therapy  in standing or sitting with increased pain.  Patient tolerated e-stim to right shoulder to reduce pain and positioning ;discussed positioning to reduce shoulder and knee pain. Patient will continue to benefit from skilled PT to improve mobility and strength.     Rehab Potential  Good    PT Frequency  2x / week    PT Duration  8 weeks    PT Treatment/Interventions  Aquatic Therapy;Electrical Stimulation;Cryotherapy;Moist Heat;Gait training;Functional mobility training;Therapeutic exercise;Therapeutic activities;Balance training;Patient/family education;Neuromuscular re-education;Passive range of motion;Dry needling    PT Next Visit Plan  mobility training, gait training    PT Home Exercise Plan  R Hooklying SLR, L sidelying R clams, overhead pulleys for R shoulder AAROM    Consulted and Agree with Plan of Care  Patient       Patient will benefit from skilled therapeutic intervention in order to improve the following deficits and impairments:  Difficulty walking, Decreased mobility, Pain, Postural dysfunction, Impaired UE functional use, Impaired flexibility, Decreased strength, Decreased coordination, Decreased activity tolerance, Obesity, Abnormal gait, Decreased balance  Visit Diagnosis: Muscle weakness (generalized)  Difficulty in walking, not elsewhere classified  Other abnormalities of gait and mobility     Problem List Patient Active Problem List   Diagnosis Date Noted  . CVA (cerebral vascular accident) ([place]) 07/11/2018  . Thrombocytosis ([place]) 05/30/2018  . Lymphedema of both lower extremities 05/30/2018  . Moderate episode of recurrent major depressive disorder ([place]) 05/30/2018  . Pure  hypercholesterolemia 05/30/2018  . Menorrhagia with regular cycle 10/12/2017  . Morbid obesity ([place]) 01/27/2017  . History of vitamin D deficiency 01/27/2017  .  History of hypercholesterolemia 01/27/2017  . Fibroids 01/27/2017  . Asthma, well controlled 01/15/2014    Alanson Puls, [place] DPT 08/24/2018, 8:42 AM  [place] MAIN [facility] SERVICES [address] [place], [place], [address] Phone: [phone]   Fax:  [phone]  Name: [name] MRN: [id number] Date of Birth: 02-24-1971

## 2018-08-29 ENCOUNTER — Ambulatory Visit: Payer: BLUE CROSS/BLUE SHIELD

## 2018-08-29 ENCOUNTER — Telehealth: Payer: Self-pay | Admitting: Family Medicine

## 2018-08-29 NOTE — Telephone Encounter (Signed)
Patient states [name] needs to know patient is still out of work to be able to get paid. Please advise.

## 2018-08-29 NOTE — Telephone Encounter (Signed)
Copied from [place] [phone]. Topic: General - Other >> Aug 29, 2018 11:23 AM Carolyn Stare wrote:  Pt would like a call back concerning her FMLA paper work   [phone]

## 2018-08-29 NOTE — Telephone Encounter (Signed)
Can we just send a written excuse? Otherwise patient will have to come in sooner to fill out FMLA

## 2018-08-30 NOTE — Telephone Encounter (Signed)
Patient called requesting to speak with Tiffany. Patient states that she is needing a statement saying that her condition is still the same and she is under the care of Dr. Ancil Boozer. Patient has an appointment on 01/28 and can fill out paperwork then.

## 2018-08-31 ENCOUNTER — Encounter: Payer: BLUE CROSS/BLUE SHIELD | Admitting: Occupational Therapy

## 2018-08-31 ENCOUNTER — Ambulatory Visit: Payer: BLUE CROSS/BLUE SHIELD | Admitting: Physical Therapy

## 2018-09-01 ENCOUNTER — Encounter: Payer: Self-pay | Admitting: Physical Therapy

## 2018-09-01 ENCOUNTER — Ambulatory Visit: Payer: BLUE CROSS/BLUE SHIELD | Admitting: Occupational Therapy

## 2018-09-06 ENCOUNTER — Emergency Department
Admission: EM | Admit: 2018-09-06 | Discharge: 2018-09-06 | Disposition: A | Discharge status: Home / Self Care | Payer: BLUE CROSS/BLUE SHIELD | Attending: Emergency Medicine | Admitting: Emergency Medicine

## 2018-09-06 ENCOUNTER — Ambulatory Visit: Payer: BLUE CROSS/BLUE SHIELD | Admitting: Physical Therapy

## 2018-09-06 ENCOUNTER — Encounter: Payer: Self-pay | Admitting: Emergency Medicine

## 2018-09-06 ENCOUNTER — Other Ambulatory Visit: Payer: Self-pay

## 2018-09-06 DIAGNOSIS — Z79899 Other long term (current) drug therapy: Secondary | ICD-10-CM | POA: Diagnosis not present

## 2018-09-06 DIAGNOSIS — L03211 Cellulitis of face: Secondary | ICD-10-CM | POA: Diagnosis not present

## 2018-09-06 DIAGNOSIS — I69351 Hemiplegia and hemiparesis following cerebral infarction affecting right dominant side: Secondary | ICD-10-CM | POA: Insufficient documentation

## 2018-09-06 DIAGNOSIS — E119 Type 2 diabetes mellitus without complications: Secondary | ICD-10-CM | POA: Insufficient documentation

## 2018-09-06 DIAGNOSIS — R220 Localized swelling, mass and lump, head: Secondary | ICD-10-CM | POA: Diagnosis present

## 2018-09-06 DIAGNOSIS — Z7982 Long term (current) use of aspirin: Secondary | ICD-10-CM | POA: Diagnosis not present

## 2018-09-06 DIAGNOSIS — J45909 Unspecified asthma, uncomplicated: Secondary | ICD-10-CM | POA: Diagnosis not present

## 2018-09-06 LAB — COMPREHENSIVE METABOLIC PANEL
ALT: 20 U/L (ref 0–44)
AST: 21 U/L (ref 15–41)
Albumin: 3.3 g/dL — ABNORMAL LOW (ref 3.5–5.0)
Alkaline Phosphatase: 85 U/L (ref 38–126)
Anion gap: 7 (ref 5–15)
BILIRUBIN TOTAL: 0.4 mg/dL (ref 0.3–1.2)
BUN: 14 mg/dL (ref 6–20)
CO2: 27 mmol/L (ref 22–32)
Calcium: 8.3 mg/dL — ABNORMAL LOW (ref 8.9–10.3)
Chloride: 106 mmol/L (ref 98–111)
Creatinine, Ser: 0.78 mg/dL (ref 0.44–1.00)
GFR calc non Af Amer: >60 mL/min (ref >60)
Glucose, Bld: 107 mg/dL — ABNORMAL HIGH (ref 70–99)
Potassium: 3.4 mmol/L — ABNORMAL LOW (ref 3.5–5.1)
Sodium: 140 mmol/L (ref 135–145)
TOTAL PROTEIN: 7.2 g/dL (ref 6.5–8.1)

## 2018-09-06 LAB — CBC WITH DIFFERENTIAL/PLATELET
Abs Immature Granulocytes: 0.07 10*3/uL (ref 0.00–0.07)
BASOS PCT: 1 %
Basophils Absolute: 0.1 10*3/uL (ref 0.0–0.1)
Eosinophils Absolute: 0.9 10*3/uL — ABNORMAL HIGH (ref 0.0–0.5)
Eosinophils Relative: 5 %
HCT: 40.4 % (ref 36.0–46.0)
Hemoglobin: 13.2 g/dL (ref 12.0–15.0)
Immature Granulocytes: 0 %
Lymphocytes Relative: 27 %
Lymphs Abs: 4.6 10*3/uL — ABNORMAL HIGH (ref 0.7–4.0)
MCH: 28.5 pg (ref 26.0–34.0)
MCHC: 32.7 g/dL (ref 30.0–36.0)
MCV: 87.3 fL (ref 80.0–100.0)
MONOS PCT: 5 %
Monocytes Absolute: 0.8 10*3/uL (ref 0.1–1.0)
NEUTROS PCT: 62 %
Neutro Abs: 10.7 10*3/uL — ABNORMAL HIGH (ref 1.7–7.7)
Platelets: 465 10*3/uL — ABNORMAL HIGH (ref 150–400)
RBC: 4.63 MIL/uL (ref 3.87–5.11)
RDW: 14.3 % (ref 11.5–15.5)
WBC: 17.1 10*3/uL — ABNORMAL HIGH (ref 4.0–10.5)
nRBC: 0 % (ref 0.0–0.2)

## 2018-09-06 LAB — LACTIC ACID, PLASMA: LACTIC ACID, VENOUS: 0.9 mmol/L (ref 0.5–1.9)

## 2018-09-06 MED ORDER — ONDANSETRON 4 MG PO TBDP
4.0000 mg | ORAL_TABLET | Freq: Once | ORAL | Status: AC
  Administered 2018-09-06: 4 mg via ORAL
  Filled 2018-09-06: qty 1

## 2018-09-06 MED ORDER — CEPHALEXIN 500 MG PO CAPS: 500.0000 mg | ORAL_CAPSULE | Freq: Two times a day (BID) | ORAL | 0 refills | Status: DC

## 2018-09-06 MED ORDER — CEPHALEXIN 500 MG PO CAPS
500.0000 mg | ORAL_CAPSULE | Freq: Once | ORAL | Status: AC
  Administered 2018-09-06: 500 mg via ORAL
  Filled 2018-09-06: qty 1

## 2018-09-06 MED ORDER — DIPHENHYDRAMINE HCL 25 MG PO CAPS
50.0000 mg | ORAL_CAPSULE | Freq: Once | ORAL | Status: AC
  Administered 2018-09-06: 50 mg via ORAL
  Filled 2018-09-06: qty 2

## 2018-09-06 NOTE — ED Triage Notes (Addendum)
Patient ambulatory to triage with steady gait, without difficulty or distress noted; pt reports ?bite to left cheek noted Monday am; swelling/redness and area of clear drainage noted, firm to touch

## 2018-09-07 ENCOUNTER — Ambulatory Visit (INDEPENDENT_AMBULATORY_CARE_PROVIDER_SITE_OTHER): Payer: BLUE CROSS/BLUE SHIELD | Admitting: Family Medicine

## 2018-09-07 ENCOUNTER — Other Ambulatory Visit: Payer: Self-pay

## 2018-09-07 ENCOUNTER — Emergency Department
Admission: EM | Admit: 2018-09-07 | Discharge: 2018-09-07 | Disposition: A | Discharge status: Home / Self Care | Payer: BLUE CROSS/BLUE SHIELD | Attending: Emergency Medicine | Admitting: Emergency Medicine

## 2018-09-07 ENCOUNTER — Encounter: Payer: Self-pay | Admitting: Emergency Medicine

## 2018-09-07 ENCOUNTER — Encounter: Payer: Self-pay | Admitting: Family Medicine

## 2018-09-07 VITALS — BP 130/76 | HR 99 | Temp 97.5°F | Resp 16 | Ht 59.0 in | Wt 309.5 lb

## 2018-09-07 DIAGNOSIS — Z7982 Long term (current) use of aspirin: Secondary | ICD-10-CM | POA: Insufficient documentation

## 2018-09-07 DIAGNOSIS — L03211 Cellulitis of face: Secondary | ICD-10-CM

## 2018-09-07 DIAGNOSIS — I69351 Hemiplegia and hemiparesis following cerebral infarction affecting right dominant side: Secondary | ICD-10-CM | POA: Diagnosis not present

## 2018-09-07 DIAGNOSIS — J45909 Unspecified asthma, uncomplicated: Secondary | ICD-10-CM | POA: Diagnosis not present

## 2018-09-07 DIAGNOSIS — F331 Major depressive disorder, recurrent, moderate: Secondary | ICD-10-CM | POA: Diagnosis not present

## 2018-09-07 DIAGNOSIS — Z794 Long term (current) use of insulin: Secondary | ICD-10-CM | POA: Insufficient documentation

## 2018-09-07 DIAGNOSIS — M25511 Pain in right shoulder: Secondary | ICD-10-CM

## 2018-09-07 DIAGNOSIS — Z79899 Other long term (current) drug therapy: Secondary | ICD-10-CM | POA: Insufficient documentation

## 2018-09-07 DIAGNOSIS — R600 Localized edema: Secondary | ICD-10-CM | POA: Diagnosis present

## 2018-09-07 DIAGNOSIS — R2689 Other abnormalities of gait and mobility: Secondary | ICD-10-CM | POA: Insufficient documentation

## 2018-09-07 LAB — CBC WITH DIFFERENTIAL/PLATELET
Abs Immature Granulocytes: 0.04 10*3/uL (ref 0.00–0.07)
Basophils Absolute: 0.1 10*3/uL (ref 0.0–0.1)
Basophils Relative: 1 %
Eosinophils Absolute: 0.8 10*3/uL — ABNORMAL HIGH (ref 0.0–0.5)
Eosinophils Relative: 5 %
HCT: 41.3 % (ref 36.0–46.0)
HEMOGLOBIN: 13.2 g/dL (ref 12.0–15.0)
Immature Granulocytes: 0 %
Lymphocytes Relative: 25 %
Lymphs Abs: 3.7 10*3/uL (ref 0.7–4.0)
MCH: 28.4 pg (ref 26.0–34.0)
MCHC: 32 g/dL (ref 30.0–36.0)
MCV: 88.8 fL (ref 80.0–100.0)
MONOS PCT: 4 %
Monocytes Absolute: 0.6 10*3/uL (ref 0.1–1.0)
Neutro Abs: 9.5 10*3/uL — ABNORMAL HIGH (ref 1.7–7.7)
Neutrophils Relative %: 65 %
Platelets: 466 10*3/uL — ABNORMAL HIGH (ref 150–400)
RBC: 4.65 MIL/uL (ref 3.87–5.11)
RDW: 14.3 % (ref 11.5–15.5)
WBC: 14.7 10*3/uL — ABNORMAL HIGH (ref 4.0–10.5)
nRBC: 0 % (ref 0.0–0.2)

## 2018-09-07 LAB — COMPREHENSIVE METABOLIC PANEL
ALT: 17 U/L (ref 0–44)
AST: 18 U/L (ref 15–41)
Albumin: 3.4 g/dL — ABNORMAL LOW (ref 3.5–5.0)
Alkaline Phosphatase: 87 U/L (ref 38–126)
Anion gap: 7 (ref 5–15)
BUN: 10 mg/dL (ref 6–20)
CO2: 29 mmol/L (ref 22–32)
Calcium: 8.7 mg/dL — ABNORMAL LOW (ref 8.9–10.3)
Chloride: 105 mmol/L (ref 98–111)
Creatinine, Ser: 0.84 mg/dL (ref 0.44–1.00)
GFR calc Af Amer: >60 mL/min (ref >60)
GFR calc non Af Amer: >60 mL/min (ref >60)
Glucose, Bld: 99 mg/dL (ref 70–99)
Potassium: 3.6 mmol/L (ref 3.5–5.1)
Sodium: 141 mmol/L (ref 135–145)
Total Bilirubin: 0.4 mg/dL (ref 0.3–1.2)
Total Protein: 7.3 g/dL (ref 6.5–8.1)

## 2018-09-07 MED ORDER — CLINDAMYCIN HCL 300 MG PO CAPS: 300.0000 mg | ORAL_CAPSULE | Freq: Three times a day (TID) | ORAL | 0 refills | Status: AC

## 2018-09-07 MED ORDER — DULOXETINE HCL 30 MG PO CSDR: 1.0000 | DELAYED_RELEASE_CAPSULE | Freq: Every day | ORAL | 0 refills | Status: DC

## 2018-09-07 MED ORDER — LIDOCAINE HCL (PF) 1 % IJ SOLN
30.0000 mL | Freq: Once | INTRAMUSCULAR | Status: AC
  Administered 2018-09-07: 30 mL
  Filled 2018-09-07: qty 30

## 2018-09-07 MED ORDER — CLINDAMYCIN PHOSPHATE 600 MG/50ML IV SOLN
600.0000 mg | Freq: Once | INTRAVENOUS | Status: AC
  Administered 2018-09-07: 600 mg via INTRAVENOUS
  Filled 2018-09-07: qty 50

## 2018-09-07 NOTE — ED Notes (Signed)
Dr. Burlene Arnt unable to obtain culture from wound. No drainage noted to site when Dr. Burlene Arnt attempted to drain area.

## 2018-09-07 NOTE — Progress Notes (Signed)
Name: [name]   MRN: [id number]    DOB: [birth year]   Date:09/07/2018       Progress Note  Subjective  Chief Complaint  Chief Complaint  Patient presents with  . Follow-up    1 month F/U  . Hemiparesis    HPI  CVA with right side hemiparesis: stroke happened on 07/12/2018, she had 3 session of PT since, she will see neurologist today, she is on Atorvastatin and aspirin and bp has been at goal. She continues to have decrease in strength on right arm and right leg weakness. She has not been back to work and needs paperwork filled out, she is still unable to lift more than 10 lbs, she has difficulty climbing stairs, standing for a long time . Explained that she may be able to return to work March 8 th, 2010 possibly with restrictions.   Moderate Depression: she is feeling down since the stroke, she has a long history of depression however getting worse with all the recent medical problems. She is willing to start mediation she denies suicidal thoughts or ideation. Phq9 is up to 19 today  Cellulitis face: she states she was seen at urgent care 9 days ago for a bump on her nuchal area, she thought it was a spider bite but was diagnosed with abscess and given Omnicef ( which she has finished ), she states that has resolved, however she noticed a red spot on left cheek about 4 days ago, that got worse so she went to [facility] and was given Keflex, the area is getting larger, more painful, oozing white material and today she has noticed that left vision is blurred, fever subsided in the past day, no chills, but everything else is getting worse Advised her to go to [facility] today for possible CT and IV antiobiotics  Fall: she states she chocked on Dec 30 th and as she was trying to breath and run to the trash can she fell, possible syncope, hit her right shoulder and also since that day having right leg pain, harder to walk. She has seen Ortho and diagnosed with sprain and fracture of right AC joint.  Since she did not seek medical care immediately she is only on pain medication and rest.   Obesity: she has gained weight since out of work and more stressed. Discussed life style modification   Patient Active Problem List   Diagnosis Date Noted  . Hemiparesis affecting right side as late effect of cerebrovascular accident (CVA) ([place]) 09/07/2018  . Long term (current) use of insulin ([place]) 09/07/2018  . Thrombocytosis ([place]) 05/30/2018  . Lymphedema of both lower extremities 05/30/2018  . Moderate episode of recurrent major depressive disorder ([place]) 05/30/2018  . Pure hypercholesterolemia 05/30/2018  . Menorrhagia with regular cycle 10/12/2017  . Morbid obesity ([place]) 01/27/2017  . History of vitamin D deficiency 01/27/2017  . History of hypercholesterolemia 01/27/2017  . Fibroids 01/27/2017  . Asthma, well controlled 01/15/2014    Past Surgical History:  Procedure Laterality Date  . CESAREAN SECTION  1989  . COLONOSCOPY    . FOOT SURGERY Left 2013  . HERNIA REPAIR      Family History  Problem Relation Age of Onset  . Diabetes Mother   . Uterine cancer Mother   . Colon cancer Mother   . Cancer - Lung Father   . Colon cancer Father   . Diverticulitis Brother   . Hernia Brother        x2  surgeries  . Colon cancer Paternal Grandmother   . Lung cancer Paternal Grandmother     Social History   Socioeconomic History  . Marital status: Married    Spouse name: Stage manager  . Number of children: 1  . Years of education: Not on file  . Highest education level: Some college, no degree  Occupational History  . Occupation: customer services   Social Needs  . Financial resource strain: Not hard at all  . Food insecurity:    Worry: Never true    Inability: Never true  . Transportation needs:    Medical: No    Non-medical: No  Tobacco Use  . Smoking status: Never Smoker  . Smokeless tobacco: Never Used  Substance and Sexual Activity  . Alcohol use: Yes    Comment:  occasionally  . Drug use: No  . Sexual activity: Yes    Partners: Male    Birth control/protection: Pill  Lifestyle  . Physical activity:    Days per week: 5 days    Minutes per session: 30 min  . Stress: Only a little  Relationships  . Social connections:    Talks on phone: More than three times a week    Gets together: Twice a week    Attends religious service: More than 4 times per year    Active member of club or organization: No    Attends meetings of clubs or organizations: Never    Relationship status: Married  . Intimate partner violence:    Fear of current or ex partner: No    Emotionally abused: No    Physically abused: No    Forced sexual activity: No  Other Topics Concern  . Not on file  Social History Narrative   Married with her high school sweet heart , they had first child at age 51, they recently got married ( on their daughter's birthday 2019), she two miscarriages     Current Outpatient Medications:  .  Albuterol Sulfate (VENTOLIN HFA IN), Inhale into the lungs as needed (for shortness of breath). , Disp: , Rfl:  .  aspirin 81 MG chewable tablet, Chew 1 tablet (81 mg total) by mouth daily., Disp: 30 tablet, Rfl: 0 .  atorvastatin (LIPITOR) 40 MG tablet, Take 1 tablet by mouth daily., Disp: , Rfl:  .  azithromycin (ZITHROMAX) 250 MG tablet, TAKE ONE TABLET BY MOUTH ON MONDAYS WEDNESDAYS AND FRIDAYS., Disp: , Rfl:  .  cephALEXin (KEFLEX) 500 MG capsule, Take 1 capsule (500 mg total) by mouth 2 (two) times daily for 10 days., Disp: 20 capsule, Rfl: 0 .  Cinnamon 500 MG TABS, Take 2 tablets by mouth daily., Disp: , Rfl:  .  mometasone-formoterol (DULERA) 200-5 MCG/ACT AERO, Inhale 2 puffs into the lungs 2 (two) times daily., Disp: , Rfl:  .  montelukast (SINGULAIR) 10 MG tablet, Take 1 tablet by mouth daily., Disp: , Rfl:  .  Multiple Vitamins-Calcium (ONE-A-DAY WITHIN PO), Take 1 tablet by mouth daily., Disp: , Rfl:  .  Omega 3 1000 MG CAPS, Take 1 capsule by  mouth daily., Disp: , Rfl:  .  Tiotropium Bromide Monohydrate (SPIRIVA RESPIMAT) 1.25 MCG/ACT AERS, Inhale into the lungs., Disp: , Rfl:  .  traMADol (ULTRAM) 50 MG tablet, Take 50 mg by mouth every 8 (eight) hours as needed. for pain, Disp: , Rfl:  .  triamcinolone cream (KENALOG) 0.1 %, Apply topically., Disp: , Rfl:  .  vitamin B-12 (CYANOCOBALAMIN) 1000 MCG tablet, Take  1,000 mcg by mouth daily., Disp: , Rfl:  .  Vitamin D, Ergocalciferol, (DRISDOL) 1.25 MG (50000 UT) CAPS capsule, Take 1 capsule (50,000 Units total) by mouth every 7 (seven) days. For 3 months., Disp: 4 capsule, Rfl: 2 .  DULoxetine HCl 30 MG CSDR, Take 1-2 capsules by mouth daily. First week take one after that take two daily, Disp: 60 capsule, Rfl: 0  Allergies  Allergen Reactions  . Advil [Ibuprofen] Other (See Comments)    Other Reaction: Other reaction-swells  . Percocet [Oxycodone-Acetaminophen] Nausea And Vomiting and Other (See Comments)    headaches    I personally reviewed active problem list, medication list, allergies, family history, social history with the patient/caregiver today.   ROS  Constitutional: Negative for fever ( resolved yesterday)  or weight change.  Respiratory: Negative for cough and shortness of breath.   Cardiovascular: Negative for chest pain or palpitations.  Gastrointestinal: Negative for abdominal pain, no bowel changes.  Musculoskeletal: Negative for gait problem or joint swelling.  Skin: cellulitis face  Neurological: Negative for dizziness or headache.  No other specific complaints in a complete review of systems (except as listed in HPI above).  Objective  Vitals:   09/07/18 1121  BP: 130/76  Pulse: 99  Resp: 16  Temp: (!) 97.5 F (36.4 C)  TempSrc: Oral  SpO2: 99%  Weight: (!) 309 lb 8 oz (140.4 kg)  Height: 4' 11"  (1.499 m)    Body mass index is 62.51 kg/m.  Physical Exam  Constitutional: Patient appears well-developed and well-nourished. Obese  No  distress.  HEENT: head atraumatic, normocephalic, pupils equal and reactive to light,  neck supple, throat within normal limits, left side of face is swollen, tender, red and oozing yellow material.  Cardiovascular: Normal rate, regular rhythm and normal heart sounds.  No murmur heard. No BLE edema. Pulmonary/Chest: Effort normal and breath sounds normal. No respiratory distress. Muscular Skeletal: walking slowly, favoring left side, she states in pain from recent fall, but also weak from stroke, grip 5/5 but weaker on right than left  Abdominal: Soft.  There is no tenderness. Psychiatric: Patient has a normal mood and affect. behavior is normal. Judgment and thought content normal.  Recent Results (from the past 2160 hour(s))  Flow cytometry panel-leukemia/lymphoma work-up     Status: None   Collection Time: 07/01/18 10:49 AM  Result Value Ref Range   PATH INTERP XXX-IMP Comment     Comment: (NOTE) Eosinophilia. No other significant diagnostic immunophenotypic abnormality detected. (See comment.)    ANNOTATION COMMENT IMP Comment     Comment: (NOTE) Eosinophilia can be associated with drug reaction, parasitic infection, and neoplastic processes, among others. Clinical correlation is necessary.    CLINICAL INFO Comment     Comment: (NOTE) Accompanying CBC dated 07/01/18 shows:  WBC count 10.7, Neu 6.0, Lym 3.0, Mon 0.5, Eos 1.1, Eos% 10.    Specimen Type Comment     Comment: Peripheral blood   ASSESSMENT OF LEUKOCYTES Comment     Comment: (NOTE) No monoclonal B cell population is detected. kappa:lambda ratio 2.3 There is no loss of, or aberrant expression of, the pan T cell antigens to suggest a neoplastic T cell process. CD4:CD8 ratio 5.0 No circulating blasts are detected. There is no immunophenotypic evidence of abnormal myeloid maturation. Eosinophils are increased and account for approximately 10% of leukocytes. Analysis of the leukocyte population shows: Neutophils  62%, eosinophils 10%, monocytes 3%, lymphocytes 25%, blasts <0.5%, B cells 3%, T cells 20%, NK  cells 2%.    % Viable Cells Comment     Comment: 90%   ANALYSIS AND GATING STRATEGY Comment     Comment: 8 color analysis with CD45/SSC gating   IMMUNOPHENOTYPING STUDY Comment     Comment: (NOTE) CD2       Normal         CD3       Normal CD4       Normal         CD5       Normal CD7       Normal         CD8       Normal CD10      Normal         CD11b     Normal CD13      Normal         CD14      Normal CD16      Normal         CD19      Normal CD20      Normal         CD33      Normal CD34      Normal         CD38      Normal CD45      Normal         CD56      Normal CD57      Normal         CD117     Normal HLA-DR    Normal         KAPPA     Normal LAMBDA    Normal         CD64      Normal    PATHOLOGIST NAME Comment     Comment: Cecilie Kicks, M. D.   COMMENT: Comment     Comment: (NOTE) Each antibody in this assay was utilized to assess for potential abnormalities of studied cell populations or to characterize identified abnormalities. This test was developed and its performance characteristics determined by [facility].  It has not been cleared or approved by the U.S. Food and Drug Administration. The FDA has determined that such clearance or approval is not necessary. This test is used for clinical purposes.  It should not be regarded as investigational or for research. Performed At: -[facility] [address] [place], [place] [id number] Nechama Guard MD [id number] Performed At: [facility] [address] [place], [place] [id number] Nechama Guard MD [id number]:[id number]   BCR-ABL1 FISH     Status: None   Collection Time: 07/01/18 10:49 AM  Result Value Ref Range   Specimen Type BLOOD    Cells Counted 200    Cells Analyzed 200    FISH Result Comment:     Comment: NORMAL:  NO BCR OR ABL1 GENE REARRANGEMENT OBSERVED   Interpretation Comment:      Comment: (NOTE)             nuc ish 9q34(ASS1,ABL1)x2,22q11.2(BCRx2)[200].      The fluorescence in situ hybridization (FISH) study was normal. FISH, using unique sequence DNA probes for the ABL1 and BCR gene regions showed two ABL1 signals (red), two control ASS1 gene signals (aqua) located adjacent to the ABL1 locus at 9q34, and two BCR signals (green) at 22q11.2 in all interphase nuclei examined. There was NO evidence of CML or ALL-associated BCR/ABL1 dual fusion signals in  this analysis. .      This analysis is limited to abnormalities detectable by the specific probes included in the study. FISH results should be interpreted within the context of a full cytogenetic analysis and pathology evaluation. .      This test was developed and its performance characteristics determined by [facility]). It has not been cleared or approved by the U.S. Food and Drug Administration. A BCR-ABL1 gene fusion in greater than 3 interphase nuclei in a  patient with a new clinical diagnosis is considered positive. The DNA probe vendor for this study was [employer], research (physical sciences)).    Director Review: Comment:     Comment: (NOTE) Kirby Crigler, PHD, [place] Performed At: [facility] [place], [place] [id number] Nechama Guard MD [id number] Performed At: [facility] [address] [place], [place] [id number] Rush Farmer MD GQ:[id number]   CBC with Differential/Platelet     Status: Abnormal   Collection Time: 07/01/18 10:49 AM  Result Value Ref Range   WBC 10.7 (H) 4.0 - 10.5 K/uL   RBC 4.35 3.87 - 5.11 MIL/uL   Hemoglobin 12.4 12.0 - 15.0 g/dL   HCT 38.1 36.0 - 46.0 %   MCV 87.6 80.0 - 100.0 fL   MCH 28.5 26.0 - 34.0 pg   MCHC 32.5 30.0 - 36.0 g/dL   RDW 13.7 11.5 - 15.5 %   Platelets 340 150 - 400 K/uL   nRBC 0.0 0.0 - 0.2 %    Comment: Performed at [facility], [place], [place] [address]   Neutrophils Relative % ORDER MODIFIED OR RESCHEDULED %   Neutro Abs ORDER MODIFIED OR RESCHEDULED 1.7 - 7.7 K/uL   Band Neutrophils ORDER MODIFIED OR RESCHEDULED %   Lymphocytes Relative ORDER MODIFIED OR RESCHEDULED %   Lymphs Abs ORDER MODIFIED OR RESCHEDULED 0.7 - 4.0 K/uL   Monocytes Relative ORDER MODIFIED OR RESCHEDULED %   Monocytes Absolute ORDER MODIFIED OR RESCHEDULED 0.1 - 1.0 K/uL   Eosinophils Relative ORDER MODIFIED OR RESCHEDULED %   Eosinophils Absolute ORDER MODIFIED OR RESCHEDULED 0.0 - 0.5 K/uL   Basophils Relative ORDER MODIFIED OR RESCHEDULED %   Basophils Absolute ORDER MODIFIED OR RESCHEDULED 0.0 - 0.1 K/uL   WBC Morphology ORDER MODIFIED OR RESCHEDULED    RBC Morphology ORDER MODIFIED OR RESCHEDULED    Smear Review ORDER MODIFIED OR RESCHEDULED    Other ORDER MODIFIED OR RESCHEDULED %   nRBC ORDER MODIFIED OR RESCHEDULED 0 /100 WBC   Metamyelocytes Relative ORDER MODIFIED OR RESCHEDULED %   Myelocytes ORDER MODIFIED OR RESCHEDULED %   Promyelocytes Relative ORDER MODIFIED OR RESCHEDULED %   Blasts ORDER MODIFIED OR RESCHEDULED %  Technologist smear review     Status: None   Collection Time: 07/01/18 10:49 AM  Result Value Ref Range   Tech Review RBC MORPHOLOGY NORMAL     Comment: Normal platelet morphology PLATELETS APPEAR ADEQUATE Reviewed by Janae Bridgeman L. Golden Circle, M.D. [facility] MORPHOLOGY UNREMARKABLE Performed at [facility], [address] [place], [place] [address]   Differential     Status: Abnormal   Collection Time: 07/01/18 11:36 AM  Result Value Ref Range   Neutrophils Relative % 56 %   Neutro Abs 6.0 1.7 - 7.7 K/uL   Lymphocytes Relative 28 %   Lymphs Abs 3.0 0.7 - 4.0 K/uL   Monocytes Relative 5 %   Monocytes Absolute 0.5 0.1 - 1.0 K/uL  Eosinophils Relative 10 %   Eosinophils Absolute 1.1 (H) 0.0 - 0.5 K/uL   Basophils Relative 1 %   Basophils Absolute 0.1 0.0 - 0.1 K/uL   Immature Granulocytes 0 %   Abs Immature  Granulocytes 0.04 0.00 - 0.07 K/uL    Comment: Performed at [facility], [address] [place], [place] [address]  Glucose, capillary     Status: None   Collection Time: 07/11/18  1:05 PM  Result Value Ref Range   Glucose-Capillary 87 70 - 99 mg/dL  MRSA PCR Screening     Status: None   Collection Time: 07/11/18  5:20 PM  Result Value Ref Range   MRSA by PCR NEGATIVE NEGATIVE    Comment:        The GeneXpert MRSA Assay (FDA approved for NASAL specimens only), is one component of a comprehensive MRSA colonization surveillance program. It is not intended to diagnose MRSA infection nor to guide or monitor treatment for MRSA infections. Performed at [facility], [address] [place], [place] [address]   ECHOCARDIOGRAM COMPLETE     Status: None   Collection Time: 07/12/18 12:30 PM  Result Value Ref Range   Weight 4,800 oz   Height 59 in   BP 100/63 mmHg  Ethanol     Status: None   Collection Time: 07/12/18  8:57 PM  Result Value Ref Range   Alcohol, Ethyl (B) <10 <10 mg/dL    Comment: (NOTE) Lowest detectable limit for serum alcohol is 10 mg/dL. For medical purposes only. Performed at [facility], [address] [place], [place] [address]   Protime-INR     Status: None   Collection Time: 07/12/18  8:57 PM  Result Value Ref Range   Prothrombin Time 14.5 11.4 - 15.2 seconds   INR 1.14     Comment: Performed at [facility], [address] [place], [place] [address]  APTT     Status: None   Collection Time: 07/12/18  8:57 PM  Result Value Ref Range   aPTT 31 24 - 36 seconds    Comment: Performed at [facility], [address] [place], [place] [address]  CBC     Status: Abnormal   Collection Time: 07/12/18  8:57 PM  Result Value Ref Range   WBC 13.0 (H) 4.0 - 10.5 K/uL   RBC 4.42 3.87 - 5.11 MIL/uL   Hemoglobin 12.8 12.0 - 15.0 g/dL   HCT 39.3 36.0 - 46.0 %   MCV 88.9 80.0 - 100.0 fL   MCH 29.0 26.0 - 34.0 pg    MCHC 32.6 30.0 - 36.0 g/dL   RDW 14.1 11.5 - 15.5 %   Platelets 441 (H) 150 - 400 K/uL   nRBC 0.0 0.0 - 0.2 %    Comment: Performed at [facility], [address] [place],  [address]  Differential     Status: Abnormal   Collection Time: 07/12/18  8:57 PM  Result Value Ref Range   Neutrophils Relative % 59 %   Neutro Abs 7.8 (H) 1.7 - 7.7 K/uL   Lymphocytes Relative 27 %   Lymphs Abs 3.5 0.7 - 4.0 K/uL   Monocytes Relative 5 %   Monocytes Absolute 0.7 0.1 - 1.0 K/uL   Eosinophils Relative 7 %   Eosinophils Absolute 0.9 (H) 0.0 - 0.5 K/uL   Basophils Relative 1 %   Basophils Absolute 0.1 0.0 - 0.1 K/uL   Immature Granulocytes 1 %  Abs Immature Granulocytes 0.07 0.00 - 0.07 K/uL    Comment: Performed at [facility], [address] [place], [place] [address]  Comprehensive metabolic panel     Status: Abnormal   Collection Time: 07/12/18  8:57 PM  Result Value Ref Range   Sodium 137 135 - 145 mmol/L   Potassium 4.1 3.5 - 5.1 mmol/L   Chloride 107 98 - 111 mmol/L   CO2 21 (L) 22 - 32 mmol/L   Glucose, Bld 128 (H) 70 - 99 mg/dL   BUN 17 6 - 20 mg/dL   Creatinine, Ser 0.85 0.44 - 1.00 mg/dL   Calcium 8.6 (L) 8.9 - 10.3 mg/dL   Total Protein 6.5 6.5 - 8.1 g/dL   Albumin 3.3 (L) 3.5 - 5.0 g/dL   AST 19 15 - 41 U/L   ALT 12 0 - 44 U/L   Alkaline Phosphatase 68 38 - 126 U/L   Total Bilirubin 0.3 0.3 - 1.2 mg/dL   GFR calc non Af Amer >60 >60 mL/min   GFR calc Af Amer >60 >60 mL/min   Anion gap 9 5 - 15    Comment: Performed at [facility], [address] [place], [place] [address]  Troponin I - ONCE - STAT     Status: None   Collection Time: 07/12/18  8:57 PM  Result Value Ref Range   Troponin I <0.03 <0.03 ng/mL    Comment: Performed at [facility], [address] [place], [place] [address]  HIV antibody (Routine Testing)     Status: None   Collection Time: 07/12/18  8:57 PM  Result Value Ref Range   HIV Screen 4th  Generation wRfx Non Reactive Non Reactive    Comment: (NOTE) Performed At: [facility] [place], [place] [id number] Rush Farmer MD OT:[id number]   Triglycerides     Status: None   Collection Time: 07/12/18  8:57 PM  Result Value Ref Range   Triglycerides 82 <150 mg/dL    Comment: Performed at [facility], [address] [place], [place] [address]  Urine Drug Screen, Qualitative     Status: None   Collection Time: 07/13/18  3:53 AM  Result Value Ref Range   Tricyclic, Ur Screen NONE DETECTED NONE DETECTED   Amphetamines, Ur Screen NONE DETECTED NONE DETECTED   MDMA (Ecstasy)Ur Screen NONE DETECTED NONE DETECTED   Cocaine Metabolite,Ur [place] NONE DETECTED NONE DETECTED   Opiate, Ur Screen NONE DETECTED NONE DETECTED   Phencyclidine (PCP) Ur S NONE DETECTED NONE DETECTED   Cannabinoid 50 Ng, Ur [place] NONE DETECTED NONE DETECTED   Barbiturates, Ur Screen NONE DETECTED NONE DETECTED   Benzodiazepine, Ur Scrn NONE DETECTED NONE DETECTED   Methadone Scn, Ur NONE DETECTED NONE DETECTED    Comment: (NOTE) Tricyclics + metabolites, urine    Cutoff 1000 ng/mL Amphetamines + metabolites, urine  Cutoff 1000 ng/mL MDMA (Ecstasy), urine              Cutoff 500 ng/mL Cocaine Metabolite, urine          Cutoff 300 ng/mL Opiate + metabolites, urine        Cutoff 300 ng/mL Phencyclidine (PCP), urine         Cutoff 25 ng/mL Cannabinoid, urine                 Cutoff 50 ng/mL Barbiturates + metabolites, urine  Cutoff 200 ng/mL Benzodiazepine, urine  Cutoff 200 ng/mL Methadone, urine                   Cutoff 300 ng/mL The urine drug screen provides only a preliminary, unconfirmed analytical test result and should not be used for non-medical purposes. Clinical consideration and professional judgment should be applied to any positive drug screen result due to possible interfering substances. A more specific alternate chemical method must be used in order to  obtain a confirmed analytical result. Gas chromatography / mass spectrometry (GC/MS) is the preferred confirmat ory method. Performed at [facility], [address] [place], [place] [address]   Urinalysis, Routine w reflex microscopic     Status: Abnormal   Collection Time: 07/13/18  3:53 AM  Result Value Ref Range   Color, Urine STRAW (A) YELLOW   APPearance CLEAR (A) CLEAR   Specific Gravity, Urine 1.005 1.005 - 1.030   pH 6.0 5.0 - 8.0   Glucose, UA NEGATIVE NEGATIVE mg/dL   Hgb urine dipstick SMALL (A) NEGATIVE   Bilirubin Urine NEGATIVE NEGATIVE   Ketones, ur NEGATIVE NEGATIVE mg/dL   Protein, ur NEGATIVE NEGATIVE mg/dL   Nitrite NEGATIVE NEGATIVE   Leukocytes, UA NEGATIVE NEGATIVE   RBC / HPF 0-5 0 - 5 RBC/hpf   WBC, UA 0-5 0 - 5 WBC/hpf   Bacteria, UA RARE (A) NONE SEEN   Squamous Epithelial / LPF 0-5 0 - 5    Comment: Performed at [facility], [address] [place], [place] [address]  CBC with Differential     Status: Abnormal   Collection Time: 09/06/18  2:08 AM  Result Value Ref Range   WBC 17.1 (H) 4.0 - 10.5 K/uL   RBC 4.63 3.87 - 5.11 MIL/uL   Hemoglobin 13.2 12.0 - 15.0 g/dL   HCT 40.4 36.0 - 46.0 %   MCV 87.3 80.0 - 100.0 fL   MCH 28.5 26.0 - 34.0 pg   MCHC 32.7 30.0 - 36.0 g/dL   RDW 14.3 11.5 - 15.5 %   Platelets 465 (H) 150 - 400 K/uL   nRBC 0.0 0.0 - 0.2 %   Neutrophils Relative % 62 %   Neutro Abs 10.7 (H) 1.7 - 7.7 K/uL   Lymphocytes Relative 27 %   Lymphs Abs 4.6 (H) 0.7 - 4.0 K/uL   Monocytes Relative 5 %   Monocytes Absolute 0.8 0.1 - 1.0 K/uL   Eosinophils Relative 5 %   Eosinophils Absolute 0.9 (H) 0.0 - 0.5 K/uL   Basophils Relative 1 %   Basophils Absolute 0.1 0.0 - 0.1 K/uL   Immature Granulocytes 0 %   Abs Immature Granulocytes 0.07 0.00 - 0.07 K/uL    Comment: Performed at [facility], [address] [place], [place] [address]  Comprehensive metabolic panel     Status: Abnormal   Collection Time:  09/06/18  2:08 AM  Result Value Ref Range   Sodium 140 135 - 145 mmol/L   Potassium 3.4 (L) 3.5 - 5.1 mmol/L   Chloride 106 98 - 111 mmol/L   CO2 27 22 - 32 mmol/L   Glucose, Bld 107 (H) 70 - 99 mg/dL   BUN 14 6 - 20 mg/dL   Creatinine, Ser 0.78 0.44 - 1.00 mg/dL   Calcium 8.3 (L) 8.9 - 10.3 mg/dL   Total Protein 7.2 6.5 - 8.1 g/dL   Albumin 3.3 (L) 3.5 - 5.0 g/dL   AST 21 15 - 41 U/L   ALT 20  0 - 44 U/L   Alkaline Phosphatase 85 38 - 126 U/L   Total Bilirubin 0.4 0.3 - 1.2 mg/dL   GFR calc non Af Amer >60 >60 mL/min   GFR calc Af Amer >60 >60 mL/min   Anion gap 7 5 - 15    Comment: Performed at [facility], [address] [place], [place] [address]  Lactic acid, plasma     Status: None   Collection Time: 09/06/18  2:08 AM  Result Value Ref Range   Lactic Acid, Venous 0.9 0.5 - 1.9 mmol/L    Comment: Performed at [facility], [place], [place] [address]     PHQ2/9: Depression screen [facility] 2/9 09/07/2018 07/18/2018 05/30/2018  Decreased Interest 3 0 3  Down, Depressed, Hopeless 1 0 2  PHQ - 2 Score 4 0 5  Altered sleeping 1 2 2   Tired, decreased energy 1 2 2   Change in appetite 3 0 2  Feeling bad or failure about yourself  2 0 2  Trouble concentrating 2 0 0  Moving slowly or fidgety/restless 2 0 0  Suicidal thoughts 1 0 1  PHQ-9 Score 16 4 14   Difficult doing work/chores Somewhat difficult Not difficult at all Somewhat difficult     Fall Risk: Fall Risk  09/07/2018 07/18/2018 05/30/2018  Falls in the past year? 1 0 No  Comment August 08, 2018 - -  Number falls in past yr: 0 0 -  Injury with Fall? 1 0 -  Comment right fracture clavicle  - -  Risk for fall due to : Other (Comment) - -  Risk for fall due to: Comment syncopy episode - -     Assessment & Plan  1. Cutaneous abscess of face  - Wound culture Advised her to go to [facility] for evaluation, symptoms getting worse , needs IV antibiotics  2. Hemiparesis affecting right side  as late effect of cerebrovascular accident (CVA) ([place])  Going to see neurologist today, filled out forms for work, unable to go back at this time secondary to gait problems, right hand grip weakness and inability to lift.   3. Morbid (severe) obesity due to excess calories [facility])  Discussed with the patient the risk posed by an increased BMI. Discussed importance of portion control, calorie counting and at least 150 minutes of physical activity weekly. Avoid sweet beverages and drink more water. Eat at least 6 servings of fruit and vegetables daily   4. Moderate episode of recurrent major depressive disorder ([place])  She is willing to try medication for depression now.   - DULoxetine HCl 30 MG CSDR; Take 1-2 capsules by mouth daily. First week take one after that take two daily  Dispense: 60 capsule; Refill: 0  5. Acute right shoulder pain    Keep follow up with ortho

## 2018-09-07 NOTE — Discharge Instructions (Addendum)
The antibiotics as prescribed, return to the emergency room for any new or worrisome symptoms including increased spreading, pain when you move your eye, fever, or feeling otherwise unwell.  Watch the swelling and redness carefully, as it may get worse and not better but is my hope that it gets better.  Follow-up with your primary care doctor tomorrow, for a recheck.  Also, follow-up with your primary care for further information about the cultures that she has obtained.

## 2018-09-07 NOTE — ED Provider Notes (Addendum)
[facility] Emergency Department Provider Note  ____________________________________________   I have reviewed the triage vital signs and the nursing notes. Where available I have reviewed prior notes and, if possible and indicated, outside hospital notes.    HISTORY  Chief Complaint Cellulitis    HPI [name] is a 48 y.o. female who presents today complaining of facial cellulitis.  She has a small draining abscess, patient was started on Keflex, she states that the swelling is getting slightly larger.  No ocular pain.  No vision changes, no change with ranging the eyes.  No fever, drainage is still mildly present, her PCP did culture it.  They sent her back because the redness is getting worse.  She has been compliant with the Keflex.  No history of MRSA that she knows about no other complaints    Past Medical History:  Diagnosis Date  . Asthma   . Difficulty breathing   . Family history of breast cancer    BRCA testing letter sent  . Sinus complaint     Patient Active Problem List   Diagnosis Date Noted  . Hemiparesis affecting right side as late effect of cerebrovascular accident (CVA) ([place]) 09/07/2018  . Long term (current) use of insulin ([place]) 09/07/2018  . Thrombocytosis ([place]) 05/30/2018  . Lymphedema of both lower extremities 05/30/2018  . Moderate episode of recurrent major depressive disorder ([place]) 05/30/2018  . Pure hypercholesterolemia 05/30/2018  . Menorrhagia with regular cycle 10/12/2017  . Morbid obesity ([place]) 01/27/2017  . History of vitamin D deficiency 01/27/2017  . History of hypercholesterolemia 01/27/2017  . Fibroids 01/27/2017  . Asthma, well controlled 01/15/2014    Past Surgical History:  Procedure Laterality Date  . CESAREAN SECTION  1989  . COLONOSCOPY    . FOOT SURGERY Left 2013  . HERNIA REPAIR      Prior to Admission medications   Medication Sig Start Date End Date Taking? Authorizing Provider   Albuterol Sulfate (VENTOLIN HFA IN) Inhale into the lungs as needed (for shortness of breath).     [provider]  aspirin 81 MG chewable tablet Chew 1 tablet (81 mg total) by mouth daily. 07/13/18   Gladstone Lighter, MD  atorvastatin (LIPITOR) 40 MG tablet Take 1 tablet by mouth daily.    [provider]  azithromycin (ZITHROMAX) 250 MG tablet TAKE ONE TABLET BY MOUTH ON MONDAYS WEDNESDAYS AND FRIDAYS. 08/24/18   Erby Pian, MD  cephALEXin (KEFLEX) 500 MG capsule Take 1 capsule (500 mg total) by mouth 2 (two) times daily for 10 days. 09/06/18 09/16/18  Gregor Hams, MD  Cinnamon 500 MG TABS Take 2 tablets by mouth daily.    [provider]  DULoxetine HCl 30 MG CSDR Take 1-2 capsules by mouth daily. First week take one after that take two daily 09/07/18   Steele Sizer, MD  mometasone-formoterol [facility]) 200-5 MCG/ACT AERO Inhale 2 puffs into the lungs 2 (two) times daily. 01/14/17   Erby Pian, MD  montelukast (SINGULAIR) 10 MG tablet Take 1 tablet by mouth daily. 04/22/18 04/22/19  Erby Pian, MD  Multiple Vitamins-Calcium (ONE-A-DAY WITHIN PO) Take 1 tablet by mouth daily.    [provider]  Omega 3 1000 MG CAPS Take 1 capsule by mouth daily.    [provider]  Tiotropium Bromide Monohydrate (SPIRIVA RESPIMAT) 1.25 MCG/ACT AERS Inhale into the lungs. 08/24/18 08/24/19  [provider]  traMADol (ULTRAM) 50 MG tablet Take 50 mg  by mouth every 8 (eight) hours as needed. for pain 08/29/18   [provider]  triamcinolone cream (KENALOG) 0.1 % Apply topically. 08/28/18   [provider]  vitamin B-12 (CYANOCOBALAMIN) 1000 MCG tablet Take 1,000 mcg by mouth daily.    [provider]  Vitamin D, Ergocalciferol, (DRISDOL) 1.25 MG (50000 UT) CAPS capsule Take 1 capsule (50,000 Units total) by mouth every 7 (seven) days. For 3 months. 06/20/18   Gae Dry, MD    Allergies Advil [ibuprofen] and  Percocet [oxycodone-acetaminophen]  Family History  Problem Relation Age of Onset  . Diabetes Mother   . Uterine cancer Mother   . Colon cancer Mother   . Cancer - Lung Father   . Colon cancer Father   . Diverticulitis Brother   . Hernia Brother        x2 surgeries  . Colon cancer Paternal Grandmother   . Lung cancer Paternal Grandmother     Social History Social History   Tobacco Use  . Smoking status: Never Smoker  . Smokeless tobacco: Never Used  Substance Use Topics  . Alcohol use: Yes    Comment: occasionally  . Drug use: No    Review of Systems Constitutional: No fever/chills Eyes: No visual changes. ENT: No sore throat. No stiff neck no neck pain Cardiovascular: Denies chest pain. Respiratory: Denies shortness of breath. Gastrointestinal:   no vomiting.  No diarrhea.  No constipation. Genitourinary: Negative for dysuria. Musculoskeletal: Negative lower extremity swelling Skin: + for rash. Neurological: Negative for severe headaches, focal weakness or numbness.   ____________________________________________   PHYSICAL EXAM:  VITAL SIGNS: ED Triage Vitals [09/07/18 1541]  Enc Vitals Group     BP 125/68     Pulse Rate 84     Resp 18     Temp (!) 97.5 F (36.4 C)     Temp Source Oral     SpO2 100 %     Weight (!) 309 lb 8 oz (140.4 kg)     Height 4' 11"  (1.499 m)     Head Circumference      Peak Flow      Pain Score 8     Pain Loc      Pain Edu?      Excl. in [place]?     Constitutional: Alert and oriented. Well appearing and in no acute distress. Eyes: Conjunctivae are normal Head: Atraumatic HEENT: No congestion/rhinnorhea. Mucous membranes are moist.  Oropharynx non-erythematous, there is erythema and mild induration it is approximately 4 cm to the left cheek no evidence of preseptal or orbital cellulitis.  No evidence of involvement of the parotid.  There is a superficial lesion which is draining slightly and there is a little bit of induration  underneath Neck:   Nontender with no meningismus, no masses, no stridor Cardiovascular: Normal rate, regular rhythm. Grossly normal heart sounds.  Good peripheral circulation. Respiratory: Normal respiratory effort.  No retractions. Lungs CTAB. Abdominal: Soft and nontender. No distention. No guarding no rebound Back:  There is no focal tenderness or step off.  there is no midline tenderness there are no lesions noted. there is no CVA tenderness Musculoskeletal: No lower extremity tenderness, no upper extremity tenderness. No joint effusions, no DVT signs strong distal pulses no edema Neurologic:  Normal speech and language. No gross focal neurologic deficits are appreciated.  Skin:  Skin is warm, dry and intact. No rash noted. Psychiatric: Mood and affect are normal. Speech and behavior are  normal.  ____________________________________________   LABS (all labs ordered are listed, but only abnormal results are displayed)  Labs Reviewed  CBC WITH DIFFERENTIAL/PLATELET - Abnormal; Notable for the following components:      Result Value   WBC 14.7 (*)    Platelets 466 (*)    Neutro Abs 9.5 (*)    Eosinophils Absolute 0.8 (*)    All other components within normal limits  COMPREHENSIVE METABOLIC PANEL - Abnormal; Notable for the following components:   Calcium 8.7 (*)    Albumin 3.4 (*)    All other components within normal limits  AEROBIC CULTURE (SUPERFICIAL SPECIMEN)    Pertinent labs  results that were available during my care of the patient were reviewed by me and considered in my medical decision making (see chart for details). ____________________________________________  EKG  I personally interpreted any EKGs ordered by me or triage  ____________________________________________  RADIOLOGY  Pertinent labs & imaging results that were available during my care of the patient were reviewed by me and considered in my medical decision making (see chart for details). If  possible, patient and/or family made aware of any abnormal findings.  No results found. ____________________________________________    PROCEDURES  Procedure(s) performed: None  Verbal consent obtained from patient, I sterilized the area after a timeout using Chlorex.  I then injected 1 cc in the left cheek over the area of induration of lidocaine, then, once analgesia was obtained I take a 62-MBTDH needle and delicately placed it into the area.  Patient had no tenderness or pain with this.  We did not elicit any purulence and patient tolerated the procedure well   Critical Care performed: None  ____________________________________________   INITIAL IMPRESSION / ASSESSMENT AND PLAN / ED COURSE  Pertinent labs & imaging results that were available during my care of the patient were reviewed by me and considered in my medical decision making (see chart for details).  In no acute distress has a cellulitis, we will give her IV clindamycin here which I think would be a better antibiotic pending cultures I will reculture her.  I do think that there is a possibility that a needle aspiration of that area might be beneficial I did explain to the patient benefits alternatives procedure and she agrees to it.  Therefore, we will do that as well.  See utility in admission is a still very localized cellulitic process with no evidence of septal involvement no evidence of preseptal involvement no evidence of systemic illness.   ----------------------------------------- 8:33 PM on 09/07/2018 ----------------------------------------- Patient with a very limited area of erythema consistent with a cellulitis and no evidence of abscess noted, I did try fine-needle aspiration to the already disrupted skin and did not find any evidence of pus, there is no oral pharyngeal involvement, there is no evidence of involvement of the saliva glands.  Patient did receive IV clindamycin here which I hope will be a more  effective medication for this facial cellulitis, she understands that if there is worsening she must return and she understands that she must follow-up with her primary care doctor for culture results.  Patient is very comfortable with this.  She would prefer to be discharged and admitted at this time but she understands if she worsens she will need to come back    ____________________________________________   FINAL CLINICAL IMPRESSION(S) / ED DIAGNOSES  Final diagnoses:  None      This chart was dictated using voice recognition software.  Despite best  efforts to proofread,  errors can occur which can change meaning.      Schuyler Amor, MD 09/07/18 Remonia Richter    Schuyler Amor, MD 09/07/18 2035

## 2018-09-07 NOTE — ED Triage Notes (Signed)
Pt was seen and diagnosed with cellulitis on Monday. Pt was sent home with antibiotics which pt reports taking 1 day of. Pt states she went to see her PCP today who directed pt to come back to ED for further evaluation due to increase in size on cellulitis. Pt's left cheek appears swollen and red.

## 2018-09-08 ENCOUNTER — Ambulatory Visit: Payer: BLUE CROSS/BLUE SHIELD | Admitting: Physical Therapy

## 2018-09-08 NOTE — ED Provider Notes (Signed)
[facility] Emergency Department Provider Note _________   First MD Initiated Contact with Patient 09/06/18 0345     (approximate)  I have reviewed the triage vital signs and the nursing notes.   HISTORY  Chief Complaint Facial Swelling    HPI [name] is a 48 y.o. female with below list of chronic medical conditions presents to the emergency department with left facial swelling redness with possible "insect bite to the cheek.  Patient states that she is had some drainage from the area consistent with possibly pus.  She denies any fever.  Past Medical History:  Diagnosis Date  . Asthma   . Difficulty breathing   . Family history of breast cancer    BRCA testing letter sent  . Sinus complaint     Patient Active Problem List   Diagnosis Date Noted  . Hemiparesis affecting right side as late effect of cerebrovascular accident (CVA) ([place]) 09/07/2018  . Long term (current) use of insulin ([place]) 09/07/2018  . Thrombocytosis ([place]) 05/30/2018  . Lymphedema of both lower extremities 05/30/2018  . Moderate episode of recurrent major depressive disorder ([place]) 05/30/2018  . Pure hypercholesterolemia 05/30/2018  . Menorrhagia with regular cycle 10/12/2017  . Morbid obesity ([place]) 01/27/2017  . History of vitamin D deficiency 01/27/2017  . History of hypercholesterolemia 01/27/2017  . Fibroids 01/27/2017  . Asthma, well controlled 01/15/2014    Past Surgical History:  Procedure Laterality Date  . CESAREAN SECTION  1989  . COLONOSCOPY    . FOOT SURGERY Left 2013  . HERNIA REPAIR      Prior to Admission medications   Medication Sig Start Date End Date Taking? Authorizing Provider  Albuterol Sulfate (VENTOLIN HFA IN) Inhale into the lungs as needed (for shortness of breath).     [provider]  aspirin 81 MG chewable tablet Chew 1 tablet (81 mg total) by mouth daily. 07/13/18   Gladstone Lighter, MD  atorvastatin (LIPITOR) 40 MG  tablet Take 1 tablet by mouth daily.    [provider]  azithromycin (ZITHROMAX) 250 MG tablet TAKE ONE TABLET BY MOUTH ON MONDAYS WEDNESDAYS AND FRIDAYS. 08/24/18   Erby Pian, MD  cephALEXin (KEFLEX) 500 MG capsule Take 1 capsule (500 mg total) by mouth 2 (two) times daily for 10 days. 09/06/18 09/16/18  Gregor Hams, MD  Cinnamon 500 MG TABS Take 2 tablets by mouth daily.    [provider]  clindamycin (CLEOCIN) 300 MG capsule Take 1 capsule (300 mg total) by mouth 3 (three) times daily for 10 days. 09/07/18 09/17/18  Schuyler Amor, MD  DULoxetine HCl 30 MG CSDR Take 1-2 capsules by mouth daily. First week take one after that take two daily 09/07/18   Steele Sizer, MD  mometasone-formoterol [facility]) 200-5 MCG/ACT AERO Inhale 2 puffs into the lungs 2 (two) times daily. 01/14/17   Erby Pian, MD  montelukast (SINGULAIR) 10 MG tablet Take 1 tablet by mouth daily. 04/22/18 04/22/19  Erby Pian, MD  Multiple Vitamins-Calcium (ONE-A-DAY WITHIN PO) Take 1 tablet by mouth daily.    [provider]  Omega 3 1000 MG CAPS Take 1 capsule by mouth daily.    [provider]  Tiotropium Bromide Monohydrate (SPIRIVA RESPIMAT) 1.25 MCG/ACT AERS Inhale into the lungs. 08/24/18 08/24/19  [provider]  traMADol (ULTRAM) 50 MG tablet Take 50 mg by mouth every 8 (eight) hours as needed. for pain 08/29/18   [provider]  triamcinolone cream (KENALOG) 0.1 % Apply topically. 08/28/18   [provider]  vitamin B-12 (CYANOCOBALAMIN) 1000 MCG tablet Take 1,000 mcg by mouth daily.    [provider]  Vitamin D, Ergocalciferol, (DRISDOL) 1.25 MG (50000 UT) CAPS capsule Take 1 capsule (50,000 Units total) by mouth every 7 (seven) days. For 3 months. 06/20/18   Gae Dry, MD    Allergies Advil [ibuprofen] and Percocet [oxycodone-acetaminophen]  Family History  Problem Relation Age of Onset  . Diabetes Mother   . Uterine  cancer Mother   . Colon cancer Mother   . Cancer - Lung Father   . Colon cancer Father   . Diverticulitis Brother   . Hernia Brother        x2 surgeries  . Colon cancer Paternal Grandmother   . Lung cancer Paternal Grandmother     Social History Social History   Tobacco Use  . Smoking status: Never Smoker  . Smokeless tobacco: Never Used  Substance Use Topics  . Alcohol use: Yes    Comment: occasionally  . Drug use: No    Review of Systems Constitutional: No fever/chills Eyes: No visual changes. ENT: No sore throat. Cardiovascular: Denies chest pain. Respiratory: Denies shortness of breath. Gastrointestinal: No abdominal pain.  No nausea, no vomiting.  No diarrhea.  No constipation. Genitourinary: Negative for dysuria. Musculoskeletal: Negative for neck pain.  Negative for back pain. Integumentary: Left facial swelling redness Neurological: Negative for headaches, focal weakness or numbness.   ____________________________________________   PHYSICAL EXAM:  VITAL SIGNS: ED Triage Vitals [09/06/18 0159]  Enc Vitals Group     BP      Pulse      Resp      Temp      Temp src      SpO2      Weight (!) 139.3 kg (307 lb)     Height 1.549 m (_0 )     Head Circumference      Peak Flow      Pain Score 5     Pain Loc      Pain Edu?      Excl. in [place]?     Constitutional: Alert and oriented. Well appearing and in no acute distress. Eyes: Conjunctivae are normal. Head: 7 x 5 cm of erythema and induration without any flocculence on the left face.  (Cheek) Mouth/Throat: Mucous membranes are moist.  Oropharynx non-erythematous. Neck: No stridor.   Cardiovascular: Normal rate, regular rhythm. Good peripheral circulation. Grossly normal heart sounds. Respiratory: Normal respiratory effort.  No retractions. Lungs CTAB. Gastrointestinal: Soft and nontender. No distention.  Musculoskeletal: No lower extremity tenderness nor edema. No gross deformities of  extremities. Neurologic:  Normal speech and language. No gross focal neurologic deficits are appreciated.  Skin:  Skin is warm, dry and intact. No rash noted. Psychiatric: Mood and affect are normal. Speech and behavior are normal.  ____________________________________________   LABS (all labs ordered are listed, but only abnormal results are displayed)  Labs Reviewed  CBC WITH DIFFERENTIAL/PLATELET - Abnormal; Notable for the following components:      Result Value   WBC 17.1 (*)    Platelets 465 (*)    Neutro Abs 10.7 (*)    Lymphs Abs 4.6 (*)    Eosinophils Absolute 0.9 (*)    All other components within normal limits  COMPREHENSIVE METABOLIC PANEL - Abnormal; Notable for the following components:   Potassium 3.4 (*)    Glucose, Bld 107 (*)  Calcium 8.3 (*)    Albumin 3.3 (*)    All other components within normal limits  LACTIC ACID, PLASMA   ____  Procedures   ____________________________________________   INITIAL IMPRESSION / ASSESSMENT AND PLAN / ED COURSE  As part of my medical decision making, I reviewed the following data within the electronic MEDICAL RECORD NUMBER  48 year old female presented with above-stated history and physical exam secondary to left facial cellulitis.  No evidence of underlying abscess and as such needle aspiration or I&D not performed.  Patient given Keflex in the emergency department will be prescribed the same for home.  Spoke with patient at length regarding warning signs that would warrant immediate return to the emergency department. ____________________________________________  FINAL CLINICAL IMPRESSION(S) / ED DIAGNOSES  Final diagnoses:  Facial cellulitis     MEDICATIONS GIVEN DURING THIS VISIT:  Medications  cephALEXin (KEFLEX) capsule 500 mg (500 mg Oral Given 09/06/18 0419)  diphenhydrAMINE (BENADRYL) capsule 50 mg (50 mg Oral Given 09/06/18 0419)  ondansetron (ZOFRAN-ODT) disintegrating tablet 4 mg (4 mg Oral Given 09/06/18  0502)     ED Discharge Orders         Ordered    cephALEXin (KEFLEX) 500 MG capsule  2 times daily,   Status:  Discontinued     09/06/18 0459    cephALEXin (KEFLEX) 500 MG capsule  2 times daily     09/06/18 0505           Note:  This document was prepared using Dragon voice recognition software and may include unintentional dictation errors.    Gregor Hams, MD 09/08/18 [id number]

## 2018-09-09 ENCOUNTER — Other Ambulatory Visit: Payer: Self-pay | Admitting: Family Medicine

## 2018-09-09 ENCOUNTER — Telehealth: Payer: Self-pay

## 2018-09-09 MED ORDER — DULOXETINE HCL 30 MG PO CPEP: 30.0000 mg | ORAL_CAPSULE | Freq: Every day | ORAL | 0 refills | Status: DC

## 2018-09-09 NOTE — Telephone Encounter (Signed)
It was about her paperwork, Jonelle Sidle already spoke to her today

## 2018-09-09 NOTE — Telephone Encounter (Signed)
Copied from [place]. Topic: General - Other >> Sep 09, 2018 12:15 PM Windy Kalata wrote: Reason for CRM: Patient would like a call from [place], Dr. Ancil Boozer nurse, in regards ot her FMLA paperwork  Best call back is [phone]

## 2018-09-09 NOTE — Telephone Encounter (Signed)
Spoke with [name] and her FMLA claim was approved by Dr. Ancil Boozer until October 16, 2018. Naomi from [place] states patient will get confirmation soon by email. Patient was notified and then transferred up front to [facility] to make her one month appt.

## 2018-09-09 NOTE — Telephone Encounter (Signed)
Copied from [place] [id number]. Topic: General - Other >> Sep 08, 2018  2:48 PM Antonieta Iba C wrote: Reason for CRM:  Pt is requesting a call back from Dr. Ancil Boozer CMA. Pt wouldn't say what call is in regards to   Spoke with lab tech [facility]) she reported that results are still pending.

## 2018-09-10 LAB — WOUND CULTURE
GRAM STAIN:: NONE SEEN
MICRO NUMBER:: 124246
RESULT:: NO GROWTH
SPECIMEN QUALITY: ADEQUATE

## 2018-09-12 ENCOUNTER — Telehealth: Payer: Self-pay

## 2018-09-12 NOTE — Telephone Encounter (Signed)
Paperwork here. Put it in PCP folder to complete

## 2018-09-12 NOTE — Telephone Encounter (Signed)
Copied from [place] [id number]. Topic: General - Other >> Sep 12, 2018 11:45 AM Virl Axe D wrote: Reason for CRM: Lattie Haw with [employer] stated she is faxing over an insurance for for pt's PCP Dr. Ancil Boozer to fill out and fax back. Please be on the lookout for form

## 2018-09-13 ENCOUNTER — Encounter: Payer: BLUE CROSS/BLUE SHIELD | Admitting: Occupational Therapy

## 2018-09-13 ENCOUNTER — Ambulatory Visit (INDEPENDENT_AMBULATORY_CARE_PROVIDER_SITE_OTHER): Payer: BLUE CROSS/BLUE SHIELD | Admitting: Family Medicine

## 2018-09-13 ENCOUNTER — Ambulatory Visit: Payer: BLUE CROSS/BLUE SHIELD | Attending: Family Medicine

## 2018-09-13 ENCOUNTER — Encounter: Payer: Self-pay | Admitting: Family Medicine

## 2018-09-13 VITALS — BP 128/72 | HR 95 | Temp 98.0°F | Resp 16 | Ht 59.0 in | Wt 305.6 lb

## 2018-09-13 DIAGNOSIS — R262 Difficulty in walking, not elsewhere classified: Secondary | ICD-10-CM | POA: Insufficient documentation

## 2018-09-13 DIAGNOSIS — R2689 Other abnormalities of gait and mobility: Secondary | ICD-10-CM | POA: Insufficient documentation

## 2018-09-13 DIAGNOSIS — F331 Major depressive disorder, recurrent, moderate: Secondary | ICD-10-CM

## 2018-09-13 DIAGNOSIS — M6281 Muscle weakness (generalized): Secondary | ICD-10-CM | POA: Diagnosis present

## 2018-09-13 MED ORDER — LORCASERIN HCL ER 20 MG PO TB24: 1.0000 | ORAL_TABLET | Freq: Every day | ORAL | 2 refills | Status: DC

## 2018-09-13 MED ORDER — ESCITALOPRAM OXALATE 5 MG PO TABS: 5.0000 mg | ORAL_TABLET | Freq: Every day | ORAL | 0 refills | Status: DC

## 2018-09-13 NOTE — Therapy (Signed)
[place] MAIN [facility] SERVICES [address] [place], [place], [address] Phone: [phone]   Fax:  [phone]  Physical Therapy Treatment  Patient Details  Name: [name] MRN: [id number] Date of Birth: 08-Aug-1971 Referring Provider (PT): sowles Drue Stager   Encounter Date: 09/13/2018  PT End of Session - 09/13/18 1546    Visit Number  4    Number of Visits  17    Date for PT Re-Evaluation  10/12/18    PT Start Time  6962   pt arrived late    PT Stop Time  1602    PT Time Calculation (min)  38 min    Activity Tolerance  Patient tolerated treatment well;No increased pain    Behavior During Therapy  WFL for tasks assessed/performed       Past Medical History:  Diagnosis Date  . Asthma   . Difficulty breathing   . Family history of breast cancer    BRCA testing letter sent  . Sinus complaint     Past Surgical History:  Procedure Laterality Date  . CESAREAN SECTION  1989  . COLONOSCOPY    . FOOT SURGERY Left 2013  . HERNIA REPAIR      There were no vitals filed for this visit.  Subjective Assessment - 09/13/18 1541    Subjective  Pt reports overall feeling better since last visit. Her right shoulder pain is improving. She has some hip pain in the right which resolved, but her left hip has been hurting some too. She reports she has been working on walking 10 minutes 3 times daily.     Pertinent History  Pt arrived at [facility] on 07/11/18 with complains of weakness in her right arm and leg while at work. She arrived via ambulance to [facility] and received tPA. MRI, MRA and CT all negative, echo normal. She was sent home on Atorvastatin and aspirin since discharge. She was supposed to have home PT but [place] declined her referral. Patient is still having weakness on right side of her body, tingling and has difficulty ambulating. She also states has pain. She is requiring assistance with bathing, cannot clean her house and  mother or husband are driving her around.    Currently in Pain?  Yes    Pain Score  8     Pain Location  --   shoulder      Intervention this date:  -Nu-step- Level one, Left arm omitted 2/2 pain.  5 minutes with SPM goal met in 20s entire time.  -STS from [place] 1x15 -Supine ABDCT heel slides: 1x15 bilat (intermittent cramping sensation in lateral thighs)  -sagittal heel slides 1x10 bilat  -Hooklying bridge 2x15 -Hooklying marching 2x10 bilat (intermittent Left hip cramping) -SAQ on roll 2x15 bilat (VC to avoid recurvatum, remains paifuln)  -Standing hip extension 1x10 bilat, VC for straight leg, unable to perform) -normal stance on airex eyes closed 3x30sec   PT Short Term Goals - 08/17/18 9528      PT SHORT TERM GOAL #1   Title  Patient will report a worst pain of 3/10 on VAS in  right UE and right LE and back to improve tolerance with ADLs and reduced symptoms with activities.     Time  4    Period  Weeks    Status  New    Target Date  09/14/18      PT SHORT TERM GOAL #2   Title  Patient will  be able to perform household work/ chores without increase in symptoms.    Time  4    Period  Weeks    Status  New    Target Date  09/14/18        PT Long Term Goals - 08/17/18 0951      PT LONG TERM GOAL #1   Title  Patient will be independent in home exercise program to improve strength/mobility for better functional independence with ADLs    Time  8    Period  Weeks    Status  New    Target Date  10/12/18      PT LONG TERM GOAL #2   Title  Patient (< 1 years old) will complete five times sit to stand test in < 10 seconds indicating an increased LE strength and improved balance.    Time  8    Period  Weeks    Status  New    Target Date  10/12/18      PT LONG TERM GOAL #3   Title  Patient will increase six minute walk test distance to >1000 for progression to community ambulator and improve gait ability    Time  8    Period  Weeks    Status  New    Target Date   10/12/18      PT LONG TERM GOAL #4   Title  Patient will reduce timed up and go to <11 seconds to reduce fall risk and demonstrate improved transfer/gait ability.    Time  8    Period  Weeks    Status  New    Target Date  10/12/18            Plan - 09/13/18 1548    Clinical Impression Statement  Continued with current program, pt able to tolerate more activie this date 2/2 better pain control. Intermittent cramping in limbs and pain in knees, which could potentially be related to recuvatum in the setting of post CVA weakness, will continue to montior. Pt able to progress intensity on NuStep as well. Pt educated on restriction of water intake after mentioning intake of 3x6oz water bottles daily. Explained this could be contributing to cramping pain at times. Pt progressign well ingeneral, but continues to be limited by pain in 3 limbs intermittently.     Rehab Potential  Good    PT Frequency  2x / week    PT Duration  8 weeks    PT Treatment/Interventions  Aquatic Therapy;Electrical Stimulation;Cryotherapy;Moist Heat;Gait training;Functional mobility training;Therapeutic exercise;Therapeutic activities;Balance training;Patient/family education;Neuromuscular re-education;Passive range of motion;Dry needling    PT Next Visit Plan  mobility training, gait training    PT Home Exercise Plan  R Hooklying SLR, L sidelying R clams, overhead pulleys for R shoulder AAROM    Consulted and Agree with Plan of Care  Patient       Patient will benefit from skilled therapeutic intervention in order to improve the following deficits and impairments:  Difficulty walking, Decreased mobility, Pain, Postural dysfunction, Impaired UE functional use, Impaired flexibility, Decreased strength, Decreased coordination, Decreased activity tolerance, Obesity, Abnormal gait, Decreased balance  Visit Diagnosis: Muscle weakness (generalized)  Difficulty in walking, not elsewhere classified  Other abnormalities of  gait and mobility     Problem List Patient Active Problem List   Diagnosis Date Noted  . Hemiparesis affecting right side as late effect of cerebrovascular accident (CVA) ([place]) 09/07/2018  . Long term (current) use of insulin ([place])  09/07/2018  . Thrombocytosis ([place]) 05/30/2018  . Lymphedema of both lower extremities 05/30/2018  . Moderate episode of recurrent major depressive disorder ([place]) 05/30/2018  . Pure hypercholesterolemia 05/30/2018  . Menorrhagia with regular cycle 10/12/2017  . Morbid obesity ([place]) 01/27/2017  . History of vitamin D deficiency 01/27/2017  . History of hypercholesterolemia 01/27/2017  . Fibroids 01/27/2017  . Asthma, well controlled 01/15/2014   4:26 PM, 09/13/18 Etta Grandchild, PT, DPT Physical Therapist - [facility]  Outpatient Physical Therapy- [place] [phone]     Etta Grandchild 09/13/2018, 4:24 PM  [place] MAIN [facility] SERVICES [address] [place], [place], [address] Phone: [phone]   Fax:  [phone]  Name: [name] MRN: [id number] Date of Birth: February 04, 1971

## 2018-09-13 NOTE — Progress Notes (Signed)
Name: [name]   MRN: [id number]    DOB: 1971/03/03   Date:09/13/2018       Progress Note  Subjective  Chief Complaint  Chief Complaint  Patient presents with  . Obesity    States she was on Saxenda in the past and lost 17 pounds with the sample. States after her stroke she has been depressed about her weight and wanted to talk about other medication options.  . Depression    Would not cover Cymbalta and needs cheaper alternatives     HPI  Moderate Depression: she is feeling down since the stroke, she has a long history of depression however getting worse with all the recent medical problems since Fall of 2019. She was given Wellbutrin but she states not covered by insurance, last visit we gave her duloxetine but also not covered by insurance. We will try lexapro and if too expensive she will get citalopram without insurance at [facility]. Discussed possible side effects   Obesity: she has gained weight since out of work and more stressed. However since last visit on 01/29 she has been eating more salads and vegetables, stopped drinking sodas, no wine, avoiding breast, sweets and weight is down 5 lbs over the past 5 days. She took [place] from October 2019 and lost 17 lbs in 21 days ( different scales - second weight was done at gyn office ) . She states that she was very happy with results, however had to stop once she ran out of samples because insurance denied coverage. She states weight gain causes her to feel depressed, and lack of energy and she would like to try something else since I cannot provide more samples.   Cellulitis: left side of face, taking clindamycin given by [facility] and is doing better, pain under control, redness is down and also swelling has improved    Patient Active Problem List   Diagnosis Date Noted  . Hemiparesis affecting right side as late effect of cerebrovascular accident (CVA) ([place]) 09/07/2018  . Imbalance 09/07/2018  . Lymphedema of both lower extremities  05/30/2018  . Moderate episode of recurrent major depressive disorder ([place]) 05/30/2018  . Pure hypercholesterolemia 05/30/2018  . Menorrhagia with regular cycle 10/12/2017  . Morbid obesity ([place]) 01/27/2017  . History of vitamin D deficiency 01/27/2017  . Fibroids 01/27/2017  . Asthma, well controlled 01/15/2014    Past Surgical History:  Procedure Laterality Date  . CESAREAN SECTION  1989  . COLONOSCOPY    . FOOT SURGERY Left 2013  . HERNIA REPAIR      Family History  Problem Relation Age of Onset  . Diabetes Mother   . Uterine cancer Mother   . Colon cancer Mother   . Cancer - Lung Father   . Colon cancer Father   . Diverticulitis Brother   . Hernia Brother        x2 surgeries  . Colon cancer Paternal Grandmother   . Lung cancer Paternal Grandmother     Social History   Socioeconomic History  . Marital status: Married    Spouse name: Stage manager  . Number of children: 1  . Years of education: Not on file  . Highest education level: Some college, no degree  Occupational History  . Occupation: customer services   Social Needs  . Financial resource strain: Not hard at all  . Food insecurity:    Worry: Never true    Inability: Never true  . Transportation needs:  Medical: No    Non-medical: No  Tobacco Use  . Smoking status: Never Smoker  . Smokeless tobacco: Never Used  Substance and Sexual Activity  . Alcohol use: Yes    Comment: occasionally  . Drug use: No  . Sexual activity: Yes    Partners: Male    Birth control/protection: Pill  Lifestyle  . Physical activity:    Days per week: 5 days    Minutes per session: 30 min  . Stress: Only a little  Relationships  . Social connections:    Talks on phone: More than three times a week    Gets together: Twice a week    Attends religious service: More than 4 times per year    Active member of club or organization: No    Attends meetings of clubs or organizations: Never    Relationship status:  Married  . Intimate partner violence:    Fear of current or ex partner: No    Emotionally abused: No    Physically abused: No    Forced sexual activity: No  Other Topics Concern  . Not on file  Social History Narrative   Married with her high school sweet heart , they had first child at age 33, they recently got married ( on their daughter's birthday 2019), she two miscarriages     Current Outpatient Medications:  .  Albuterol Sulfate (VENTOLIN HFA IN), Inhale into the lungs as needed (for shortness of breath). , Disp: , Rfl:  .  aspirin 81 MG chewable tablet, Chew 1 tablet (81 mg total) by mouth daily., Disp: 30 tablet, Rfl: 0 .  atorvastatin (LIPITOR) 40 MG tablet, Take 1 tablet by mouth daily., Disp: , Rfl:  .  Cinnamon 500 MG TABS, Take 2 tablets by mouth daily., Disp: , Rfl:  .  clindamycin (CLEOCIN) 300 MG capsule, Take 1 capsule (300 mg total) by mouth 3 (three) times daily for 10 days., Disp: 30 capsule, Rfl: 0 .  JENCYCLA 0.35 MG tablet, , Disp: , Rfl:  .  mometasone-formoterol (DULERA) 200-5 MCG/ACT AERO, Inhale 2 puffs into the lungs 2 (two) times daily., Disp: , Rfl:  .  montelukast (SINGULAIR) 10 MG tablet, Take 1 tablet by mouth daily., Disp: , Rfl:  .  Multiple Vitamins-Calcium (ONE-A-DAY WITHIN PO), Take 1 tablet by mouth daily., Disp: , Rfl:  .  Omega 3 1000 MG CAPS, Take 1 capsule by mouth daily., Disp: , Rfl:  .  Tiotropium Bromide Monohydrate (SPIRIVA RESPIMAT) 1.25 MCG/ACT AERS, Inhale into the lungs., Disp: , Rfl:  .  traMADol (ULTRAM) 50 MG tablet, Take 50 mg by mouth every 8 (eight) hours as needed. for pain, Disp: , Rfl:  .  triamcinolone cream (KENALOG) 0.1 %, Apply topically., Disp: , Rfl:  .  vitamin B-12 (CYANOCOBALAMIN) 1000 MCG tablet, Take 1,000 mcg by mouth daily., Disp: , Rfl:  .  Vitamin D, Ergocalciferol, (DRISDOL) 1.25 MG (50000 UT) CAPS capsule, Take 1 capsule (50,000 Units total) by mouth every 7 (seven) days. For 3 months., Disp: 4 capsule, Rfl:  2 .  escitalopram (LEXAPRO) 5 MG tablet, Take 1 tablet (5 mg total) by mouth daily., Disp: 30 tablet, Rfl: 0 .  Lorcaserin HCl ER (BELVIQ XR) 20 MG TB24, Take 1 tablet by mouth daily., Disp: 30 tablet, Rfl: 2  Allergies  Allergen Reactions  . Advil [Ibuprofen] Other (See Comments)    Other Reaction: Other reaction-swells  . Percocet [Oxycodone-Acetaminophen] Nausea And Vomiting and Other (See Comments)  headaches    I personally reviewed active problem list, medication list, allergies, family history with the patient/caregiver today.   ROS  Ten systems reviewed and is negative except as mentioned in HPI   Objective  Vitals:   09/13/18 1627  BP: 128/72  Pulse: 95  Resp: 16  Temp: 98 F (36.7 C)  TempSrc: Oral  SpO2: 97%  Weight: (!) 305 lb 9.6 oz (138.6 kg)  Height: 4' 11"  (1.499 m)    Body mass index is 61.72 kg/m.  Physical Exam  Constitutional: Patient appears well-developed and well-nourished. Obese  No distress.  HEENT: head atraumatic, normocephalic, pupils equal and reactive to light, left side of face slightly swollen, but redness has resolved, neck supple, throat within normal limits Cardiovascular: Normal rate, regular rhythm and normal heart sounds.  No murmur heard. No BLE edema. Pulmonary/Chest: Effort normal and breath sounds normal. No respiratory distress. Abdominal: Soft.  There is no tenderness. Psychiatric: Patient has a normal mood and affect. behavior is normal. Judgment and thought content normal.  Recent Results (from the past 2160 hour(s))  Flow cytometry panel-leukemia/lymphoma work-up     Status: None   Collection Time: 07/01/18 10:49 AM  Result Value Ref Range   PATH INTERP XXX-IMP Comment     Comment: (NOTE) Eosinophilia. No other significant diagnostic immunophenotypic abnormality detected. (See comment.)    ANNOTATION COMMENT IMP Comment     Comment: (NOTE) Eosinophilia can be associated with drug reaction, parasitic infection,  and neoplastic processes, among others. Clinical correlation is necessary.    CLINICAL INFO Comment     Comment: (NOTE) Accompanying CBC dated 07/01/18 shows:  WBC count 10.7, Neu 6.0, Lym 3.0, Mon 0.5, Eos 1.1, Eos% 10.    Specimen Type Comment     Comment: Peripheral blood   ASSESSMENT OF LEUKOCYTES Comment     Comment: (NOTE) No monoclonal B cell population is detected. kappa:lambda ratio 2.3 There is no loss of, or aberrant expression of, the pan T cell antigens to suggest a neoplastic T cell process. CD4:CD8 ratio 5.0 No circulating blasts are detected. There is no immunophenotypic evidence of abnormal myeloid maturation. Eosinophils are increased and account for approximately 10% of leukocytes. Analysis of the leukocyte population shows: Neutophils 62%, eosinophils 10%, monocytes 3%, lymphocytes 25%, blasts <0.5%, B cells 3%, T cells 20%, NK cells 2%.    % Viable Cells Comment     Comment: 90%   ANALYSIS AND GATING STRATEGY Comment     Comment: 8 color analysis with CD45/SSC gating   IMMUNOPHENOTYPING STUDY Comment     Comment: (NOTE) CD2       Normal         CD3       Normal CD4       Normal         CD5       Normal CD7       Normal         CD8       Normal CD10      Normal         CD11b     Normal CD13      Normal         CD14      Normal CD16      Normal         CD19      Normal CD20      Normal         CD33  Normal CD34      Normal         CD38      Normal CD45      Normal         CD56      Normal CD57      Normal         CD117     Normal HLA-DR    Normal         KAPPA     Normal LAMBDA    Normal         CD64      Normal    PATHOLOGIST NAME Comment     Comment: Cecilie Kicks, M. D.   COMMENT: Comment     Comment: (NOTE) Each antibody in this assay was utilized to assess for potential abnormalities of studied cell populations or to characterize identified abnormalities. This test was developed and its performance characteristics determined by  [facility].  It has not been cleared or approved by the U.S. Food and Drug Administration. The FDA has determined that such clearance or approval is not necessary. This test is used for clinical purposes.  It should not be regarded as investigational or for research. Performed At: -[facility] [address] [place], [place] [id number] Nechama Guard MD ZD:[id number] Performed At: [facility] [address] [place], [place] [id number] Nechama Guard MD [id number]:[id number]   BCR-ABL1 FISH     Status: None   Collection Time: 07/01/18 10:49 AM  Result Value Ref Range   Specimen Type BLOOD    Cells Counted 200    Cells Analyzed 200    FISH Result Comment:     Comment: NORMAL:  NO BCR OR ABL1 GENE REARRANGEMENT OBSERVED   Interpretation Comment:     Comment: (NOTE)             nuc ish 9q34(ASS1,ABL1)x2,22q11.2(BCRx2)[200].      The fluorescence in situ hybridization (FISH) study was normal. FISH, using unique sequence DNA probes for the ABL1 and BCR gene regions showed two ABL1 signals (red), two control ASS1 gene signals (aqua) located adjacent to the ABL1 locus at 9q34, and two BCR signals (green) at 22q11.2 in all interphase nuclei examined. There was NO evidence of CML or ALL-associated BCR/ABL1 dual fusion signals in this analysis. .      This analysis is limited to abnormalities detectable by the specific probes included in the study. FISH results should be interpreted within the context of a full cytogenetic analysis and pathology evaluation. .      This test was developed and its performance characteristics determined by [facility]). It has not been cleared or approved by the U.S. Food and Drug Administration. A BCR-ABL1 gene fusion in greater than 3 interphase nuclei in a  patient with a new clinical diagnosis is considered positive. The DNA probe vendor for this study was [employer], research (physical sciences)).    Director  Review: Comment:     Comment: (NOTE) Kirby Crigler, PHD, [place] Performed At: [facility] [address] [place], [place] [id number] Nechama Guard MD RK:[id number] Performed At: [facility] [address] [place], [place] [id number] Rush Farmer MD VP:[id number]   CBC with Differential/Platelet     Status: Abnormal   Collection Time: 07/01/18 10:49 AM  Result Value Ref Range   WBC 10.7 (H) 4.0 - 10.5 K/uL   RBC 4.35 3.87 - 5.11 MIL/uL   Hemoglobin 12.4  12.0 - 15.0 g/dL   HCT 38.1 36.0 - 46.0 %   MCV 87.6 80.0 - 100.0 fL   MCH 28.5 26.0 - 34.0 pg   MCHC 32.5 30.0 - 36.0 g/dL   RDW 13.7 11.5 - 15.5 %   Platelets 340 150 - 400 K/uL   nRBC 0.0 0.0 - 0.2 %    Comment: Performed at [facility], [place], [place] [address]   Neutrophils Relative % ORDER MODIFIED OR RESCHEDULED %   Neutro Abs ORDER MODIFIED OR RESCHEDULED 1.7 - 7.7 K/uL   Band Neutrophils ORDER MODIFIED OR RESCHEDULED %   Lymphocytes Relative ORDER MODIFIED OR RESCHEDULED %   Lymphs Abs ORDER MODIFIED OR RESCHEDULED 0.7 - 4.0 K/uL   Monocytes Relative ORDER MODIFIED OR RESCHEDULED %   Monocytes Absolute ORDER MODIFIED OR RESCHEDULED 0.1 - 1.0 K/uL   Eosinophils Relative ORDER MODIFIED OR RESCHEDULED %   Eosinophils Absolute ORDER MODIFIED OR RESCHEDULED 0.0 - 0.5 K/uL   Basophils Relative ORDER MODIFIED OR RESCHEDULED %   Basophils Absolute ORDER MODIFIED OR RESCHEDULED 0.0 - 0.1 K/uL   WBC Morphology ORDER MODIFIED OR RESCHEDULED    RBC Morphology ORDER MODIFIED OR RESCHEDULED    Smear Review ORDER MODIFIED OR RESCHEDULED    Other ORDER MODIFIED OR RESCHEDULED %   nRBC ORDER MODIFIED OR RESCHEDULED 0 /100 WBC   Metamyelocytes Relative ORDER MODIFIED OR RESCHEDULED %   Myelocytes ORDER MODIFIED OR RESCHEDULED %   Promyelocytes Relative ORDER MODIFIED OR RESCHEDULED %   Blasts ORDER MODIFIED OR RESCHEDULED %  Technologist smear review     Status: None    Collection Time: 07/01/18 10:49 AM  Result Value Ref Range   Tech Review RBC MORPHOLOGY NORMAL     Comment: Normal platelet morphology PLATELETS APPEAR ADEQUATE Reviewed by Janae Bridgeman L. Golden Circle, M.D. [facility] MORPHOLOGY UNREMARKABLE Performed at [facility], [address] [place], [place] [address]   Differential     Status: Abnormal   Collection Time: 07/01/18 11:36 AM  Result Value Ref Range   Neutrophils Relative % 56 %   Neutro Abs 6.0 1.7 - 7.7 K/uL   Lymphocytes Relative 28 %   Lymphs Abs 3.0 0.7 - 4.0 K/uL   Monocytes Relative 5 %   Monocytes Absolute 0.5 0.1 - 1.0 K/uL   Eosinophils Relative 10 %   Eosinophils Absolute 1.1 (H) 0.0 - 0.5 K/uL   Basophils Relative 1 %   Basophils Absolute 0.1 0.0 - 0.1 K/uL   Immature Granulocytes 0 %   Abs Immature Granulocytes 0.04 0.00 - 0.07 K/uL    Comment: Performed at [facility], [address] [place], [place] [address]  Glucose, capillary     Status: None   Collection Time: 07/11/18  1:05 PM  Result Value Ref Range   Glucose-Capillary 87 70 - 99 mg/dL  MRSA PCR Screening     Status: None   Collection Time: 07/11/18  5:20 PM  Result Value Ref Range   MRSA by PCR NEGATIVE NEGATIVE    Comment:        The GeneXpert MRSA Assay (FDA approved for NASAL specimens only), is one component of a comprehensive MRSA colonization surveillance program. It is not intended to diagnose MRSA infection nor to guide or monitor treatment for MRSA infections. Performed at [facility], [address], [place], [place] [address]   ECHOCARDIOGRAM COMPLETE     Status: None   Collection Time: 07/12/18 12:30 PM  Result Value Ref Range   Weight 4,800 oz   Height 59 in   BP 100/63 mmHg  Ethanol     Status: None   Collection Time: 07/12/18  8:57 PM  Result Value Ref Range   Alcohol, Ethyl (B) <10 <10 mg/dL    Comment: (NOTE) Lowest detectable limit for serum alcohol is 10 mg/dL. For medical purposes only. Performed at  [facility], [address] [place], [place] [address]   Protime-INR     Status: None   Collection Time: 07/12/18  8:57 PM  Result Value Ref Range   Prothrombin Time 14.5 11.4 - 15.2 seconds   INR 1.14     Comment: Performed at [facility], [address] [place], [place] [address]  APTT     Status: None   Collection Time: 07/12/18  8:57 PM  Result Value Ref Range   aPTT 31 24 - 36 seconds    Comment: Performed at [facility], [address] [place], [place] [address]  CBC     Status: Abnormal   Collection Time: 07/12/18  8:57 PM  Result Value Ref Range   WBC 13.0 (H) 4.0 - 10.5 K/uL   RBC 4.42 3.87 - 5.11 MIL/uL   Hemoglobin 12.8 12.0 - 15.0 g/dL   HCT 39.3 36.0 - 46.0 %   MCV 88.9 80.0 - 100.0 fL   MCH 29.0 26.0 - 34.0 pg   MCHC 32.6 30.0 - 36.0 g/dL   RDW 14.1 11.5 - 15.5 %   Platelets 441 (H) 150 - 400 K/uL   nRBC 0.0 0.0 - 0.2 %    Comment: Performed at [facility], [address] [place], [place] [address]  Differential     Status: Abnormal   Collection Time: 07/12/18  8:57 PM  Result Value Ref Range   Neutrophils Relative % 59 %   Neutro Abs 7.8 (H) 1.7 - 7.7 K/uL   Lymphocytes Relative 27 %   Lymphs Abs 3.5 0.7 - 4.0 K/uL   Monocytes Relative 5 %   Monocytes Absolute 0.7 0.1 - 1.0 K/uL   Eosinophils Relative 7 %   Eosinophils Absolute 0.9 (H) 0.0 - 0.5 K/uL   Basophils Relative 1 %   Basophils Absolute 0.1 0.0 - 0.1 K/uL   Immature Granulocytes 1 %   Abs Immature Granulocytes 0.07 0.00 - 0.07 K/uL    Comment: Performed at [facility], [address] [place],  [address]  Comprehensive metabolic panel     Status: Abnormal   Collection Time: 07/12/18  8:57 PM  Result Value Ref Range   Sodium 137 135 - 145 mmol/L   Potassium 4.1 3.5 - 5.1 mmol/L   Chloride 107 98 - 111 mmol/L   CO2 21 (L) 22 - 32 mmol/L   Glucose, Bld 128 (H) 70 - 99 mg/dL   BUN 17 6 - 20 mg/dL   Creatinine, Ser 0.85 0.44 -  1.00 mg/dL   Calcium 8.6 (L) 8.9 - 10.3 mg/dL   Total Protein 6.5 6.5 - 8.1 g/dL   Albumin 3.3 (L) 3.5 - 5.0 g/dL   AST 19 15 - 41 U/L   ALT 12 0 - 44 U/L   Alkaline Phosphatase 68 38 - 126 U/L   Total Bilirubin 0.3 0.3 - 1.2 mg/dL   GFR calc non Af Amer >60 >60 mL/min   GFR calc Af Amer >60 >60 mL/min   Anion gap 9 5 - 15  Comment: Performed at [facility], [address] [place], [place] [address]  Troponin I - ONCE - STAT     Status: None   Collection Time: 07/12/18  8:57 PM  Result Value Ref Range   Troponin I <0.03 <0.03 ng/mL    Comment: Performed at [facility], [address] [place], [place] [address]  HIV antibody (Routine Testing)     Status: None   Collection Time: 07/12/18  8:57 PM  Result Value Ref Range   HIV Screen 4th Generation wRfx Non Reactive Non Reactive    Comment: (NOTE) Performed At: [facility] [place], [place] [id number] Rush Farmer MD [id number]   Triglycerides     Status: None   Collection Time: 07/12/18  8:57 PM  Result Value Ref Range   Triglycerides 82 <150 mg/dL    Comment: Performed at [facility], [address] [place], [place] [address]  Urine Drug Screen, Qualitative     Status: None   Collection Time: 07/13/18  3:53 AM  Result Value Ref Range   Tricyclic, Ur Screen NONE DETECTED NONE DETECTED   Amphetamines, Ur Screen NONE DETECTED NONE DETECTED   MDMA (Ecstasy)Ur Screen NONE DETECTED NONE DETECTED   Cocaine Metabolite,Ur [place] NONE DETECTED NONE DETECTED   Opiate, Ur Screen NONE DETECTED NONE DETECTED   Phencyclidine (PCP) Ur S NONE DETECTED NONE DETECTED   Cannabinoid 50 Ng, Ur [place] NONE DETECTED NONE DETECTED   Barbiturates, Ur Screen NONE DETECTED NONE DETECTED   Benzodiazepine, Ur Scrn NONE DETECTED NONE DETECTED   Methadone Scn, Ur NONE DETECTED NONE DETECTED    Comment: (NOTE) Tricyclics + metabolites, urine    Cutoff 1000 ng/mL Amphetamines + metabolites, urine   Cutoff 1000 ng/mL MDMA (Ecstasy), urine              Cutoff 500 ng/mL Cocaine Metabolite, urine          Cutoff 300 ng/mL Opiate + metabolites, urine        Cutoff 300 ng/mL Phencyclidine (PCP), urine         Cutoff 25 ng/mL Cannabinoid, urine                 Cutoff 50 ng/mL Barbiturates + metabolites, urine  Cutoff 200 ng/mL Benzodiazepine, urine              Cutoff 200 ng/mL Methadone, urine                   Cutoff 300 ng/mL The urine drug screen provides only a preliminary, unconfirmed analytical test result and should not be used for non-medical purposes. Clinical consideration and professional judgment should be applied to any positive drug screen result due to possible interfering substances. A more specific alternate chemical method must be used in order to obtain a confirmed analytical result. Gas chromatography / mass spectrometry (GC/MS) is the preferred confirmat ory method. Performed at [facility], [address] [place],  [address]   Urinalysis, Routine w reflex microscopic     Status: Abnormal   Collection Time: 07/13/18  3:53 AM  Result Value Ref Range   Color, Urine STRAW (A) YELLOW   APPearance CLEAR (A) CLEAR   Specific Gravity, Urine 1.005 1.005 - 1.030   pH 6.0 5.0 - 8.0   Glucose, UA NEGATIVE NEGATIVE mg/dL   Hgb urine dipstick SMALL (A) NEGATIVE   Bilirubin Urine NEGATIVE NEGATIVE   Ketones, ur NEGATIVE NEGATIVE mg/dL   Protein,  ur NEGATIVE NEGATIVE mg/dL   Nitrite NEGATIVE NEGATIVE   Leukocytes, UA NEGATIVE NEGATIVE   RBC / HPF 0-5 0 - 5 RBC/hpf   WBC, UA 0-5 0 - 5 WBC/hpf   Bacteria, UA RARE (A) NONE SEEN   Squamous Epithelial / LPF 0-5 0 - 5    Comment: Performed at [facility], [address] [place], [place] [address]  CBC with Differential     Status: Abnormal   Collection Time: 09/06/18  2:08 AM  Result Value Ref Range   WBC 17.1 (H) 4.0 - 10.5 K/uL   RBC 4.63 3.87 - 5.11 MIL/uL   Hemoglobin 13.2 12.0 - 15.0  g/dL   HCT 40.4 36.0 - 46.0 %   MCV 87.3 80.0 - 100.0 fL   MCH 28.5 26.0 - 34.0 pg   MCHC 32.7 30.0 - 36.0 g/dL   RDW 14.3 11.5 - 15.5 %   Platelets 465 (H) 150 - 400 K/uL   nRBC 0.0 0.0 - 0.2 %   Neutrophils Relative % 62 %   Neutro Abs 10.7 (H) 1.7 - 7.7 K/uL   Lymphocytes Relative 27 %   Lymphs Abs 4.6 (H) 0.7 - 4.0 K/uL   Monocytes Relative 5 %   Monocytes Absolute 0.8 0.1 - 1.0 K/uL   Eosinophils Relative 5 %   Eosinophils Absolute 0.9 (H) 0.0 - 0.5 K/uL   Basophils Relative 1 %   Basophils Absolute 0.1 0.0 - 0.1 K/uL   Immature Granulocytes 0 %   Abs Immature Granulocytes 0.07 0.00 - 0.07 K/uL    Comment: Performed at [facility], [address] [place], [place] [address]  Comprehensive metabolic panel     Status: Abnormal   Collection Time: 09/06/18  2:08 AM  Result Value Ref Range   Sodium 140 135 - 145 mmol/L   Potassium 3.4 (L) 3.5 - 5.1 mmol/L   Chloride 106 98 - 111 mmol/L   CO2 27 22 - 32 mmol/L   Glucose, Bld 107 (H) 70 - 99 mg/dL   BUN 14 6 - 20 mg/dL   Creatinine, Ser 0.78 0.44 - 1.00 mg/dL   Calcium 8.3 (L) 8.9 - 10.3 mg/dL   Total Protein 7.2 6.5 - 8.1 g/dL   Albumin 3.3 (L) 3.5 - 5.0 g/dL   AST 21 15 - 41 U/L   ALT 20 0 - 44 U/L   Alkaline Phosphatase 85 38 - 126 U/L   Total Bilirubin 0.4 0.3 - 1.2 mg/dL   GFR calc non Af Amer >60 >60 mL/min   GFR calc Af Amer >60 >60 mL/min   Anion gap 7 5 - 15    Comment: Performed at [facility], [address] [place], [place] [address]  Lactic acid, plasma     Status: None   Collection Time: 09/06/18  2:08 AM  Result Value Ref Range   Lactic Acid, Venous 0.9 0.5 - 1.9 mmol/L    Comment: Performed at [facility], [address] [place], [place] [address]  Wound culture     Status: None   Collection Time: 09/07/18 12:23 PM  Result Value Ref Range   MICRO NUMBER: [id number]    SPECIMEN QUALITY: Adequate    SOURCE: ABSCESS, FACIAL    STATUS: FINAL    GRAM STAIN: No organisms  or white blood cells seen    RESULT: No Growth   CBC with Differential     Status: Abnormal   Collection Time: 09/07/18  3:44 PM  Result Value Ref Range   WBC 14.7 (H) 4.0 - 10.5 K/uL   RBC 4.65 3.87 - 5.11 MIL/uL   Hemoglobin 13.2 12.0 - 15.0 g/dL   HCT 41.3 36.0 - 46.0 %   MCV 88.8 80.0 - 100.0 fL   MCH 28.4 26.0 - 34.0 pg   MCHC 32.0 30.0 - 36.0 g/dL   RDW 14.3 11.5 - 15.5 %   Platelets 466 (H) 150 - 400 K/uL   nRBC 0.0 0.0 - 0.2 %   Neutrophils Relative % 65 %   Neutro Abs 9.5 (H) 1.7 - 7.7 K/uL   Lymphocytes Relative 25 %   Lymphs Abs 3.7 0.7 - 4.0 K/uL   Monocytes Relative 4 %   Monocytes Absolute 0.6 0.1 - 1.0 K/uL   Eosinophils Relative 5 %   Eosinophils Absolute 0.8 (H) 0.0 - 0.5 K/uL   Basophils Relative 1 %   Basophils Absolute 0.1 0.0 - 0.1 K/uL   Immature Granulocytes 0 %   Abs Immature Granulocytes 0.04 0.00 - 0.07 K/uL    Comment: Performed at [facility] [facility], [address] [place], [place] [address]  Comprehensive metabolic panel     Status: Abnormal   Collection Time: 09/07/18  3:44 PM  Result Value Ref Range   Sodium 141 135 - 145 mmol/L   Potassium 3.6 3.5 - 5.1 mmol/L   Chloride 105 98 - 111 mmol/L   CO2 29 22 - 32 mmol/L   Glucose, Bld 99 70 - 99 mg/dL   BUN 10 6 - 20 mg/dL   Creatinine, Ser 0.84 0.44 - 1.00 mg/dL   Calcium 8.7 (L) 8.9 - 10.3 mg/dL   Total Protein 7.3 6.5 - 8.1 g/dL   Albumin 3.4 (L) 3.5 - 5.0 g/dL   AST 18 15 - 41 U/L   ALT 17 0 - 44 U/L   Alkaline Phosphatase 87 38 - 126 U/L   Total Bilirubin 0.4 0.3 - 1.2 mg/dL   GFR calc non Af Amer >60 >60 mL/min   GFR calc Af Amer >60 >60 mL/min   Anion gap 7 5 - 15    Comment: Performed at [facility], [address] [place], [place] [address]      PHQ2/9: Depression screen [facility] 2/9 09/13/2018 09/07/2018 07/18/2018 05/30/2018  Decreased Interest 3 3 0 3  Down, Depressed, Hopeless 1 1 0 2  PHQ - 2 Score 4 4 0 5  Altered sleeping 1 1 2 2   Tired, decreased energy 1 1  2 2   Change in appetite 3 3 0 2  Feeling bad or failure about yourself  2 2 0 2  Trouble concentrating 2 2 0 0  Moving slowly or fidgety/restless - 2 0 0  Suicidal thoughts 1 1 0 1  PHQ-9 Score 14 16 4 14   Difficult doing work/chores Somewhat difficult Somewhat difficult Not difficult at all Somewhat difficult     Fall Risk: Fall Risk  09/07/2018 07/18/2018 05/30/2018  Falls in the past year? 1 0 No  Comment August 08, 2018 - -  Number falls in past yr: 0 0 -  Injury with Fall? 1 0 -  Comment right fracture clavicle  - -  Risk for fall due to : Other (Comment) - -  Risk for fall due to: Comment syncopy episode - -     Assessment & Plan  1. Moderate episode of recurrent major depressive disorder (HCC)  - escitalopram (LEXAPRO) 5 MG tablet;  Take 1 tablet (5 mg total) by mouth daily.  Dispense: 30 tablet; Refill: 0  2. Morbid (severe) obesity due to excess calories (HCC)  - Lorcaserin HCl ER (BELVIQ XR) 20 MG TB24; Take 1 tablet by mouth daily.  Dispense: 30 tablet; Refill: 2

## 2018-09-13 NOTE — Telephone Encounter (Signed)
Completed, however she needs to sign prior to [place] faxing it .  [name] is trying to contact her now

## 2018-09-13 NOTE — Telephone Encounter (Signed)
Patient has been notified and she will come to office to sign.  The form has been placed in drawer at front desk.

## 2018-09-15 ENCOUNTER — Ambulatory Visit: Payer: BLUE CROSS/BLUE SHIELD | Admitting: Physical Therapy

## 2018-09-15 ENCOUNTER — Encounter: Payer: BLUE CROSS/BLUE SHIELD | Admitting: Occupational Therapy

## 2018-09-19 ENCOUNTER — Other Ambulatory Visit: Payer: Self-pay | Admitting: Family Medicine

## 2018-09-19 ENCOUNTER — Ambulatory Visit: Payer: BLUE CROSS/BLUE SHIELD | Admitting: Physical Therapy

## 2018-09-19 ENCOUNTER — Encounter: Payer: BLUE CROSS/BLUE SHIELD | Admitting: Occupational Therapy

## 2018-09-19 ENCOUNTER — Encounter: Payer: Self-pay | Admitting: Physical Therapy

## 2018-09-19 ENCOUNTER — Ambulatory Visit: Payer: Self-pay | Admitting: *Deleted

## 2018-09-19 DIAGNOSIS — R2689 Other abnormalities of gait and mobility: Secondary | ICD-10-CM

## 2018-09-19 DIAGNOSIS — M6281 Muscle weakness (generalized): Secondary | ICD-10-CM

## 2018-09-19 DIAGNOSIS — R262 Difficulty in walking, not elsewhere classified: Secondary | ICD-10-CM

## 2018-09-19 MED ORDER — FLUCONAZOLE 150 MG PO TABS: 150.0000 mg | ORAL_TABLET | ORAL | 0 refills | Status: DC

## 2018-09-19 NOTE — Therapy (Signed)
[place] MAIN [facility] SERVICES [address] [place], [place], [address] Phone: [phone]   Fax:  [phone]  Physical Therapy Treatment  Patient Details  Name: [name] MRN: [id number] Date of Birth: February 13, 1971 Referring Provider (PT): sowles Drue Stager   Encounter Date: 09/19/2018  PT End of Session - 09/19/18 1438    Visit Number  4    Number of Visits  17    Date for PT Re-Evaluation  10/12/18    PT Start Time  0230    PT Stop Time  0315    PT Time Calculation (min)  45 min    Activity Tolerance  Patient tolerated treatment well;No increased pain    Behavior During Therapy  WFL for tasks assessed/performed       Past Medical History:  Diagnosis Date  . Asthma   . Difficulty breathing   . Family history of breast cancer    BRCA testing letter sent  . Sinus complaint     Past Surgical History:  Procedure Laterality Date  . CESAREAN SECTION  1989  . COLONOSCOPY    . FOOT SURGERY Left 2013  . HERNIA REPAIR      There were no vitals filed for this visit.  Subjective Assessment - 09/19/18 1438    Subjective   Her right shoulder pain is hurting 8/10. She has some hip pain in the right is 4/10 .    Pertinent History  Pt arrived at [facility] on 07/11/18 with complains of weakness in her right arm and leg while at work. She arrived via ambulance to [facility] and received tPA. MRI, MRA and CT all negative, echo normal. She was sent home on Atorvastatin and aspirin since discharge. She was supposed to have home PT but [place] declined her referral. Patient is still having weakness on right side of her body, tingling and has difficulty ambulating. She also states has pain. She is requiring assistance with bathing, cannot clean her house and mother or husband are driving her around.       Intervention this date:  Nu-step- Level one,no UE due to right UE pain  5 minutes with SPM goal met in 50s entire time.  STS from [place]  1x15 Supine  heel slides: 1x15 bilat   Hooklying bridge 2x15 Hooklying marching 2x10 bilat  SAQ on roll 2x15 bilat   LAQ x 10 x 2, Seated marching x 10 x 2  Leg press 60 lbs x 20 x 2    CGA and Min to mod verbal cues used throughout treatment. Patient has some pain with left knee heel slide and needs rest peroids.                       PT Education - 09/19/18 1438    Education Details  HEP    Person(s) Educated  Patient    Methods  Explanation    Comprehension  Verbalized understanding;Returned demonstration;Need further instruction       PT Short Term Goals - 08/17/18 1751      PT SHORT TERM GOAL #1   Title  Patient will report a worst pain of 3/10 on VAS in  right UE and right LE and back to improve tolerance with ADLs and reduced symptoms with activities.     Time  4    Period  Weeks    Status  New    Target Date  09/14/18  PT SHORT TERM GOAL #2   Title  Patient will be able to perform household work/ chores without increase in symptoms.    Time  4    Period  Weeks    Status  New    Target Date  09/14/18        PT Long Term Goals - 08/17/18 0951      PT LONG TERM GOAL #1   Title  Patient will be independent in home exercise program to improve strength/mobility for better functional independence with ADLs    Time  8    Period  Weeks    Status  New    Target Date  10/12/18      PT LONG TERM GOAL #2   Title  Patient (< 95 years old) will complete five times sit to stand test in < 10 seconds indicating an increased LE strength and improved balance.    Time  8    Period  Weeks    Status  New    Target Date  10/12/18      PT LONG TERM GOAL #3   Title  Patient will increase six minute walk test distance to >1000 for progression to community ambulator and improve gait ability    Time  8    Period  Weeks    Status  New    Target Date  10/12/18      PT LONG TERM GOAL #4   Title  Patient will reduce timed up and go to <11 seconds to  reduce fall risk and demonstrate improved transfer/gait ability.    Time  8    Period  Weeks    Status  New    Target Date  10/12/18            Plan - 09/19/18 1439    Clinical Impression Statement  Continued with current program, pt able to tolerate more activie this date 2/2 better pain control. Intermittent cramping in limbs and pain in knees, which could potentially be related to recuvatum in the setting of post CVA weakness, will continue to montior. Pt able to progress intensity on NuStep as well. Pt educated on restriction of water intake after mentioning intake of 3x6oz water bottles daily. Explained this could be contributing to cramping pain at times. Pt progressign well ingeneral, but continues to be limited by pain  intermittently.    Rehab Potential  Good    PT Frequency  2x / week    PT Duration  8 weeks    PT Treatment/Interventions  Aquatic Therapy;Electrical Stimulation;Cryotherapy;Moist Heat;Gait training;Functional mobility training;Therapeutic exercise;Therapeutic activities;Balance training;Patient/family education;Neuromuscular re-education;Passive range of motion;Dry needling    PT Next Visit Plan  mobility training, gait training    PT Home Exercise Plan  R Hooklying SLR, L sidelying R clams, overhead pulleys for R shoulder AAROM    Consulted and Agree with Plan of Care  Patient       Patient will benefit from skilled therapeutic intervention in order to improve the following deficits and impairments:  Difficulty walking, Decreased mobility, Pain, Postural dysfunction, Impaired UE functional use, Impaired flexibility, Decreased strength, Decreased coordination, Decreased activity tolerance, Obesity, Abnormal gait, Decreased balance  Visit Diagnosis: Muscle weakness (generalized)  Difficulty in walking, not elsewhere classified  Other abnormalities of gait and mobility     Problem List Patient Active Problem List   Diagnosis Date Noted  . Hemiparesis  affecting right side as late effect of cerebrovascular accident (CVA) ([place]) 09/07/2018  .  Imbalance 09/07/2018  . Lymphedema of both lower extremities 05/30/2018  . Moderate episode of recurrent major depressive disorder ([place]) 05/30/2018  . Pure hypercholesterolemia 05/30/2018  . Menorrhagia with regular cycle 10/12/2017  . Morbid obesity ([place]) 01/27/2017  . History of vitamin D deficiency 01/27/2017  . Fibroids 01/27/2017  . Asthma, well controlled 01/15/2014    Alanson Puls, [place] DPT 09/19/2018, 2:41 PM  [place] MAIN [facility] SERVICES [address] [place], [place], [address] Phone: [phone]   Fax:  [phone]  Name: [name] MRN: [id number] Date of Birth: 06/20/1971

## 2018-09-19 NOTE — Telephone Encounter (Signed)
Contacted pt regarding her symptoms; she states that she has been having vaginal itching since last week; the pt says that she has been on a lot of antibiotics; the pt says that she is having a yellow vaginal discharrgee, and cloudy urine; she also says that her last dose of antibiotic is today; nurse triage initiated and recommendations made per  Protocol;the pt states "she knows that it is the antibiotic that is causing this problem because she knows her body, and she is not coming in for a visit; the pt would like something called in to [facility]; she can be contacted at [phone]; pt last seen by Dr Ancil Boozer 09/07/2018; notified Suanne Marker; will route to office for final disposition.  Reason for Disposition . MODERATE-SEVERE itching (i.e., interferes with school, work, or sleep)  Answer Assessment - Initial Assessment Questions 1. SYMPTOM: "What's the main symptom you're concerned about?" (e.g., pain, itching, dryness)     Itching and discharge 2. LOCATION: "Where is the   located?" (e.g., inside/outside, left/right)     Inside of vagina and lips 3. ONSET: "When did the    start?"  09/13/2018 4. PAIN: "Is there any pain?" If so, ask: "How bad is it?" (Scale: 1-10; mild, moderate, severe)     no 5. ITCHING: "Is there any itching?" If so, ask: "How bad is it?" (Scale: 1-10; mild, moderate, severe)     severe 6. CAUSE: "What do you think is causing the discharge?" "Have you had the same problem before? What happened then?"     Yellow discharge; pt had similar problem when taking antbiotics 7. OTHER SYMPTOMS: "Do you have any other symptoms?" (e.g., fever, itching, vaginal bleeding, pain with urination, injury to genital area, vaginal foreign body)     Vaginal itching; Cloudy urine 8. PREGNANCY: "Is there any chance you are pregnant?" "When was your last menstrual period?"     No; LMP week of 09/11/2018 pt does not know the exact date  Protocols used: VAGINAL [facility]

## 2018-09-19 NOTE — Telephone Encounter (Signed)
Sent diflucan to [address]

## 2018-09-19 NOTE — Addendum Note (Signed)
Addended by: Chilton Greathouse on: 09/19/2018 01:59 PM   Modules accepted: Orders

## 2018-09-19 NOTE — Telephone Encounter (Signed)
Please resend to [facility] on [address].

## 2018-09-19 NOTE — Addendum Note (Signed)
Addended by: Steele Sizer F on: 09/19/2018 02:57 PM   Modules accepted: Orders

## 2018-09-21 ENCOUNTER — Encounter: Payer: Self-pay | Admitting: Physical Therapy

## 2018-09-21 ENCOUNTER — Encounter: Payer: BLUE CROSS/BLUE SHIELD | Admitting: Occupational Therapy

## 2018-09-21 ENCOUNTER — Ambulatory Visit: Payer: BLUE CROSS/BLUE SHIELD | Admitting: Physical Therapy

## 2018-09-21 DIAGNOSIS — M6281 Muscle weakness (generalized): Secondary | ICD-10-CM | POA: Diagnosis not present

## 2018-09-21 DIAGNOSIS — R262 Difficulty in walking, not elsewhere classified: Secondary | ICD-10-CM

## 2018-09-21 DIAGNOSIS — R2689 Other abnormalities of gait and mobility: Secondary | ICD-10-CM

## 2018-09-21 NOTE — Therapy (Signed)
[place] MAIN [facility] SERVICES [address] [place], [place], [address] Phone: [phone]   Fax:  [phone]  Physical Therapy Treatment  Patient Details  Name: [name] MRN: [id number] Date of Birth: Jul 17, 1971 Referring Provider (PT): sowles Drue Stager   Encounter Date: 09/21/2018  PT End of Session - 09/21/18 1446    Visit Number  6    Number of Visits  17    Date for PT Re-Evaluation  10/12/18    PT Start Time  0236    PT Stop Time  0315    PT Time Calculation (min)  39 min    Activity Tolerance  Patient tolerated treatment well;No increased pain    Behavior During Therapy  WFL for tasks assessed/performed       Past Medical History:  Diagnosis Date  . Asthma   . Difficulty breathing   . Family history of breast cancer    BRCA testing letter sent  . Sinus complaint     Past Surgical History:  Procedure Laterality Date  . CESAREAN SECTION  1989  . COLONOSCOPY    . FOOT SURGERY Left 2013  . HERNIA REPAIR      There were no vitals filed for this visit.  Subjective Assessment - 09/21/18 1443    Subjective   Her right shoulder pain is hurting 8/10. She has some knee pain in the right is 5/10 . She says that her right knee has buckled 2 times last week and one time the week before.     Pertinent History  Pt arrived at [facility] on 07/11/18 with complains of weakness in her right arm and leg while at work. She arrived via ambulance to [facility] and received tPA. MRI, MRA and CT all negative, echo normal. She was sent home on Atorvastatin and aspirin since discharge. She was supposed to have home PT but [place] declined her referral. Patient is still having weakness on right side of her body, tingling and has difficulty ambulating. She also states has pain. She is requiring assistance with bathing, cannot clean her house and mother or husband are driving her around.    Pain Onset  More than a month ago         Treatment: Heel slides ( left side in pillowcase and on sliding board) x 20 , pain in left knee Hip abd/add x 20  hooklying marching Bridging x 20 SAQ x 20  Standing hip abd x 20  Standing hip extension x 20  Heel raises x 20  Patient has pain behaviors with left knee and right shoulder. Recommended rollator due to right knee buckling                        PT Education - 09/21/18 1445    Education Details  HEP    Person(s) Educated  Patient    Methods  Explanation    Comprehension  Returned demonstration;Verbalized understanding;Need further instruction       PT Short Term Goals - 08/17/18 0953      PT SHORT TERM GOAL #1   Title  Patient will report a worst pain of 3/10 on VAS in  right UE and right LE and back to improve tolerance with ADLs and reduced symptoms with activities.     Time  4    Period  Weeks    Status  New    Target Date  09/14/18  PT SHORT TERM GOAL #2   Title  Patient will be able to perform household work/ chores without increase in symptoms.    Time  4    Period  Weeks    Status  New    Target Date  09/14/18        PT Long Term Goals - 08/17/18 0951      PT LONG TERM GOAL #1   Title  Patient will be independent in home exercise program to improve strength/mobility for better functional independence with ADLs    Time  8    Period  Weeks    Status  New    Target Date  10/12/18      PT LONG TERM GOAL #2   Title  Patient (< 53 years old) will complete five times sit to stand test in < 10 seconds indicating an increased LE strength and improved balance.    Time  8    Period  Weeks    Status  New    Target Date  10/12/18      PT LONG TERM GOAL #3   Title  Patient will increase six minute walk test distance to >1000 for progression to community ambulator and improve gait ability    Time  8    Period  Weeks    Status  New    Target Date  10/12/18      PT LONG TERM GOAL #4   Title  Patient will reduce timed up and go  to <11 seconds to reduce fall risk and demonstrate improved transfer/gait ability.    Time  8    Period  Weeks    Status  New    Target Date  10/12/18            Plan - 09/21/18 1459    Clinical Impression Statement  Patient has pain in left knee 7/10 with exercise, and right knee feels like it could give away. She is able to perform LE exercises but is limited by pain. She will conintue to benefit from skilled PT to improve saftey and mobility.    Rehab Potential  Good    PT Frequency  2x / week    PT Duration  8 weeks    PT Treatment/Interventions  Aquatic Therapy;Electrical Stimulation;Cryotherapy;Moist Heat;Gait training;Functional mobility training;Therapeutic exercise;Therapeutic activities;Balance training;Patient/family education;Neuromuscular re-education;Passive range of motion;Dry needling    PT Next Visit Plan  mobility training, gait training    PT Home Exercise Plan  R Hooklying SLR, L sidelying R clams, overhead pulleys for R shoulder AAROM    Consulted and Agree with Plan of Care  Patient       Patient will benefit from skilled therapeutic intervention in order to improve the following deficits and impairments:  Difficulty walking, Decreased mobility, Pain, Postural dysfunction, Impaired UE functional use, Impaired flexibility, Decreased strength, Decreased coordination, Decreased activity tolerance, Obesity, Abnormal gait, Decreased balance  Visit Diagnosis: Muscle weakness (generalized)  Difficulty in walking, not elsewhere classified  Other abnormalities of gait and mobility     Problem List Patient Active Problem List   Diagnosis Date Noted  . Hemiparesis affecting right side as late effect of cerebrovascular accident (CVA) ([place]) 09/07/2018  . Imbalance 09/07/2018  . Lymphedema of both lower extremities 05/30/2018  . Moderate episode of recurrent major depressive disorder ([place]) 05/30/2018  . Pure hypercholesterolemia 05/30/2018  . Menorrhagia with  regular cycle 10/12/2017  . Morbid obesity ([place]) 01/27/2017  . History of vitamin D  deficiency 01/27/2017  . Fibroids 01/27/2017  . Asthma, well controlled 01/15/2014    Alanson Puls PT DPT 09/21/2018, 3:36 PM  [place] MAIN [facility] SERVICES [address] [place], [place], [address] Phone: [phone]   Fax:  [phone]  Name: [name] MRN: [id number] Date of Birth: October 02, 1970

## 2018-09-26 ENCOUNTER — Encounter: Payer: BLUE CROSS/BLUE SHIELD | Admitting: Occupational Therapy

## 2018-09-26 ENCOUNTER — Ambulatory Visit: Payer: BLUE CROSS/BLUE SHIELD | Admitting: Physical Therapy

## 2018-09-26 ENCOUNTER — Encounter: Payer: Self-pay | Admitting: Physical Therapy

## 2018-09-26 DIAGNOSIS — R2689 Other abnormalities of gait and mobility: Secondary | ICD-10-CM

## 2018-09-26 DIAGNOSIS — R262 Difficulty in walking, not elsewhere classified: Secondary | ICD-10-CM

## 2018-09-26 DIAGNOSIS — M6281 Muscle weakness (generalized): Secondary | ICD-10-CM

## 2018-09-26 NOTE — Therapy (Addendum)
[place] MAIN [facility] SERVICES [address] [place], [place], [address] Phone: [phone]   Fax:  [phone]  Physical Therapy Treatment  Patient Details  Name: Quadasia Newsham MRN: [id number] Date of Birth: 07/24/1971 Referring Provider (PT): sowles Drue Stager   Encounter Date: 09/26/2018  PT End of Session - 09/26/18 0946    Visit Number  7    Number of Visits  17    Date for PT Re-Evaluation  10/12/18    PT Start Time  0930    PT Stop Time  1010    PT Time Calculation (min)  40 min    Activity Tolerance  Patient tolerated treatment well;No increased pain    Behavior During Therapy  WFL for tasks assessed/performed       Past Medical History:  Diagnosis Date  . Asthma   . Difficulty breathing   . Family history of breast cancer    BRCA testing letter sent  . Sinus complaint     Past Surgical History:  Procedure Laterality Date  . CESAREAN SECTION  1989  . COLONOSCOPY    . FOOT SURGERY Left 2013  . HERNIA REPAIR      There were no vitals filed for this visit.  Subjective Assessment - 09/26/18 0943    Subjective   Her right shoulder pain is hurting 7/10. She has some knee pain in the right is 5/10 . She has hives that started over the weekend. . Patient says that her knee gives away 2-3 times / week.     Pertinent History  Pt arrived at [facility] on 07/11/18 with complains of weakness in her right arm and leg while at work. She arrived via ambulance to [facility] and received tPA. MRI, MRA and CT all negative, echo normal. She was sent home on Atorvastatin and aspirin since discharge. She was supposed to have home PT but [place] declined her referral. Patient is still having weakness on right side of her body, tingling and has difficulty ambulating. She also states has pain. She is requiring assistance with bathing, cannot clean her house and mother or husband are driving her around.    Currently in Pain?  Yes    Pain Score  7      Pain Location  Shoulder    Pain Orientation  Right    Pain Descriptors / Indicators  Aching    Pain Onset  More than a month ago    Multiple Pain Sites  --   5/10 right knee        Treatment: Nu-step x 6 mins BUE and BLE 5 x sit to stand x 2 sets  Heel slides ( left side in pillowcase and on sliding board) x 20 , pain in left knee Hip abd/add x 20 , BLE hooklying marching, x 20 BLE Bridging x 20 SAQ x 20 , BLE Standing hip abd x 20 , BLE with left knee " popped "  Standing hip extension x 20 , BLE Heel raises x 20 BLE  Heat to right shoulder during rest periods Patient has pain behaviors with left knee and right shoulder. Waiting for  rollator prescription  due to right knee buckling   CGA and Min verbal cues used throughout for correct technique and form                      PT Education - 09/26/18 0946    Education Details  HEP  Person(s) Educated  Patient    Methods  Explanation    Comprehension  Verbalized understanding       PT Short Term Goals - 08/17/18 0953      PT SHORT TERM GOAL #1   Title  Patient will report a worst pain of 3/10 on VAS in  right UE and right LE and back to improve tolerance with ADLs and reduced symptoms with activities.     Time  4    Period  Weeks    Status  New    Target Date  09/14/18      PT SHORT TERM GOAL #2   Title  Patient will be able to perform household work/ chores without increase in symptoms.    Time  4    Period  Weeks    Status  New    Target Date  09/14/18        PT Long Term Goals - 08/17/18 0951      PT LONG TERM GOAL #1   Title  Patient will be independent in home exercise program to improve strength/mobility for better functional independence with ADLs    Time  8    Period  Weeks    Status  New    Target Date  10/12/18      PT LONG TERM GOAL #2   Title  Patient (< 57 years old) will complete five times sit to stand test in < 10 seconds indicating an increased LE strength and  improved balance.    Time  8    Period  Weeks    Status  New    Target Date  10/12/18      PT LONG TERM GOAL #3   Title  Patient will increase six minute walk test distance to >1000 for progression to community ambulator and improve gait ability    Time  8    Period  Weeks    Status  New    Target Date  10/12/18      PT LONG TERM GOAL #4   Title  Patient will reduce timed up and go to <11 seconds to reduce fall risk and demonstrate improved transfer/gait ability.    Time  8    Period  Weeks    Status  New    Target Date  10/12/18            Plan - 09/26/18 0947    Clinical Impression Statement  Patient is able to stand for 30 mins - 60 mins now for ADL's and then needs a rest period. PT is waiting for signed order for rollator so patient can have better saftey with right knee buckling during ambulation. Patient has pain in left knee 7/10 with exercise, and right knee feels like it could give away. She is able to perform LE exercises but is limited by pain. She will conintue to benefit from skilled PT to improve saftey and mobility    Rehab Potential  Good    PT Frequency  2x / week    PT Duration  8 weeks    PT Treatment/Interventions  Aquatic Therapy;Electrical Stimulation;Cryotherapy;Moist Heat;Gait training;Functional mobility training;Therapeutic exercise;Therapeutic activities;Balance training;Patient/family education;Neuromuscular re-education;Passive range of motion;Dry needling    PT Next Visit Plan  mobility training, gait training    PT Home Exercise Plan  R Hooklying SLR, L sidelying R clams, overhead pulleys for R shoulder AAROM    Consulted and Agree with Plan of Care  Patient  Patient will benefit from skilled therapeutic intervention in order to improve the following deficits and impairments:  Difficulty walking, Decreased mobility, Pain, Postural dysfunction, Impaired UE functional use, Impaired flexibility, Decreased strength, Decreased coordination,  Decreased activity tolerance, Obesity, Abnormal gait, Decreased balance  Visit Diagnosis: Muscle weakness (generalized)  Difficulty in walking, not elsewhere classified  Other abnormalities of gait and mobility     Problem List Patient Active Problem List   Diagnosis Date Noted  . Hemiparesis affecting right side as late effect of cerebrovascular accident (CVA) ([place]) 09/07/2018  . Imbalance 09/07/2018  . Lymphedema of both lower extremities 05/30/2018  . Moderate episode of recurrent major depressive disorder ([place]) 05/30/2018  . Pure hypercholesterolemia 05/30/2018  . Menorrhagia with regular cycle 10/12/2017  . Morbid obesity ([place]) 01/27/2017  . History of vitamin D deficiency 01/27/2017  . Fibroids 01/27/2017  . Asthma, well controlled 01/15/2014    Alanson Puls, [place] DPT 09/26/2018, 10:13 AM  [place] MAIN [facility] SERVICES [address] [place], [place], [address] Phone: [phone]   Fax:  [phone]  Name: Kaleesi Guyton MRN: [id number] Date of Birth: [birth year]

## 2018-09-28 ENCOUNTER — Ambulatory Visit: Payer: BLUE CROSS/BLUE SHIELD | Admitting: Physical Therapy

## 2018-09-28 ENCOUNTER — Encounter: Payer: BLUE CROSS/BLUE SHIELD | Admitting: Occupational Therapy

## 2018-09-28 ENCOUNTER — Encounter: Payer: Self-pay | Admitting: Physical Therapy

## 2018-09-28 DIAGNOSIS — M6281 Muscle weakness (generalized): Secondary | ICD-10-CM | POA: Diagnosis not present

## 2018-09-28 DIAGNOSIS — R262 Difficulty in walking, not elsewhere classified: Secondary | ICD-10-CM

## 2018-09-28 DIAGNOSIS — R2689 Other abnormalities of gait and mobility: Secondary | ICD-10-CM

## 2018-09-28 NOTE — Therapy (Signed)
[place] MAIN [facility] SERVICES [address] [place], [place], [address] Phone: [phone]   Fax:  [phone]  Physical Therapy Treatment  Patient Details  Name: [name] MRN: [id number] Date of Birth: 10-19-1970 Referring Provider (PT): sowles Drue Stager   Encounter Date: 09/28/2018  PT End of Session - 09/28/18 0942    Visit Number  8    Number of Visits  17    Date for PT Re-Evaluation  10/12/18    PT Start Time  0938    PT Stop Time  1016    PT Time Calculation (min)  38 min    Activity Tolerance  Patient tolerated treatment well;No increased pain    Behavior During Therapy  WFL for tasks assessed/performed       Past Medical History:  Diagnosis Date  . Asthma   . Difficulty breathing   . Family history of breast cancer    BRCA testing letter sent  . Sinus complaint     Past Surgical History:  Procedure Laterality Date  . CESAREAN SECTION  1989  . COLONOSCOPY    . FOOT SURGERY Left 2013  . HERNIA REPAIR      There were no vitals filed for this visit.  Subjective Assessment - 09/28/18 0941    Subjective   Her right shoulder pain is hurting 7/10. She has some knee pain in the right is 5/10 . She has hives that started over the weekend. . Patient says that her knee gives away 2-3 times / week.     Pertinent History  Pt arrived at [facility] on 07/11/18 with complains of weakness in her right arm and leg while at work. She arrived via ambulance to [facility] and received tPA. MRI, MRA and CT all negative, echo normal. She was sent home on Atorvastatin and aspirin since discharge. She was supposed to have home PT but [place] declined her referral. Patient is still having weakness on right side of her body, tingling and has difficulty ambulating. She also states has pain. She is requiring assistance with bathing, cannot clean her house and mother or husband are driving her around.    Currently in Pain?  Yes    Pain Score  7      Pain Location  Shoulder    Pain Orientation  Right    Pain Descriptors / Indicators  Aching    Pain Type  Chronic pain    Pain Onset  More than a month ago    Pain Frequency  Constant    Aggravating Factors   moving it    Pain Relieving Factors  rest    Effect of Pain on Daily Activities  difficult to perform    Multiple Pain Sites  --   5/10 right knee        Treatment: Nu-step x 5 mins BUE and BLE 5 x sit to stand x 2 sets  Heel slides ( left side in pillowcase and on sliding board) x 20, pain in left knee Hip abd/add x 20 , BLE hooklying marching, x 20 BLE Bridging x 20 SAQ x 20 , BLE Standing hip abd x 20 , BLE with left knee " popped "  Standing hip extension x 20 , BLE Heel raises x 20 BLE  Heat to right shoulder during rest periods Patient has pain behaviors with left knee and right shoulder. Waiting for  rollator prescription  due to right knee buckling  CGA and  Min verbal cues used throughout for correct technique and form                       PT Education - 09/28/18 0942    Education Details  HEP    Person(s) Educated  Patient    Methods  Explanation    Comprehension  Verbalized understanding;Returned demonstration;Need further instruction       PT Short Term Goals - 08/17/18 7253      PT SHORT TERM GOAL #1   Title  Patient will report a worst pain of 3/10 on VAS in  right UE and right LE and back to improve tolerance with ADLs and reduced symptoms with activities.     Time  4    Period  Weeks    Status  New    Target Date  09/14/18      PT SHORT TERM GOAL #2   Title  Patient will be able to perform household work/ chores without increase in symptoms.    Time  4    Period  Weeks    Status  New    Target Date  09/14/18        PT Long Term Goals - 08/17/18 0951      PT LONG TERM GOAL #1   Title  Patient will be independent in home exercise program to improve strength/mobility for better functional independence with ADLs     Time  8    Period  Weeks    Status  New    Target Date  10/12/18      PT LONG TERM GOAL #2   Title  Patient (< 51 years old) will complete five times sit to stand test in < 10 seconds indicating an increased LE strength and improved balance.    Time  8    Period  Weeks    Status  New    Target Date  10/12/18      PT LONG TERM GOAL #3   Title  Patient will increase six minute walk test distance to >1000 for progression to community ambulator and improve gait ability    Time  8    Period  Weeks    Status  New    Target Date  10/12/18      PT LONG TERM GOAL #4   Title  Patient will reduce timed up and go to <11 seconds to reduce fall risk and demonstrate improved transfer/gait ability.    Time  8    Period  Weeks    Status  New    Target Date  10/12/18            Plan - 09/28/18 0943    Clinical Impression Statement Patient is having right shoulder pain that is making exercise difficult due to needing to change positions during treatment and using heating pad for some shoulder pain relief.  She is limited in repetitions due to weakness in LE's and has difficulty with standing due to RLE pain increased in standing, and left knee pain that increases with standing.and during knee flex . Patient is able to stand for 30 mins - 60 mins now for ADL's and then needs a rest period. PT is waiting for signed order for rollator so patient can have better saftey with right knee buckling during ambulation. Patient has pain in left knee 7/10 with exercise, and right knee feels like it could give away. She is able to perform LE  exercises but is limited by pain. She will conintue to benefit from skilled PT to improve saftey and mobility    Rehab Potential  Good    PT Frequency  2x / week    PT Duration  8 weeks    PT Treatment/Interventions  Aquatic Therapy;Electrical Stimulation;Cryotherapy;Moist Heat;Gait training;Functional mobility training;Therapeutic exercise;Therapeutic  activities;Balance training;Patient/family education;Neuromuscular re-education;Passive range of motion;Dry needling    PT Next Visit Plan  mobility training, gait training    PT Home Exercise Plan  R Hooklying SLR, L sidelying R clams, overhead pulleys for R shoulder AAROM    Consulted and Agree with Plan of Care  Patient       Patient will benefit from skilled therapeutic intervention in order to improve the following deficits and impairments:  Difficulty walking, Decreased mobility, Pain, Postural dysfunction, Impaired UE functional use, Impaired flexibility, Decreased strength, Decreased coordination, Decreased activity tolerance, Obesity, Abnormal gait, Decreased balance  Visit Diagnosis: Muscle weakness (generalized)  Difficulty in walking, not elsewhere classified  Other abnormalities of gait and mobility     Problem List Patient Active Problem List   Diagnosis Date Noted  . Hemiparesis affecting right side as late effect of cerebrovascular accident (CVA) ([place]) 09/07/2018  . Imbalance 09/07/2018  . Lymphedema of both lower extremities 05/30/2018  . Moderate episode of recurrent major depressive disorder ([place]) 05/30/2018  . Pure hypercholesterolemia 05/30/2018  . Menorrhagia with regular cycle 10/12/2017  . Morbid obesity ([place]) 01/27/2017  . History of vitamin D deficiency 01/27/2017  . Fibroids 01/27/2017  . Asthma, well controlled 01/15/2014    Alanson Puls, [place] DPT 09/28/2018, 10:09 AM  [place] MAIN [facility] SERVICES [address] [place], [place], [address] Phone: [phone]   Fax:  [phone]  Name: [name] MRN: [id number] Date of Birth: 07-05-1971

## 2018-10-03 ENCOUNTER — Ambulatory Visit: Payer: BLUE CROSS/BLUE SHIELD | Admitting: Physical Therapy

## 2018-10-03 ENCOUNTER — Encounter: Payer: BLUE CROSS/BLUE SHIELD | Admitting: Occupational Therapy

## 2018-10-03 ENCOUNTER — Encounter: Payer: Self-pay | Admitting: Physical Therapy

## 2018-10-03 DIAGNOSIS — R2689 Other abnormalities of gait and mobility: Secondary | ICD-10-CM

## 2018-10-03 DIAGNOSIS — M6281 Muscle weakness (generalized): Secondary | ICD-10-CM

## 2018-10-03 DIAGNOSIS — R262 Difficulty in walking, not elsewhere classified: Secondary | ICD-10-CM

## 2018-10-03 NOTE — Therapy (Signed)
[place] MAIN [facility] SERVICES [address] [place], [place], [address] Phone: [phone]   Fax:  [phone]  Physical Therapy Treatment  Patient Details  Name: [name] MRN: [id number] Date of Birth: 1971-06-10 Referring Provider (PT): sowles Drue Stager   Encounter Date: 10/03/2018  PT End of Session - 10/03/18 0935    Visit Number  9    Number of Visits  17    Date for PT Re-Evaluation  10/12/18    PT Start Time  0930    PT Stop Time  1010    PT Time Calculation (min)  40 min    Activity Tolerance  Patient tolerated treatment well;No increased pain    Behavior During Therapy  WFL for tasks assessed/performed       Past Medical History:  Diagnosis Date  . Asthma   . Difficulty breathing   . Family history of breast cancer    BRCA testing letter sent  . Sinus complaint     Past Surgical History:  Procedure Laterality Date  . CESAREAN SECTION  1989  . COLONOSCOPY    . FOOT SURGERY Left 2013  . HERNIA REPAIR      There were no vitals filed for this visit.  Subjective Assessment - 10/03/18 1007    Subjective   Her right shoulder pain is hurting 7/10. She has some knee pain in the right is 5/10 . She has hives are still bothering her. The exercises and movement make her left shoulder worse. Patient says that her knee gives away 2-3 times / week.     Pertinent History  Pt arrived at [facility] on 07/11/18 with complains of weakness in her right arm and leg while at work. She arrived via ambulance to [facility] and received tPA. MRI, MRA and CT all negative, echo normal. She was sent home on Atorvastatin and aspirin since discharge. She was supposed to have home PT but [place] declined her referral. Patient is still having weakness on right side of her body, tingling and has difficulty ambulating. She also states has pain. She is requiring assistance with bathing, cannot clean her house and mother or husband are driving her  around.    Currently in Pain?  Yes    Pain Score  7     Pain Location  Shoulder    Pain Orientation  Right    Pain Descriptors / Indicators  Aching    Pain Type  Chronic pain    Pain Onset  More than a month ago    Multiple Pain Sites  --   right knee 5/10, left knee 7/10       Treatment: Nu-step x 5 mins BUE and BLE 5 x sit to stand x 2 sets Heel slides ( left side in pillowcase and on sliding board) x 20, pain in left knee Hip abd/add x 20, BLE hooklying marching, x 20 BLE Bridging x 20 SAQ x 20, BLE Standing hip abd x 20, BLE with left knee " popped " Standing hip extension x 20, BLE Heel raises x 20BLE  Heat to right shoulder during rest periods Patient has pain behaviors with left knee and right shoulder. Waiting forrollator prescriptiondue to right knee buckling CGA and Min verbal cues used throughoutfor correct technique and form                        PT Education - 10/03/18 3220  Education Details  HEP    Person(s) Educated  Patient    Methods  Explanation    Comprehension  Verbalized understanding       PT Short Term Goals - 08/17/18 0953      PT SHORT TERM GOAL #1   Title  Patient will report a worst pain of 3/10 on VAS in  right UE and right LE and back to improve tolerance with ADLs and reduced symptoms with activities.     Time  4    Period  Weeks    Status  New    Target Date  09/14/18      PT SHORT TERM GOAL #2   Title  Patient will be able to perform household work/ chores without increase in symptoms.    Time  4    Period  Weeks    Status  New    Target Date  09/14/18        PT Long Term Goals - 08/17/18 0951      PT LONG TERM GOAL #1   Title  Patient will be independent in home exercise program to improve strength/mobility for better functional independence with ADLs    Time  8    Period  Weeks    Status  New    Target Date  10/12/18      PT LONG TERM GOAL #2   Title  Patient (< 31 years old)  will complete five times sit to stand test in < 10 seconds indicating an increased LE strength and improved balance.    Time  8    Period  Weeks    Status  New    Target Date  10/12/18      PT LONG TERM GOAL #3   Title  Patient will increase six minute walk test distance to >1000 for progression to community ambulator and improve gait ability    Time  8    Period  Weeks    Status  New    Target Date  10/12/18      PT LONG TERM GOAL #4   Title  Patient will reduce timed up and go to <11 seconds to reduce fall risk and demonstrate improved transfer/gait ability.    Time  8    Period  Weeks    Status  New    Target Date  10/12/18            Plan - 10/03/18 0936    Clinical Impression Statement  Patient is having right shoulder pain that is making exercise difficult due to needing to change positions during treatment and using heating pad for some shoulder pain relief. She is limited in repetitions due to weakness and pain in LE's and has difficulty with standing due to RLE pain increased in standing, and left knee pain that increases with standing.and during knee flex . Patient is able to stand for 30 mins - 60 mins now for ADL's and then needs a rest period. PT is waiting for signed order for rollator so patient can have better saftey with right knee buckling during ambulation. Patient has pain in left knee 7/10 with exercise, and right knee feels like it could give away. She is able to perform LE exercises but is limited by pain. She will conintue to benefit from skilled PT to improve saftey and mobility    Rehab Potential  Good    PT Frequency  2x / week    PT Duration  8 weeks    PT Treatment/Interventions  Aquatic Therapy;Electrical Stimulation;Cryotherapy;Moist Heat;Gait training;Functional mobility training;Therapeutic exercise;Therapeutic activities;Balance training;Patient/family education;Neuromuscular re-education;Passive range of motion;Dry needling    PT Next Visit Plan   mobility training, gait training    PT Home Exercise Plan  R Hooklying SLR, L sidelying R clams, overhead pulleys for R shoulder AAROM    Consulted and Agree with Plan of Care  Patient       Patient will benefit from skilled therapeutic intervention in order to improve the following deficits and impairments:  Difficulty walking, Decreased mobility, Pain, Postural dysfunction, Impaired UE functional use, Impaired flexibility, Decreased strength, Decreased coordination, Decreased activity tolerance, Obesity, Abnormal gait, Decreased balance  Visit Diagnosis: Muscle weakness (generalized)  Difficulty in walking, not elsewhere classified  Other abnormalities of gait and mobility     Problem List Patient Active Problem List   Diagnosis Date Noted  . Hemiparesis affecting right side as late effect of cerebrovascular accident (CVA) ([place]) 09/07/2018  . Imbalance 09/07/2018  . Lymphedema of both lower extremities 05/30/2018  . Moderate episode of recurrent major depressive disorder ([place]) 05/30/2018  . Pure hypercholesterolemia 05/30/2018  . Menorrhagia with regular cycle 10/12/2017  . Morbid obesity ([place]) 01/27/2017  . History of vitamin D deficiency 01/27/2017  . Fibroids 01/27/2017  . Asthma, well controlled 01/15/2014    Alanson Puls, [place] DPT 10/03/2018, 10:11 AM  [place] MAIN [facility] SERVICES [address] [place], [place], [address] Phone: [phone]   Fax:  [phone]  Name: [name] MRN: [id number] Date of Birth: [birth year]

## 2018-10-05 ENCOUNTER — Encounter: Payer: BLUE CROSS/BLUE SHIELD | Admitting: Occupational Therapy

## 2018-10-05 ENCOUNTER — Ambulatory Visit: Payer: BLUE CROSS/BLUE SHIELD | Admitting: Physical Therapy

## 2018-10-05 ENCOUNTER — Encounter: Payer: Self-pay | Admitting: Physical Therapy

## 2018-10-05 DIAGNOSIS — M6281 Muscle weakness (generalized): Secondary | ICD-10-CM | POA: Diagnosis not present

## 2018-10-05 DIAGNOSIS — R2689 Other abnormalities of gait and mobility: Secondary | ICD-10-CM

## 2018-10-05 DIAGNOSIS — R262 Difficulty in walking, not elsewhere classified: Secondary | ICD-10-CM

## 2018-10-05 NOTE — Therapy (Addendum)
[place] MAIN [facility] SERVICES [address] [place], [place], [address] Phone: [phone]   Fax:  [phone]  Physical Therapy Treatment/ progress Note Physical Therapy Progress Note   Dates of reporting period  08/17/18   to  10/05/18  Patient Details  Name: [name] MRN: [id number] Date of Birth: [birth year] Referring Provider (PT): sowles Drue Stager   Encounter Date: 10/05/2018  PT End of Session - 10/05/18 0941    Visit Number  10    Number of Visits  17    Date for PT Re-Evaluation  12/08/18   PT Start Time  0935    PT Stop Time  6387    PT Time Calculation (min)  40 min    Activity Tolerance  Patient tolerated treatment well;No increased pain    Behavior During Therapy  WFL for tasks assessed/performed       Past Medical History:  Diagnosis Date  . Asthma   . Difficulty breathing   . Family history of breast cancer    BRCA testing letter sent  . Sinus complaint     Past Surgical History:  Procedure Laterality Date  . CESAREAN SECTION  1989  . COLONOSCOPY    . FOOT SURGERY Left 2013  . HERNIA REPAIR      There were no vitals filed for this visit.  Subjective Assessment - 10/05/18 0938    Subjective   Her right shoulder pain is hurting 8/10. She has some knee pain in the right is 5/10 . The exercises and movement make her left shoulder worse. Patient says that her knee gives away 2-3 times / week. She wants to try and not do any exercise today in lying down so that she does not make her right shoulder worse.     Pertinent History  Pt arrived at [facility] on 07/11/18 with complains of weakness in her right arm and leg while at work. She arrived via ambulance to [facility] and received tPA. MRI, MRA and CT all negative, echo normal. She was sent home on Atorvastatin and aspirin since discharge. She was supposed to have home PT but [place] declined her referral. Patient is still having weakness on right side of her body,  tingling and has difficulty ambulating. She also states has pain. She is requiring assistance with bathing, cannot clean her house and mother or husband are driving her around.    Currently in Pain?  Yes    Pain Score  8     Pain Location  Shoulder    Pain Orientation  Right    Pain Descriptors / Indicators  Aching    Pain Type  Chronic pain    Pain Onset  More than a month ago    Multiple Pain Sites  --   right knee is 5/10      Treatment Patiets goals were reassessed today for progress note Patient performed TUG, 5 x sit to stand, 6 MW test , 10 MW test  Therapeutic exercise: Nu-step left Ue and BLE with pain in right shoulder and right thigh and right knee  Stepping over hurdle x 20 fwd/ bwd x 20 x 2   Patient has increased pain in right shoulder, right thigh and left knee. She has episode of feeling like her right knee might give away .  She has made progress towards her goals including TUG, 10 MW test, 5 x sit to stand  But continues to have mobility deficits and  pain that cause her to be unable to stand or walk longer than several minutes without a rest period.       OUTCOME MEASURES:EVAL DATE  TEST Outcome Interpretation  5 times sit<>stand 35.67sec >11 yo, >15 sec indicates increased risk for falls  10 meter walk test    .17             m/s <1.0 m/s indicates increased risk for falls; limited community ambulator  Timed up and Go  29.92               sec <14 sec indicates increased risk for falls  6 minute walk test    NT           Feet 1000 feet is community ambulator      [address] Peg Test L:   28.11             R: 46.54    OUTCOME MEASURES: 10/05/18 TEST Outcome Interpretation  5 times sit<>stand 21.48 sec >60 yo, >15 sec indicates increased risk for falls  10 meter walk test    .56          m/s <1.0 m/s indicates increased risk for falls; limited community ambulator  Timed up and Go  19.55             sec <14 sec indicates increased risk for falls  6 minute walk  test       430          Feet 1000 feet is community ambulator                                PT Education - 10/05/18 0941    Education Details  HEP    Person(s) Educated  Patient    Methods  Explanation;Demonstration;Tactile cues    Comprehension  Verbalized understanding;Returned demonstration;Need further instruction       PT Short Term Goals - 10/05/18 9381      PT SHORT TERM GOAL #1   Title  Patient will report a worst pain of 3/10 on VAS in  right UE and right LE and back to improve tolerance with ADLs and reduced symptoms with activities.     Time  4    Period  Weeks    Status  Not Met    Target Date  09/14/18      PT SHORT TERM GOAL #2   Title  Patient will be able to perform household work/ chores without increase in symptoms.    Time  4    Period  Weeks    Status  Not Met    Target Date  09/14/18        PT Long Term Goals - 10/05/18 0942      PT LONG TERM GOAL #1   Title  Patient will be independent in home exercise program to improve strength/mobility for better functional independence with ADLs    Time  8    Period  Weeks    Status  Partially Met    Target Date  12/08/18      PT LONG TERM GOAL #2   Title  Patient (< 54 years old) will complete five times sit to stand test in < 10 seconds indicating an increased LE strength and improved balance.    Baseline  10/05/18=21.48    Time  8    Period  Weeks  Status  New    Target Date  12/08/18       PT LONG TERM GOAL #3   Title  Patient will increase six minute walk test distance to >1000 for progression to community ambulator and improve gait ability    Baseline  10/05/18=430 feet    Time  8    Period  Weeks    Status  Partially Met    Target Date 12/08/18      PT LONG TERM GOAL #4   Title  Patient will reduce timed up and go to <11 seconds to reduce fall risk and demonstrate improved transfer/gait ability.    Baseline  10/05/18=19.55    Time  8    Period  Weeks    Status   Partially Met    Target Date  12/08/18            Plan - 10/05/18 1008    Clinical impression  Patient's condition has the potential to improve in response to therapy. Maximum improvement is yet to be obtained. The anticipated improvement is attainable and reasonable in a generally predictable time.  Patient reports that she is continuing to have a lot of right shoulder, right thigh/knee , left knee pain. She can only stand and walk for several minutes before needing a rest period due to pain and weakness.. She has made progress in her goals and mobility is improving. She is borrowing her mothers RW at home to have more stability due to her right LE giving out. She would benefit from a RW and a referral for Dr. Montine Circle has been requested for the RW.  She will continue to benefit from skilled PT to improve mobility .    Rehab Potential  Good    PT Frequency  2x / week    PT Duration  8 weeks    PT Treatment/Interventions  Aquatic Therapy;Electrical Stimulation;Cryotherapy;Moist Heat;Gait training;Functional mobility training;Therapeutic exercise;Therapeutic activities;Balance training;Patient/family education;Neuromuscular re-education;Passive range of motion;Dry needling    PT Next Visit Plan  mobility training, gait training    PT Home Exercise Plan  R Hooklying SLR, L sidelying R clams, overhead pulleys for R shoulder AAROM    Consulted and Agree with Plan of Care  Patient       Patient will benefit from skilled therapeutic intervention in order to improve the following deficits and impairments:  Difficulty walking, Decreased mobility, Pain, Postural dysfunction, Impaired UE functional use, Impaired flexibility, Decreased strength, Decreased coordination, Decreased activity tolerance, Obesity, Abnormal gait, Decreased balance  Visit Diagnosis: Muscle weakness (generalized)  Difficulty in walking, not elsewhere classified  Other abnormalities of gait and mobility     Problem  List Patient Active Problem List   Diagnosis Date Noted  . Hemiparesis affecting right side as late effect of cerebrovascular accident (CVA) ([place]) 09/07/2018  . Imbalance 09/07/2018  . Lymphedema of both lower extremities 05/30/2018  . Moderate episode of recurrent major depressive disorder ([place]) 05/30/2018  . Pure hypercholesterolemia 05/30/2018  . Menorrhagia with regular cycle 10/12/2017  . Morbid obesity ([place]) 01/27/2017  . History of vitamin D deficiency 01/27/2017  . Fibroids 01/27/2017  . Asthma, well controlled 01/15/2014    Alanson Puls, [place] DPT 10/05/2018, 10:14 AM  [place] MAIN [facility] SERVICES [address] [place], [place], [address] Phone: [phone]   Fax:  [phone]  Name: [name] MRN: [id number] Date of Birth: 05/23/1971

## 2018-10-06 ENCOUNTER — Ambulatory Visit: Payer: BLUE CROSS/BLUE SHIELD | Admitting: Family Medicine

## 2018-10-06 ENCOUNTER — Encounter: Payer: Self-pay | Admitting: Family Medicine

## 2018-10-06 ENCOUNTER — Ambulatory Visit (INDEPENDENT_AMBULATORY_CARE_PROVIDER_SITE_OTHER): Payer: BLUE CROSS/BLUE SHIELD | Admitting: Family Medicine

## 2018-10-06 VITALS — BP 110/80 | HR 89 | Temp 98.0°F | Resp 16 | Ht 59.0 in | Wt 317.0 lb

## 2018-10-06 DIAGNOSIS — M25511 Pain in right shoulder: Secondary | ICD-10-CM | POA: Diagnosis not present

## 2018-10-06 DIAGNOSIS — F331 Major depressive disorder, recurrent, moderate: Secondary | ICD-10-CM

## 2018-10-06 DIAGNOSIS — I69351 Hemiplegia and hemiparesis following cerebral infarction affecting right dominant side: Secondary | ICD-10-CM

## 2018-10-06 NOTE — Progress Notes (Signed)
Name: [name]   MRN: [id number]    DOB: 1971-04-06   Date:10/06/2018       Progress Note  Subjective  Chief Complaint  Chief Complaint  Patient presents with  . Follow-up    1 month F/U  . Depression    Never started any medication states she is fine without it   . Obesity    Going to pick up Belviq today had to wait on saving card to be ran  . Shoulder Pain    Still seeing Therapist for Right shoulder pain    HPI  CVA : seen by Dr. Melrose Nakayama , neurologist on 09/07/2018, he reviewed her imaging studies and explained that likely not CVA but a TIA and symptoms should resolve within weeks. She had tingling numbness and weakness on right side. Patient and husband are very upset stating that she is getting PT and cannot go back to work. Discussed that based on Dr. Lannie Fields note her symptoms do not match the scan. She is still having PT and has pain on right shoulder and inability to abduct and internally rotate her right shoulder, she also states her right leg buckles . Seeing Ortho since fall on 08/08/2018. Explained that she will likely need another claim for the reason she is out of work. Patient got very upset and also her husband that states she is not ready to go back to work. Explained that the reason I filled FMLA was for CVA, however with Dr. Lannie Fields notes it is unlikely her disability insurance will continue to cover her time off work for TIA. Explained that she may need another FMLA filled out by Dr. Rudene Christians to allow her to stay off work secondary to shoulder pain. She is aware that I will reach out to both providers so we can finalize her return to work papers. Based on today's visit I recommend going back as previously recommended on March 9th and get extension based on shoulder symptoms.   I will ask Dr. Melrose Nakayama to see her sooner to be able to decide when patient is ready to return to work, also explained that I will need input from Dr. Rudene Christians to find out when she can be ready to  return to work   Major Depression: she has a long history of depression , high phq9 but states currently depressed because of aches and pains and inability to work. Explained that medication may help her but she refuses  Morbid obesity: her weight is going up, she is taking prednisone, but also not as active. Currently not working. Discussed importance of portion control and healthy diet    Patient Active Problem List   Diagnosis Date Noted  . Hemiparesis affecting right side as late effect of cerebrovascular accident (CVA) ([place]) 09/07/2018  . Imbalance 09/07/2018  . Lymphedema of both lower extremities 05/30/2018  . Moderate episode of recurrent major depressive disorder ([place]) 05/30/2018  . Pure hypercholesterolemia 05/30/2018  . Menorrhagia with regular cycle 10/12/2017  . Morbid obesity ([place]) 01/27/2017  . History of vitamin D deficiency 01/27/2017  . Fibroids 01/27/2017  . Asthma, well controlled 01/15/2014    Past Surgical History:  Procedure Laterality Date  . CESAREAN SECTION  1989  . COLONOSCOPY    . FOOT SURGERY Left 2013  . HERNIA REPAIR      Family History  Problem Relation Age of Onset  . Diabetes Mother   . Uterine cancer Mother   . Colon cancer Mother   . Cancer -  Lung Father   . Colon cancer Father   . Diverticulitis Brother   . Hernia Brother        x2 surgeries  . Colon cancer Paternal Grandmother   . Lung cancer Paternal Grandmother     Social History   Socioeconomic History  . Marital status: Married    Spouse name: Stage manager  . Number of children: 1  . Years of education: Not on file  . Highest education level: Some college, no degree  Occupational History  . Occupation: customer services   Social Needs  . Financial resource strain: Not hard at all  . Food insecurity:    Worry: Never true    Inability: Never true  . Transportation needs:    Medical: No    Non-medical: No  Tobacco Use  . Smoking status: Never Smoker  . Smokeless  tobacco: Never Used  Substance and Sexual Activity  . Alcohol use: Yes    Comment: occasionally  . Drug use: No  . Sexual activity: Yes    Partners: Male    Birth control/protection: Pill  Lifestyle  . Physical activity:    Days per week: 5 days    Minutes per session: 30 min  . Stress: Only a little  Relationships  . Social connections:    Talks on phone: More than three times a week    Gets together: Twice a week    Attends religious service: More than 4 times per year    Active member of club or organization: No    Attends meetings of clubs or organizations: Never    Relationship status: Married  . Intimate partner violence:    Fear of current or ex partner: No    Emotionally abused: No    Physically abused: No    Forced sexual activity: No  Other Topics Concern  . Not on file  Social History Narrative   Married with her high school sweet heart , they had first child at age 39, they recently got married ( on their daughter's birthday 2019), she two miscarriages     Current Outpatient Medications:  .  Albuterol Sulfate (VENTOLIN HFA IN), Inhale into the lungs as needed (for shortness of breath). , Disp: , Rfl:  .  aspirin 81 MG chewable tablet, Chew 1 tablet (81 mg total) by mouth daily., Disp: 30 tablet, Rfl: 0 .  atorvastatin (LIPITOR) 40 MG tablet, Take 1 tablet by mouth daily., Disp: , Rfl:  .  Cinnamon 500 MG TABS, Take 2 tablets by mouth daily., Disp: , Rfl:  .  fluconazole (DIFLUCAN) 150 MG tablet, Take 1 tablet (150 mg total) by mouth every other day., Disp: 3 tablet, Rfl: 0 .  hydrOXYzine (ATARAX/VISTARIL) 25 MG tablet, TAKE 1 TABLET BY MOUTH THREE TIMES DAILY AS NEEDED FOR ITCHING, Disp: , Rfl:  .  JENCYCLA 0.35 MG tablet, , Disp: , Rfl:  .  Lorcaserin HCl ER (BELVIQ XR) 20 MG TB24, Take 1 tablet by mouth daily., Disp: 30 tablet, Rfl: 2 .  mometasone-formoterol (DULERA) 200-5 MCG/ACT AERO, Inhale 2 puffs into the lungs 2 (two) times daily., Disp: , Rfl:  .   montelukast (SINGULAIR) 10 MG tablet, Take 1 tablet by mouth daily., Disp: , Rfl:  .  Multiple Vitamins-Calcium (ONE-A-DAY WITHIN PO), Take 1 tablet by mouth daily., Disp: , Rfl:  .  Omega 3 1000 MG CAPS, Take 1 capsule by mouth daily., Disp: , Rfl:  .  predniSONE (DELTASONE) 10 MG tablet, Prednisone  10 mg, 4 pills q day x 2 days, 3 pills q day x 2 days, 2 pills q day x 2 days, 1 pill q day x 2 days., Disp: , Rfl:  .  Tiotropium Bromide Monohydrate (SPIRIVA RESPIMAT) 1.25 MCG/ACT AERS, Inhale into the lungs., Disp: , Rfl:  .  traMADol (ULTRAM) 50 MG tablet, Take 50 mg by mouth every 8 (eight) hours as needed. for pain, Disp: , Rfl:  .  triamcinolone cream (KENALOG) 0.1 %, Apply topically., Disp: , Rfl:  .  triamcinolone cream (KENALOG) 0.1 %, , Disp: , Rfl:  .  vitamin B-12 (CYANOCOBALAMIN) 1000 MCG tablet, Take 1,000 mcg by mouth daily., Disp: , Rfl:  .  Vitamin D, Ergocalciferol, (DRISDOL) 1.25 MG (50000 UT) CAPS capsule, Take 1 capsule (50,000 Units total) by mouth every 7 (seven) days. For 3 months., Disp: 4 capsule, Rfl: 2 .  escitalopram (LEXAPRO) 5 MG tablet, Take 1 tablet (5 mg total) by mouth daily. (Patient not taking: Reported on 10/06/2018), Disp: 30 tablet, Rfl: 0  Allergies  Allergen Reactions  . Advil [Ibuprofen] Other (See Comments)    Other Reaction: Other reaction-swells  . Percocet [Oxycodone-Acetaminophen] Nausea And Vomiting and Other (See Comments)    headaches  . Azithromycin Rash    PATIENT UNSURE IF RASH WAS ACTUALLY DUE TO ABX     I personally reviewed active problem list, medication list, allergies, family history, social history with the patient/caregiver today.   ROS  Constitutional: Negative for fever, positive for  weight change.  Respiratory: Negative for cough and shortness of breath.   Cardiovascular: Negative for chest pain or palpitations.  Gastrointestinal: Negative for abdominal pain, no bowel changes.  Musculoskeletal: Positive  for gait problem  but no joint swelling.  Skin: Negative for rash.  Neurological: Negative for dizziness or headache.  No other specific complaints in a complete review of systems (except as listed in HPI above).  Objective  Vitals:   10/06/18 1052  BP: 110/80  Pulse: 89  Resp: 16  Temp: 98 F (36.7 C)  TempSrc: Oral  SpO2: 96%  Weight: (!) 317 lb (143.8 kg)  Height: 4\' 11"  (1.499 m)    Body mass index is 64.03 kg/m.  Physical Exam  Constitutional: Patient appears well-developed and well-nourished. Obese  No distress.  HEENT: head atraumatic, normocephalic, pupils equal and reactive to light,  neck supple, throat within normal limits Cardiovascular: Normal rate, regular rhythm and normal heart sounds.  No murmur heard. No BLE edema. Pulmonary/Chest: Effort normal and breath sounds normal. No respiratory distress. Abdominal: Soft.  There is no tenderness. Muscular Skeletal: pain on anterior knee, walks slowly , favoring left side  Neurological: normal grip and sensation on both arms and leg Psychiatric: Patient has a normal mood and affect. behavior is normal. Judgment and thought content normal.  Recent Results (from the past 2160 hour(s))  Glucose, capillary     Status: None   Collection Time: 07/11/18  1:05 PM  Result Value Ref Range   Glucose-Capillary 87 70 - 99 mg/dL  MRSA PCR Screening     Status: None   Collection Time: 07/11/18  5:20 PM  Result Value Ref Range   MRSA by PCR NEGATIVE NEGATIVE    Comment:        The GeneXpert MRSA Assay (FDA approved for NASAL specimens only), is one component of a comprehensive MRSA colonization surveillance program. It is not intended to diagnose MRSA infection nor to guide or monitor treatment for MRSA  infections. Performed at [facility], [address] [place], [place] [address]   ECHOCARDIOGRAM COMPLETE     Status: None   Collection Time: 07/12/18 12:30 PM  Result Value Ref Range   Weight 4,800 oz   Height 59 in   BP  100/63 mmHg  Ethanol     Status: None   Collection Time: 07/12/18  8:57 PM  Result Value Ref Range   Alcohol, Ethyl (B) <10 <10 mg/dL    Comment: (NOTE) Lowest detectable limit for serum alcohol is 10 mg/dL. For medical purposes only. Performed at [facility], [address] [place], [place] [address]   Protime-INR     Status: None   Collection Time: 07/12/18  8:57 PM  Result Value Ref Range   Prothrombin Time 14.5 11.4 - 15.2 seconds   INR 1.14     Comment: Performed at [facility], [address] [place], [place] [address]  APTT     Status: None   Collection Time: 07/12/18  8:57 PM  Result Value Ref Range   aPTT 31 24 - 36 seconds    Comment: Performed at [facility], [address] [place], [place] [address]  CBC     Status: Abnormal   Collection Time: 07/12/18  8:57 PM  Result Value Ref Range   WBC 13.0 (H) 4.0 - 10.5 K/uL   RBC 4.42 3.87 - 5.11 MIL/uL   Hemoglobin 12.8 12.0 - 15.0 g/dL   HCT 39.3 36.0 - 46.0 %   MCV 88.9 80.0 - 100.0 fL   MCH 29.0 26.0 - 34.0 pg   MCHC 32.6 30.0 - 36.0 g/dL   RDW 14.1 11.5 - 15.5 %   Platelets 441 (H) 150 - 400 K/uL   nRBC 0.0 0.0 - 0.2 %    Comment: Performed at [facility], [address] [place], [place] [address]  Differential     Status: Abnormal   Collection Time: 07/12/18  8:57 PM  Result Value Ref Range   Neutrophils Relative % 59 %   Neutro Abs 7.8 (H) 1.7 - 7.7 K/uL   Lymphocytes Relative 27 %   Lymphs Abs 3.5 0.7 - 4.0 K/uL   Monocytes Relative 5 %   Monocytes Absolute 0.7 0.1 - 1.0 K/uL   Eosinophils Relative 7 %   Eosinophils Absolute 0.9 (H) 0.0 - 0.5 K/uL   Basophils Relative 1 %   Basophils Absolute 0.1 0.0 - 0.1 K/uL   Immature Granulocytes 1 %   Abs Immature Granulocytes 0.07 0.00 - 0.07 K/uL    Comment: Performed at [facility], [address] [place], [place] [address]  Comprehensive metabolic panel     Status: Abnormal   Collection Time: 07/12/18   8:57 PM  Result Value Ref Range   Sodium 137 135 - 145 mmol/L   Potassium 4.1 3.5 - 5.1 mmol/L   Chloride 107 98 - 111 mmol/L   CO2 21 (L) 22 - 32 mmol/L   Glucose, Bld 128 (H) 70 - 99 mg/dL   BUN 17 6 - 20 mg/dL   Creatinine, Ser 0.85 0.44 - 1.00 mg/dL   Calcium 8.6 (L) 8.9 - 10.3 mg/dL   Total Protein 6.5 6.5 - 8.1 g/dL   Albumin 3.3 (L) 3.5 - 5.0 g/dL   AST 19 15 - 41 U/L   ALT 12 0 - 44 U/L   Alkaline Phosphatase 68 38 - 126 U/L   Total Bilirubin 0.3 0.3 -  1.2 mg/dL   GFR calc non Af Amer >60 >60 mL/min   GFR calc Af Amer >60 >60 mL/min   Anion gap 9 5 - 15    Comment: Performed at [facility], [address] [place], [place] [address]  Troponin I - ONCE - STAT     Status: None   Collection Time: 07/12/18  8:57 PM  Result Value Ref Range   Troponin I <0.03 <0.03 ng/mL    Comment: Performed at [facility], [address] [place], [place] [address]  HIV antibody (Routine Testing)     Status: None   Collection Time: 07/12/18  8:57 PM  Result Value Ref Range   HIV Screen 4th Generation wRfx Non Reactive Non Reactive    Comment: (NOTE) Performed At: [facility] [place], [place] [id number] Rush Farmer MD SE:[id number]   Triglycerides     Status: None   Collection Time: 07/12/18  8:57 PM  Result Value Ref Range   Triglycerides 82 <150 mg/dL    Comment: Performed at [facility], [address] [place], [place] [address]  Urine Drug Screen, Qualitative     Status: None   Collection Time: 07/13/18  3:53 AM  Result Value Ref Range   Tricyclic, Ur Screen NONE DETECTED NONE DETECTED   Amphetamines, Ur Screen NONE DETECTED NONE DETECTED   MDMA (Ecstasy)Ur Screen NONE DETECTED NONE DETECTED   Cocaine Metabolite,Ur [place] NONE DETECTED NONE DETECTED   Opiate, Ur Screen NONE DETECTED NONE DETECTED   Phencyclidine (PCP) Ur S NONE DETECTED NONE DETECTED   Cannabinoid 50 Ng, Ur [place] NONE DETECTED NONE DETECTED   Barbiturates,  Ur Screen NONE DETECTED NONE DETECTED   Benzodiazepine, Ur Scrn NONE DETECTED NONE DETECTED   Methadone Scn, Ur NONE DETECTED NONE DETECTED    Comment: (NOTE) Tricyclics + metabolites, urine    Cutoff 1000 ng/mL Amphetamines + metabolites, urine  Cutoff 1000 ng/mL MDMA (Ecstasy), urine              Cutoff 500 ng/mL Cocaine Metabolite, urine          Cutoff 300 ng/mL Opiate + metabolites, urine        Cutoff 300 ng/mL Phencyclidine (PCP), urine         Cutoff 25 ng/mL Cannabinoid, urine                 Cutoff 50 ng/mL Barbiturates + metabolites, urine  Cutoff 200 ng/mL Benzodiazepine, urine              Cutoff 200 ng/mL Methadone, urine                   Cutoff 300 ng/mL The urine drug screen provides only a preliminary, unconfirmed analytical test result and should not be used for non-medical purposes. Clinical consideration and professional judgment should be applied to any positive drug screen result due to possible interfering substances. A more specific alternate chemical method must be used in order to obtain a confirmed analytical result. Gas chromatography / mass spectrometry (GC/MS) is the preferred confirmat ory method. Performed at [facility], [address] [place],  [address]   Urinalysis, Routine w reflex microscopic     Status: Abnormal   Collection Time: 07/13/18  3:53 AM  Result Value Ref Range   Color, Urine STRAW (A) YELLOW   APPearance CLEAR (A) CLEAR   Specific Gravity, Urine 1.005 1.005 - 1.030   pH 6.0 5.0 -  8.0   Glucose, UA NEGATIVE NEGATIVE mg/dL   Hgb urine dipstick SMALL (A) NEGATIVE   Bilirubin Urine NEGATIVE NEGATIVE   Ketones, ur NEGATIVE NEGATIVE mg/dL   Protein, ur NEGATIVE NEGATIVE mg/dL   Nitrite NEGATIVE NEGATIVE   Leukocytes, UA NEGATIVE NEGATIVE   RBC / HPF 0-5 0 - 5 RBC/hpf   WBC, UA 0-5 0 - 5 WBC/hpf   Bacteria, UA RARE (A) NONE SEEN   Squamous Epithelial / LPF 0-5 0 - 5    Comment: Performed at [facility], [address] [place], [place] [address]  CBC with Differential     Status: Abnormal   Collection Time: 09/06/18  2:08 AM  Result Value Ref Range   WBC 17.1 (H) 4.0 - 10.5 K/uL   RBC 4.63 3.87 - 5.11 MIL/uL   Hemoglobin 13.2 12.0 - 15.0 g/dL   HCT 40.4 36.0 - 46.0 %   MCV 87.3 80.0 - 100.0 fL   MCH 28.5 26.0 - 34.0 pg   MCHC 32.7 30.0 - 36.0 g/dL   RDW 14.3 11.5 - 15.5 %   Platelets 465 (H) 150 - 400 K/uL   nRBC 0.0 0.0 - 0.2 %   Neutrophils Relative % 62 %   Neutro Abs 10.7 (H) 1.7 - 7.7 K/uL   Lymphocytes Relative 27 %   Lymphs Abs 4.6 (H) 0.7 - 4.0 K/uL   Monocytes Relative 5 %   Monocytes Absolute 0.8 0.1 - 1.0 K/uL   Eosinophils Relative 5 %   Eosinophils Absolute 0.9 (H) 0.0 - 0.5 K/uL   Basophils Relative 1 %   Basophils Absolute 0.1 0.0 - 0.1 K/uL   Immature Granulocytes 0 %   Abs Immature Granulocytes 0.07 0.00 - 0.07 K/uL    Comment: Performed at [facility], [address] [place], [place] [address]  Comprehensive metabolic panel     Status: Abnormal   Collection Time: 09/06/18  2:08 AM  Result Value Ref Range   Sodium 140 135 - 145 mmol/L   Potassium 3.4 (L) 3.5 - 5.1 mmol/L   Chloride 106 98 - 111 mmol/L   CO2 27 22 - 32 mmol/L   Glucose, Bld 107 (H) 70 - 99 mg/dL   BUN 14 6 - 20 mg/dL   Creatinine, Ser 0.78 0.44 - 1.00 mg/dL   Calcium 8.3 (L) 8.9 - 10.3 mg/dL   Total Protein 7.2 6.5 - 8.1 g/dL   Albumin 3.3 (L) 3.5 - 5.0 g/dL   AST 21 15 - 41 U/L   ALT 20 0 - 44 U/L   Alkaline Phosphatase 85 38 - 126 U/L   Total Bilirubin 0.4 0.3 - 1.2 mg/dL   GFR calc non Af Amer >60 >60 mL/min   GFR calc Af Amer >60 >60 mL/min   Anion gap 7 5 - 15    Comment: Performed at [facility], [address] [place], [place] [address]  Lactic acid, plasma     Status: None   Collection Time: 09/06/18  2:08 AM  Result Value Ref Range   Lactic Acid, Venous 0.9 0.5 - 1.9 mmol/L    Comment: Performed at [facility], [address]  [place],  [address]  Wound culture     Status: None   Collection Time: 09/07/18 12:23 PM  Result Value Ref Range   MICRO NUMBER: [id number]    SPECIMEN QUALITY: Adequate    SOURCE: ABSCESS, FACIAL    STATUS: FINAL  GRAM STAIN: No organisms or white blood cells seen    RESULT: No Growth   CBC with Differential     Status: Abnormal   Collection Time: 09/07/18  3:44 PM  Result Value Ref Range   WBC 14.7 (H) 4.0 - 10.5 K/uL   RBC 4.65 3.87 - 5.11 MIL/uL   Hemoglobin 13.2 12.0 - 15.0 g/dL   HCT 41.3 36.0 - 46.0 %   MCV 88.8 80.0 - 100.0 fL   MCH 28.4 26.0 - 34.0 pg   MCHC 32.0 30.0 - 36.0 g/dL   RDW 14.3 11.5 - 15.5 %   Platelets 466 (H) 150 - 400 K/uL   nRBC 0.0 0.0 - 0.2 %   Neutrophils Relative % 65 %   Neutro Abs 9.5 (H) 1.7 - 7.7 K/uL   Lymphocytes Relative 25 %   Lymphs Abs 3.7 0.7 - 4.0 K/uL   Monocytes Relative 4 %   Monocytes Absolute 0.6 0.1 - 1.0 K/uL   Eosinophils Relative 5 %   Eosinophils Absolute 0.8 (H) 0.0 - 0.5 K/uL   Basophils Relative 1 %   Basophils Absolute 0.1 0.0 - 0.1 K/uL   Immature Granulocytes 0 %   Abs Immature Granulocytes 0.04 0.00 - 0.07 K/uL    Comment: Performed at [facility], [address] [place], [place] [address]  Comprehensive metabolic panel     Status: Abnormal   Collection Time: 09/07/18  3:44 PM  Result Value Ref Range   Sodium 141 135 - 145 mmol/L   Potassium 3.6 3.5 - 5.1 mmol/L   Chloride 105 98 - 111 mmol/L   CO2 29 22 - 32 mmol/L   Glucose, Bld 99 70 - 99 mg/dL   BUN 10 6 - 20 mg/dL   Creatinine, Ser 0.84 0.44 - 1.00 mg/dL   Calcium 8.7 (L) 8.9 - 10.3 mg/dL   Total Protein 7.3 6.5 - 8.1 g/dL   Albumin 3.4 (L) 3.5 - 5.0 g/dL   AST 18 15 - 41 U/L   ALT 17 0 - 44 U/L   Alkaline Phosphatase 87 38 - 126 U/L   Total Bilirubin 0.4 0.3 - 1.2 mg/dL   GFR calc non Af Amer >60 >60 mL/min   GFR calc Af Amer >60 >60 mL/min   Anion gap 7 5 - 15    Comment: Performed at [facility], [address],  [place], [place] [address]     PHQ2/9: Depression screen [facility] 2/9 10/06/2018 09/13/2018 09/07/2018 07/18/2018 05/30/2018  Decreased Interest 1 3 3  0 3  Down, Depressed, Hopeless 1 1 1  0 2  PHQ - 2 Score 2 4 4  0 5  Altered sleeping 3 1 1 2 2   Tired, decreased energy 3 1 1 2 2   Change in appetite 3 3 3  0 2  Feeling bad or failure about yourself  1 2 2  0 2  Trouble concentrating 1 2 2  0 0  Moving slowly or fidgety/restless 1 - 2 0 0  Suicidal thoughts 0 1 1 0 1  PHQ-9 Score 14 14 16 4 14   Difficult doing work/chores Somewhat difficult Somewhat difficult Somewhat difficult Not difficult at all Somewhat difficult     Fall Risk: Fall Risk  09/07/2018 07/18/2018 05/30/2018  Falls in the past year? 1 0 No  Comment August 08, 2018 - -  Number falls in past yr: 0 0 -  Injury with Fall? 1 0 -  Comment right fracture clavicle  - -  Risk for fall due to : Other (Comment) - -  Risk for fall due to: Comment syncopy episode - -     Functional Status Survey: Is the patient deaf or have difficulty hearing?: No Does the patient have difficulty seeing, even when wearing glasses/contacts?: Yes Does the patient have difficulty concentrating, remembering, or making decisions?: No Does the patient have difficulty walking or climbing stairs?: No Does the patient have difficulty dressing or bathing?: Yes Does the patient have difficulty doing errands alone such as visiting a doctor's office or shopping?: Yes    Assessment & Plan  1. Hemiparesis affecting right side as late effect of cerebrovascular accident (CVA) ([place])  Sending not to Dr. Melrose Nakayama, on his last visit he stated TIA , need to know when okay to release her back to work   2. Morbid obesity ([place])  Discussed with the patient the risk posed by an increased BMI. Discussed importance of portion control, calorie counting and at least 150 minutes of physical activity weekly. Avoid sweet beverages and drink more water. Eat at least 6 servings of fruit  and vegetables daily  Had hives and is on prednisone   3. Moderate episode of recurrent major depressive disorder ([place])  She did not fill rx, , phq 9 is still high, she states she feels down because of pain, but does not want to go on medication   4. Acute pain of right shoulder  Getting PT, and seeing Ortho, finishing therapy end of March

## 2018-10-06 NOTE — Patient Instructions (Signed)
Trelegy ( combined medication)

## 2018-10-11 ENCOUNTER — Ambulatory Visit: Payer: BLUE CROSS/BLUE SHIELD | Admitting: Physical Therapy

## 2018-10-12 ENCOUNTER — Telehealth: Payer: Self-pay

## 2018-10-12 NOTE — Telephone Encounter (Signed)
Spoke with patient and she states Dr. Melrose Nakayama told her he wanted her to performed the functional capacity evaluation (FCE) in [place] but they are booked out for 2 weeks. She would like to see what Dr. Ancil Boozer wants her to do?Marland Kitchen She has a follow up with Dr. Melrose Nakayama on November 05, 2018 and has therapy scheduled until March 24.   Do you still want her to return March 8?

## 2018-10-12 NOTE — Telephone Encounter (Signed)
Copied from [place] [phone]. Topic: General - Other >> Oct 11, 2018  9:00 AM Carolyn Stare wrote:  Pt would like a call back form Dr Ancil Boozer nurse about her return to work date >> Oct 12, 2018  9:58 AM Selinda Flavin B, NT wrote: Patient calling to inquire about a phone call from Dr Ancil Boozer nurse. Please advise.

## 2018-10-12 NOTE — Telephone Encounter (Signed)
We can extend time off work until functional capacity evaluation is done

## 2018-10-13 ENCOUNTER — Ambulatory Visit: Payer: BLUE CROSS/BLUE SHIELD | Admitting: Physical Therapy

## 2018-10-13 NOTE — Telephone Encounter (Signed)
Patient would like Tiffany to call her regarding her FMLA paperwork [phone]

## 2018-10-13 NOTE — Telephone Encounter (Signed)
Patient had her Neurologist appointment mixed up- it was for April 27 th and they moved Dr. Melrose Nakayama appt up to April 7th. Functional capacity evaluation (FCE) has not been scheduled yet but they are suppose to call the patient back soon to schedule it. She will call us once she gets the final date.

## 2018-10-14 NOTE — Telephone Encounter (Signed)
Pt requesting a call from [place] about her neurology appt and her FMLA papers.

## 2018-10-14 NOTE — Telephone Encounter (Signed)
Spoke with patient and she states PT only works Mon-Thursday and she is waiting on Dr. Melrose Nakayama to put the referral in but their office closes early on Fridays. She will call us back Monday with more information.

## 2018-10-17 ENCOUNTER — Telehealth: Payer: Self-pay | Admitting: Family Medicine

## 2018-10-17 NOTE — Telephone Encounter (Signed)
Copied from [place] [phone]. Topic: Quick Communication - See Telephone Encounter >> Oct 17, 2018  8:29 AM Robina Ade, Helene Kelp D wrote: CRM for notification. See Telephone encounter for: 10/17/18. Patient called and would like to let Dr. Ancil Boozer know that she has a test at neurology 11/04/18 @ 9am. She would also like to talk to her CMA. Please call patient back, thanks.

## 2018-10-18 ENCOUNTER — Telehealth: Payer: Self-pay | Admitting: Family Medicine

## 2018-10-18 ENCOUNTER — Ambulatory Visit: Payer: BLUE CROSS/BLUE SHIELD | Admitting: Physical Therapy

## 2018-10-18 NOTE — Addendum Note (Signed)
Addended by: Alanson Puls on: 10/18/2018 11:04 AM   Modules accepted: Orders

## 2018-10-18 NOTE — Telephone Encounter (Signed)
Routing to correct office. Please advise.

## 2018-10-18 NOTE — Telephone Encounter (Signed)
Copied from [place] #[id number]. Topic: General - Other >> Oct 18, 2018  8:00 AM Keene Breath wrote: Reason for CRM: Patient called to speak with the nurse regarding her FMLA paperwork.  Please advise and call back at [phone]

## 2018-10-18 NOTE — Telephone Encounter (Signed)
Can you please contact her. Thank you

## 2018-10-19 NOTE — Telephone Encounter (Signed)
Spoke with Dr. Melrose Nakayama, CMA- [facility]-She seen on January 29 and returning to see him November 15, 2018. She has an appointment with [facility] for her functional capacity test on March 27 th at 9 a.m.. I will fax it to their office [phone]) for them to fill out during her appointment since it is due March 28 to [place] [phone]). However she needs a whole new FMLA filled out due to extending her time off until the evaluation.  Patient is going to call [employer] and have them send a new form to our office.    Patient also wanted to inquire about Belviq since it was recalled if Dr. Ancil Boozer could send in something similar to it.

## 2018-10-19 NOTE — Telephone Encounter (Signed)
Pt calling back to check status.

## 2018-10-19 NOTE — Telephone Encounter (Signed)
There is nothing equivalent to Belviq - we will need a separate visit to discuss options again Can you please ask Dr. Melrose Nakayama to call me on my cell number?  We may need to extend time off by repeating information from previous form and adding date of PT evaluation and Dr. Lannie Fields follow up Thank you

## 2018-10-19 NOTE — Telephone Encounter (Signed)
Patient calling to let Dr Ancil Boozer know that she is getting her FMLA paper re-faxed over to the office.

## 2018-10-20 ENCOUNTER — Encounter: Payer: Self-pay | Admitting: Family Medicine

## 2018-10-20 ENCOUNTER — Ambulatory Visit: Payer: BLUE CROSS/BLUE SHIELD | Admitting: Physical Therapy

## 2018-10-20 NOTE — Telephone Encounter (Signed)
Patient was notified she has to come in to discuss other options for weight loss medication. She states she will when she comes up to pick up her paperwork.   Also I called Dr. Melrose Nakayama office but they are closed from 12-1:30 for lunch.

## 2018-10-20 NOTE — Telephone Encounter (Signed)
Letter was written patient has to remain out of work until her functional capacity evaluation is performed which is on November 04, 2018. Patient was notified it was faxed in to [facility] along with Dr. Ancil Boozer last note. Patient is going to come by and get a copy of this documentation for work so she is able to get paid.

## 2018-10-20 NOTE — Telephone Encounter (Unsigned)
Copied from [place] [id number]. Topic: General - Other >> Oct 20, 2018 11:21 AM Ivar Drape wrote: Reason for CRM:   [place] faxed the patient's FMLA paperwork to the practice yesterday.  She would like a call back to discuss.

## 2018-10-24 ENCOUNTER — Ambulatory Visit: Payer: BLUE CROSS/BLUE SHIELD | Admitting: Physical Therapy

## 2018-10-26 ENCOUNTER — Ambulatory Visit: Payer: BLUE CROSS/BLUE SHIELD | Admitting: Physical Therapy

## 2018-10-27 ENCOUNTER — Telehealth: Payer: Self-pay | Admitting: Family Medicine

## 2018-10-27 NOTE — Telephone Encounter (Signed)
Copied from [place] [id number]. Topic: Quick Communication - See Telephone Encounter >> Oct 27, 2018  8:10 AM Robina Ade, Helene Kelp D wrote: CRM for notification. See Telephone encounter for: 10/27/18. Lucy with [employer] called and said she would like to talk to Dr. Ancil Boozer CMA regarding patients disability form. She would like to know patients disable symptoms, deficits, treatment plan and return to work date. She can be reached at [phone]

## 2018-10-27 NOTE — Telephone Encounter (Signed)
She needs to finish evaluation by PT and neurologist. She states unable to walk and stand but needs objective findings.

## 2018-10-27 NOTE — Telephone Encounter (Signed)
Called Lucy from [facility] and informed her of patient last visit on 10/06/2018 and her upcoming appointment with PT for functional capacity evaluation. Pr Dr. Ancil Boozer patients disable symptoms include her ability to walk and stand but needs objective findings from PT. There is no return date as of right now.

## 2018-10-28 NOTE — Telephone Encounter (Signed)
Copied from [place] [id number]. Topic: Quick Communication - See Telephone Encounter >> Oct 28, 2018  1:07 PM Blase Mess A wrote: CRM for notification. See Telephone encounter for: 10/28/18.  Patient is calling to speak to Jonelle Sidle because her 11/04/18 capacity test was canceled. Please advise. CB [phone]

## 2018-10-28 NOTE — Telephone Encounter (Signed)
PT in [place] was closed today but per patient and documentation on [facility] rescheduled patient appointment until 11/18/2018 at 9 a.m. Left a message with Lorre Nick from [place] to notify her of new appointment date due to COVID-19

## 2018-11-01 ENCOUNTER — Encounter: Payer: Self-pay | Admitting: Physical Therapy

## 2018-11-01 ENCOUNTER — Ambulatory Visit: Payer: BLUE CROSS/BLUE SHIELD | Admitting: Physical Therapy

## 2018-11-01 NOTE — Therapy (Deleted)
[place] MAIN [facility] SERVICES [address] [place], [place], [address] Phone: [phone]   Fax:  [phone]  Patient Details  Name: [name] MRN: [id number] Date of Birth: Apr 26, 1971 Referring Provider:  No ref. provider found  Encounter Date: 11/01/2018 Patient was called about the clinic being closed and that her episode for PT would be DCed. She is waiting for a FCT.  [address], [place] DPT 11/01/2018, 12:29 PM  [place] MAIN [facility] SERVICES [address] [place], [place], [address] Phone: [phone]   Fax:  [phone]

## 2018-11-01 NOTE — Therapy (Signed)
[place] MAIN [facility] SERVICES [address] [place], [place], [address] Phone: [phone]   Fax:  [phone]  November 01, 2018   @CCLISTADDRESS @  Physical Therapy Discharge Summary  Patient: Mareta Chesnut  MRN: [id number]  Date of Birth: 04/17/1971   Diagnosis: No diagnosis found. Referring Provider (PT): sowles Drue Stager   The above patient had been seen in Physical Therapy  10 times of  17  treatments scheduled with  0  no shows and  Several  cancellations.  The treatment consisted of therapeutic exercise and gait training. The patient is: improved  Subjective: Patient continues to have pain and inability to stand for long amounts of time  Discharge Findings: Patient has decreased gait speed and decreased standing tolerance.  Functional Status at Discharge: undetermined due to not coming for last several visits, progress note was performed at last visit for current status.  PT Long Term Goals - 10/05/18 9191      PT LONG TERM GOAL #1   Title  Patient will be independent in home exercise program to improve strength/mobility for better functional independence with ADLs    Time  8    Period  Weeks    Status  Partially Met    Target Date  10/12/18      PT LONG TERM GOAL #2   Title  Patient (< 44 years old) will complete five times sit to stand test in < 10 seconds indicating an increased LE strength and improved balance.    Baseline  10/05/18=21.48    Time  8    Period  Weeks    Status  New    Target Date  10/12/18      PT LONG TERM GOAL #3   Title  Patient will increase six minute walk test distance to >1000 for progression to community ambulator and improve gait ability    Baseline  10/05/18=430 feet    Time  8    Period  Weeks    Status  Partially Met    Target Date  10/12/18      PT LONG TERM GOAL #4   Title  Patient will reduce timed up and go to <11 seconds to reduce fall risk and demonstrate improved transfer/gait  ability.    Baseline  10/05/18=19.55    Time  8    Period  Weeks    Status  Partially Met    Target Date  10/12/18         Sincerely,   Sherre Poot, Sherryl Barters, PT, DPT   CC @[email or web address] @  [place] [address] [place], [place], [address] Phone: [phone]   Fax:  [phone]  Patient: Armilda Vanderlinden  MRN: [id number]  Date of Birth: [birth year]

## 2018-11-04 ENCOUNTER — Encounter: Payer: BLUE CROSS/BLUE SHIELD | Admitting: Physical Therapy

## 2018-11-14 ENCOUNTER — Telehealth: Payer: Self-pay | Admitting: Family Medicine

## 2018-11-14 NOTE — Telephone Encounter (Addendum)
Patient states she has video chat with her Neurologist on Wednesday at 7:40 a.m. and will discuss this with him over her video chat. She states she will need her paperwork completed so she will be able to get paid by her [place]. Also she will call us back Wednesday to let us know what he says during her video conference with him.

## 2018-11-14 NOTE — Telephone Encounter (Signed)
Clarifying that it is a PT not PFT

## 2018-11-14 NOTE — Telephone Encounter (Signed)
Pt called in saying that she was to have a PFT test done and they have called and cancelled it 2 times due to this virus and she needs to know what she can do for she has to have this done before she can go back to work. Please advise. Pt [phone]

## 2018-11-18 ENCOUNTER — Encounter: Payer: BLUE CROSS/BLUE SHIELD | Admitting: Physical Therapy

## 2018-11-21 ENCOUNTER — Ambulatory Visit: Payer: BLUE CROSS/BLUE SHIELD | Admitting: Family Medicine

## 2018-12-01 ENCOUNTER — Other Ambulatory Visit: Payer: Self-pay | Admitting: Orthopedic Surgery

## 2018-12-01 DIAGNOSIS — M75101 Unspecified rotator cuff tear or rupture of right shoulder, not specified as traumatic: Secondary | ICD-10-CM

## 2018-12-01 DIAGNOSIS — S42034D Nondisplaced fracture of lateral end of right clavicle, subsequent encounter for fracture with routine healing: Secondary | ICD-10-CM

## 2018-12-01 DIAGNOSIS — S4351XD Sprain of right acromioclavicular joint, subsequent encounter: Secondary | ICD-10-CM

## 2018-12-03 DIAGNOSIS — R29898 Other symptoms and signs involving the musculoskeletal system: Secondary | ICD-10-CM | POA: Insufficient documentation

## 2019-02-14 ENCOUNTER — Inpatient Hospital Stay: Payer: BC Managed Care – PPO

## 2019-05-26 ENCOUNTER — Ambulatory Visit: Payer: BLUE CROSS/BLUE SHIELD | Admitting: Obstetrics & Gynecology

## 2019-06-12 ENCOUNTER — Other Ambulatory Visit: Payer: Self-pay

## 2019-06-20 ENCOUNTER — Other Ambulatory Visit: Payer: Self-pay

## 2019-06-20 ENCOUNTER — Encounter: Payer: Self-pay | Admitting: Obstetrics & Gynecology

## 2019-06-20 ENCOUNTER — Ambulatory Visit (INDEPENDENT_AMBULATORY_CARE_PROVIDER_SITE_OTHER): Payer: BC Managed Care – PPO | Admitting: Obstetrics & Gynecology

## 2019-06-20 VITALS — BP 122/88 | Ht 61.0 in | Wt 320.0 lb

## 2019-06-20 DIAGNOSIS — R232 Flushing: Secondary | ICD-10-CM

## 2019-06-20 DIAGNOSIS — Z1231 Encounter for screening mammogram for malignant neoplasm of breast: Secondary | ICD-10-CM

## 2019-06-20 DIAGNOSIS — Z01419 Encounter for gynecological examination (general) (routine) without abnormal findings: Secondary | ICD-10-CM | POA: Diagnosis not present

## 2019-06-20 DIAGNOSIS — Z1211 Encounter for screening for malignant neoplasm of colon: Secondary | ICD-10-CM

## 2019-06-20 MED ORDER — PHENTERMINE HCL 37.5 MG PO TABS: ORAL_TABLET | ORAL | 0 refills | Status: DC

## 2019-06-20 MED ORDER — CLONIDINE 0.1 MG/24HR TD PTWK: 0.1000 mg | MEDICATED_PATCH | TRANSDERMAL | 11 refills | Status: DC

## 2019-06-20 NOTE — Progress Notes (Signed)
HPI:      [name] is a 47 y.o. G2P1011 who LMP was Patient's last menstrual period was 03/26/2019 (exact date)., she presents today for her annual examination. The patient has no complaints today. The patient is sexually active. Her last pap: approximate date 2018 and was normal and last mammogram: approximate date 2019 and was normal. The patient does perform self breast exams.  There is no notable family history of breast or ovarian cancer in her family.  The patient has regular exercise: yes.  The patient denies current symptoms of depression.    GYN History: Contraception: tubal ligation  PMHx: Past Medical History:  Diagnosis Date  . Asthma   . Difficulty breathing   . Family history of breast cancer    BRCA testing letter sent  . Sinus complaint    Past Surgical History:  Procedure Laterality Date  . CESAREAN SECTION  1989  . COLONOSCOPY    . FOOT SURGERY Left 2013  . HERNIA REPAIR     Family History  Problem Relation Age of Onset  . Diabetes Mother   . Uterine cancer Mother   . Colon cancer Mother   . Cancer - Lung Father   . Colon cancer Father   . Diverticulitis Brother   . Hernia Brother        x2 surgeries  . Colon cancer Paternal Grandmother   . Lung cancer Paternal Grandmother    Social History   Tobacco Use  . Smoking status: Never Smoker  . Smokeless tobacco: Never Used  Substance Use Topics  . Alcohol use: Yes    Comment: occasionally  . Drug use: No    Current Outpatient Medications:  .  Albuterol Sulfate (VENTOLIN HFA IN), Inhale into the lungs as needed (for shortness of breath). , Disp: , Rfl:  .  aspirin 81 MG chewable tablet, Chew 1 tablet (81 mg total) by mouth daily., Disp: 30 tablet, Rfl: 0 .  atorvastatin (LIPITOR) 40 MG tablet, Take 1 tablet by mouth daily., Disp: , Rfl:  .  Cinnamon 500 MG TABS, Take 2 tablets by mouth daily., Disp: , Rfl:  .  escitalopram (LEXAPRO) 5 MG tablet, Take 1 tablet (5 mg total) by mouth  daily., Disp: 30 tablet, Rfl: 0 .  fluconazole (DIFLUCAN) 150 MG tablet, Take 1 tablet (150 mg total) by mouth every other day., Disp: 3 tablet, Rfl: 0 .  Fluticasone-Umeclidin-Vilant (TRELEGY ELLIPTA) 100-62.5-25 MCG/INH AEPB, Inhale into the lungs., Disp: , Rfl:  .  hydrOXYzine (ATARAX/VISTARIL) 25 MG tablet, TAKE 1 TABLET BY MOUTH THREE TIMES DAILY AS NEEDED FOR ITCHING, Disp: , Rfl:  .  JENCYCLA 0.35 MG tablet, , Disp: , Rfl:  .  Lorcaserin HCl ER (BELVIQ XR) 20 MG TB24, Take 1 tablet by mouth daily., Disp: 30 tablet, Rfl: 2 .  mometasone-formoterol (DULERA) 200-5 MCG/ACT AERO, Inhale 2 puffs into the lungs 2 (two) times daily., Disp: , Rfl:  .  Multiple Vitamins-Calcium (ONE-A-DAY WITHIN PO), Take 1 tablet by mouth daily., Disp: , Rfl:  .  Omega 3 1000 MG CAPS, Take 1 capsule by mouth daily., Disp: , Rfl:  .  theophylline (THEODUR) 300 MG 12 hr tablet, Take 300 mg by mouth daily., Disp: , Rfl:  .  Tiotropium Bromide Monohydrate (SPIRIVA RESPIMAT) 1.25 MCG/ACT AERS, Inhale into the lungs., Disp: , Rfl:  .  traMADol (ULTRAM) 50 MG tablet, Take 50 mg by mouth every 8 (eight) hours as needed. for pain, Disp: ,  Rfl:  .  triamcinolone cream (KENALOG) 0.1 %, Apply topically., Disp: , Rfl:  .  vitamin B-12 (CYANOCOBALAMIN) 1000 MCG tablet, Take 1,000 mcg by mouth daily., Disp: , Rfl:  .  Vitamin D, Ergocalciferol, (DRISDOL) 1.25 MG (50000 UT) CAPS capsule, Take 1 capsule (50,000 Units total) by mouth every 7 (seven) days. For 3 months., Disp: 4 capsule, Rfl: 2 .  cloNIDine (CATAPRES - DOSED IN MG/24 HR) 0.1 mg/24hr patch, Place 1 patch (0.1 mg total) onto the skin once a week., Disp: 4 patch, Rfl: 11 .  montelukast (SINGULAIR) 10 MG tablet, Take 1 tablet by mouth daily., Disp: , Rfl:  .  phentermine (ADIPEX-P) 37.5 MG tablet, One tablet po in morning., Disp: 30 tablet, Rfl: 0 Allergies: Advil [ibuprofen], Percocet [oxycodone-acetaminophen], and Azithromycin  Review of Systems  Constitutional:  Negative for chills, fever and malaise/fatigue.  HENT: Negative for congestion, sinus pain and sore throat.   Eyes: Negative for blurred vision and pain.  Respiratory: Positive for shortness of breath and wheezing. Negative for cough.   Cardiovascular: Negative for chest pain and leg swelling.  Gastrointestinal: Positive for constipation and diarrhea. Negative for abdominal pain, heartburn, nausea and vomiting.  Genitourinary: Negative for dysuria, frequency, hematuria and urgency.  Musculoskeletal: Positive for joint pain. Negative for back pain, myalgias and neck pain.  Skin: Negative for itching and rash.  Neurological: Positive for weakness. Negative for dizziness and tremors.  Endo/Heme/Allergies: Bruises/bleeds easily.  Psychiatric/Behavioral: Negative for depression. The patient is not nervous/anxious and does not have insomnia.     Objective: BP 122/88   Ht _0  (1.549 m)   Wt (!) 320 lb (145.2 kg)   LMP 03/26/2019 (Exact Date)   BMI 60.46 kg/m   Filed Weights   06/20/19 1455  Weight: (!) 320 lb (145.2 kg)   Body mass index is 60.46 kg/m. Physical Exam Constitutional:      General: She is not in acute distress.    Appearance: She is well-developed.  Genitourinary:     Pelvic exam was performed with patient supine.     Vagina, uterus and rectum normal.     No lesions in the vagina.     No vaginal bleeding.     No cervical motion tenderness, friability, lesion or polyp.     Uterus is mobile.     Uterus is not enlarged.     No uterine mass detected.    Uterus is midaxial.     No right or left adnexal mass present.     Right adnexa not tender.     Left adnexa not tender.  HENT:     Head: Normocephalic and atraumatic. No laceration.     Right Ear: Hearing normal.     Left Ear: Hearing normal.     Mouth/Throat:     Pharynx: Uvula midline.  Eyes:     Pupils: Pupils are equal, round, and reactive to light.  Neck:     Musculoskeletal: Normal range of motion and  neck supple.     Thyroid: No thyromegaly.  Cardiovascular:     Rate and Rhythm: Normal rate and regular rhythm.     Heart sounds: No murmur. No friction rub. No gallop.   Pulmonary:     Effort: Pulmonary effort is normal. No respiratory distress.     Breath sounds: Normal breath sounds. No wheezing.  Chest:     Breasts:        Right: No mass, skin change or tenderness.  Left: No mass, skin change or tenderness.  Abdominal:     General: Bowel sounds are normal. There is no distension.     Palpations: Abdomen is soft.     Tenderness: There is no abdominal tenderness. There is no rebound.  Musculoskeletal: Normal range of motion.  Neurological:     Mental Status: She is alert and oriented to person, place, and time.     Cranial Nerves: No cranial nerve deficit.  Skin:    General: Skin is warm and dry.  Psychiatric:        Judgment: Judgment normal.  Vitals signs reviewed.     Assessment:  ANNUAL EXAM 1. Women's annual routine gynecological examination   2. Encounter for screening mammogram for malignant neoplasm of breast   3. Morbid obesity ([place])   4. Screen for colon cancer   5. Vasomotor flushing      Screening Plan:            1.  Cervical Screening-  Pap smear schedule reviewed with patient  2. Breast screening- Exam annually and mammogram>40 planned   3. Colonoscopy every 10 years, Hemoccult testing - after age 55 but needs soon due to [place] colon cancer  4. Labs managed by PCP  5. Counseling for contraception: bilateral tubal ligation   6. Morbid obesity (H[facility]) - phentermine (ADIPEX-P) 37.5 MG tablet; One tablet po in morning.  Dispense: 30 tablet; Refill: 0   7. Vasomotor flushing - cloNIDine (CATAPRES - DOSED IN MG/24 HR) 0.1 mg/24hr patch; Place 1 patch (0.1 mg total) onto the skin once a week.  Dispense: 4 patch; Refill: 11      F/U  Return in about 1 year (around 06/19/2020) for Annual.  Barnett Applebaum, MD, [facility], [facility] 06/20/2019  3:24 PM

## 2019-06-20 NOTE — Patient Instructions (Addendum)
PAP every three years Mammogram every year    Call [phone] to schedule at [facility] Colonoscopy every 10 years Labs yearly (with PCP)  Primary Care in the area  This is not meant to be comprehensive list, but a resource for some providers that you can call for counseling or medication needs. If you have a recommendation, please let us know.   [facility]:  [phone]               Dr Miguel Aschoff               Dr Juanetta Beets               Dr Lavon Paganini  [facility], [place]:   [phone]  Owens Loffler, MD  Waunita Schooner, MD  Ria Bush, MD  Jinny Sanders, MD  [facility], [place]: [phone]  Orland Mustard, MD   Clonidine skin patches What is this medicine? CLONIDINE (KLOE ni deen) is used to treat high blood pressure. This medicine may be used for other purposes; ask your health care provider or pharmacist if you have questions. COMMON BRAND NAME(S): Catapres-TTS What should I tell my health care provider before I take this medicine? They need to know if you have any of these conditions:  kidney disease  an unusual or allergic reaction to clonidine, other medicines, foods, dyes, or preservatives  pregnant or trying to get pregnant  breast-feeding How should I use this medicine? This medicine is for external use only. Follow the directions on the prescription label. Apply the patch to an area of the upper arm or part of the body that is clean, dry and hairless. Avoid injured, irritated, calloused, or scarred areas. Use a different site each time to prevent skin irritation. Do not cut or trim the patch. One patch should last for 7 days. Do not use your medicine more often than directed. Do not stop using except on the advice of your doctor or health care professional. You must gradually reduce the dose or you may get a dangerous increase in blood pressure. Talk to your pediatrician regarding the  use of this medicine in children. Special care may be needed. Overdosage: If you think you have taken too much of this medicine contact a poison control center or emergency room at once. NOTE: This medicine is only for you. Do not share this medicine with others. What if I miss a dose? Replace each patch on the same day of each week, or if the patch falls off. If you do forget to change the patch for two or three days, check with your doctor or health care professional. What may interact with this medicine? Do not take this medicine with any of the following medications:  MAOIs like Carbex, Eldepryl, Marplan, Nardil, and Parnate This medicine may also interact with the following medications:  barbiturate medicines for inducing sleep or treating seizures like phenobarbital  certain medicines for blood pressure, heart disease, irregular heart beat  certain medicines for depression, anxiety, or psychotic disturbances  prescription pain medicines This list may not describe all possible interactions. Give your health care provider a list of all the medicines, herbs, non-prescription drugs, or dietary supplements you use. Also tell them if you smoke, drink alcohol, or use illegal drugs. Some items may interact with your medicine. What should I watch for while using this medicine? Visit your doctor or health care professional for regular checks on your progress. Check your  heart rate and blood pressure regularly while you are using this medicine. Ask your doctor or health care professional what your heart rate should be and when you should contact him or her. You can shower or bathe with the skin patch in position. If the patch gets loose, cover it with the extra adhesive overlay provided. You may get drowsy or dizzy. Do not drive, use machinery, or do anything that needs mental alertness until you know how this medicine affects you. To avoid dizzy or fainting spells, do not stand or sit up quickly,  especially if you are an older person. Alcohol can make you more drowsy and dizzy. Avoid alcoholic drinks. Your mouth may get dry. Chewing sugarless gum or sucking hard candy, and drinking plenty of water will help. Do not treat yourself for coughs, colds, or pain while you are using this medicine without asking your doctor or health care professional for advice. Some ingredients may increase your blood pressure. If you are going to have surgery tell your doctor or health care professional that you are using this medicine. If you are going to have a magnetic resonance imaging (MRI) procedure, tell your MRI technician if you have this patch on your body. It must be removed before a MRI. What side effects may I notice from receiving this medicine? Side effects that you should report to your doctor or health care professional as soon as possible:  allergic reactions like skin rash, itching or hives, swelling of the face, lips, or tongue  anxiety, nervousness  chest pain  depression  fast, irregular heartbeat  swelling of feet or legs  unusually weak or tired Side effects that usually do not require medical attention (report to your doctor or health care professional if they continue or are bothersome):  change in sex drive or performance  constipation  headache  skin redness, irritation, or darkening under the patch area This list may not describe all possible side effects. Call your doctor for medical advice about side effects. You may report side effects to FDA at 1-800-FDA-1088. Where should I keep my medicine? Keep out of the reach of children. Store at room temperature between 15 and 30 degrees C (59 to 86 degrees F). Throw away any unused medicine after the expiration date. NOTE: This sheet is a summary. It may not cover all possible information. If you have questions about this medicine, talk to your doctor, pharmacist, or health care provider.  2020 Elsevier/Gold Standard  (2015-04-08 EF:[id number])

## 2019-06-23 ENCOUNTER — Ambulatory Visit: Payer: Self-pay | Admitting: Obstetrics & Gynecology

## 2019-06-26 ENCOUNTER — Telehealth: Payer: Self-pay | Admitting: Gastroenterology

## 2019-06-26 ENCOUNTER — Other Ambulatory Visit: Payer: Self-pay

## 2019-06-26 DIAGNOSIS — Z800 Family history of malignant neoplasm of digestive organs: Secondary | ICD-10-CM

## 2019-06-26 DIAGNOSIS — Z1211 Encounter for screening for malignant neoplasm of colon: Secondary | ICD-10-CM

## 2019-06-26 NOTE — Telephone Encounter (Signed)
Patient called to schedule a colonoscopy.

## 2019-06-26 NOTE — Telephone Encounter (Signed)
Gastroenterology Pre-Procedure Review  Request Date: 07/11/19 Requesting Physician: Dr. Bonna Gains  PATIENT REVIEW QUESTIONS: The patient responded to the following health history questions as indicated:    1. Are you having any GI issues? no 2. Do you have a personal history of Polyps? no 3. Do you have a family history of Colon Cancer or Polyps? yes (dad colon cancer) 4. Diabetes Mellitus? no 5. Joint replacements in the past 12 months?no 6. Major health problems in the past 3 months?no 7. Any artificial heart valves, MVP, or defibrillator?no    MEDICATIONS & ALLERGIES:    Patient reports the following regarding taking any anticoagulation/antiplatelet therapy:   Plavix, Coumadin, Eliquis, Xarelto, Lovenox, Pradaxa, Brilinta, or Effient? no Aspirin? yes (81 mg daily)  Patient confirms/reports the following medications:  Current Outpatient Medications  Medication Sig Dispense Refill  . Albuterol Sulfate (VENTOLIN HFA IN) Inhale into the lungs as needed (for shortness of breath).     Marland Kitchen aspirin 81 MG chewable tablet Chew 1 tablet (81 mg total) by mouth daily. 30 tablet 0  . atorvastatin (LIPITOR) 40 MG tablet Take 1 tablet by mouth daily.    . Cinnamon 500 MG TABS Take 2 tablets by mouth daily.    . cloNIDine (CATAPRES - DOSED IN MG/24 HR) 0.1 mg/24hr patch Place 1 patch (0.1 mg total) onto the skin once a week. 4 patch 11  . escitalopram (LEXAPRO) 5 MG tablet Take 1 tablet (5 mg total) by mouth daily. 30 tablet 0  . fluconazole (DIFLUCAN) 150 MG tablet Take 1 tablet (150 mg total) by mouth every other day. 3 tablet 0  . Fluticasone-Umeclidin-Vilant (TRELEGY ELLIPTA) 100-62.5-25 MCG/INH AEPB Inhale into the lungs.    . hydrOXYzine (ATARAX/VISTARIL) 25 MG tablet TAKE 1 TABLET BY MOUTH THREE TIMES DAILY AS NEEDED FOR ITCHING    . JENCYCLA 0.35 MG tablet     . Lorcaserin HCl ER (BELVIQ XR) 20 MG TB24 Take 1 tablet by mouth daily. 30 tablet 2  . mometasone-formoterol (DULERA) 200-5 MCG/ACT  AERO Inhale 2 puffs into the lungs 2 (two) times daily.    . montelukast (SINGULAIR) 10 MG tablet Take 1 tablet by mouth daily.    . Multiple Vitamins-Calcium (ONE-A-DAY WITHIN PO) Take 1 tablet by mouth daily.    . Omega 3 1000 MG CAPS Take 1 capsule by mouth daily.    . phentermine (ADIPEX-P) 37.5 MG tablet One tablet po in morning. 30 tablet 0  . theophylline (THEODUR) 300 MG 12 hr tablet Take 300 mg by mouth daily.    . Tiotropium Bromide Monohydrate (SPIRIVA RESPIMAT) 1.25 MCG/ACT AERS Inhale into the lungs.    . traMADol (ULTRAM) 50 MG tablet Take 50 mg by mouth every 8 (eight) hours as needed. for pain    . triamcinolone cream (KENALOG) 0.1 % Apply topically.    . vitamin B-12 (CYANOCOBALAMIN) 1000 MCG tablet Take 1,000 mcg by mouth daily.    . Vitamin D, Ergocalciferol, (DRISDOL) 1.25 MG (50000 UT) CAPS capsule Take 1 capsule (50,000 Units total) by mouth every 7 (seven) days. For 3 months. 4 capsule 2   No current facility-administered medications for this visit.     Patient confirms/reports the following allergies:  Allergies  Allergen Reactions  . Advil [Ibuprofen] Other (See Comments)    Other Reaction: Other reaction-swells  . Percocet [Oxycodone-Acetaminophen] Nausea And Vomiting and Other (See Comments)    headaches  . Azithromycin Rash    PATIENT UNSURE IF RASH WAS ACTUALLY DUE TO ABX  No orders of the defined types were placed in this encounter.   AUTHORIZATION INFORMATION Primary Insurance: 1D#: Group #:  Secondary Insurance: 1D#: Group #:  SCHEDULE INFORMATION: Date: Tuesday 07/11/19 Time: Location: [place]

## 2019-06-30 ENCOUNTER — Ambulatory Visit
Admission: RE | Admit: 2019-06-30 | Discharge: 2019-06-30 | Disposition: A | Discharge status: Home / Self Care | Payer: BC Managed Care – PPO | Source: Ambulatory Visit | Attending: Obstetrics & Gynecology | Admitting: Obstetrics & Gynecology

## 2019-06-30 DIAGNOSIS — Z1231 Encounter for screening mammogram for malignant neoplasm of breast: Secondary | ICD-10-CM | POA: Diagnosis not present

## 2019-07-03 ENCOUNTER — Encounter: Payer: Self-pay | Admitting: Obstetrics & Gynecology

## 2019-07-07 ENCOUNTER — Other Ambulatory Visit
Admission: RE | Admit: 2019-07-07 | Discharge: 2019-07-07 | Disposition: A | Discharge status: Home / Self Care | Payer: BC Managed Care – PPO | Source: Ambulatory Visit | Attending: Gastroenterology | Admitting: Gastroenterology

## 2019-07-07 ENCOUNTER — Other Ambulatory Visit: Payer: Self-pay

## 2019-07-07 DIAGNOSIS — Z20828 Contact with and (suspected) exposure to other viral communicable diseases: Secondary | ICD-10-CM | POA: Insufficient documentation

## 2019-07-07 DIAGNOSIS — Z01812 Encounter for preprocedural laboratory examination: Secondary | ICD-10-CM | POA: Insufficient documentation

## 2019-07-08 LAB — SARS CORONAVIRUS 2 (TAT 6-24 HRS): SARS Coronavirus 2: NEGATIVE

## 2019-07-10 ENCOUNTER — Telehealth: Payer: Self-pay | Admitting: Gastroenterology

## 2019-07-10 ENCOUNTER — Other Ambulatory Visit: Payer: Self-pay

## 2019-07-10 MED ORDER — GOLYTELY 236 G PO SOLR: 4000.0000 mL | Freq: Once | ORAL | 0 refills | Status: AC

## 2019-07-10 NOTE — Telephone Encounter (Signed)
PT NEEDS A CHEAPER RX FOR COLONOSCOPY TOMORROW PLEASE SEND TO [facility]

## 2019-07-10 NOTE — Telephone Encounter (Signed)
Returned patients call.  Informed her that I've sent in a rx for goltyely bowel prep.  Instructions provided to patient for Golytely bowel prep.  Thanks [employer]

## 2019-07-11 ENCOUNTER — Ambulatory Visit: Payer: BC Managed Care – PPO | Admitting: Certified Registered Nurse Anesthetist

## 2019-07-11 ENCOUNTER — Encounter: Payer: Self-pay | Admitting: *Deleted

## 2019-07-11 ENCOUNTER — Ambulatory Visit
Admission: RE | Admit: 2019-07-11 | Discharge: 2019-07-11 | Disposition: A | Discharge status: Home / Self Care | Payer: BC Managed Care – PPO | Attending: Gastroenterology | Admitting: Gastroenterology

## 2019-07-11 ENCOUNTER — Encounter
Admission: RE | Disposition: A | Discharge status: Home / Self Care | Payer: Self-pay | Source: Home / Self Care | Attending: Gastroenterology

## 2019-07-11 DIAGNOSIS — Z885 Allergy status to narcotic agent status: Secondary | ICD-10-CM | POA: Insufficient documentation

## 2019-07-11 DIAGNOSIS — Z7982 Long term (current) use of aspirin: Secondary | ICD-10-CM | POA: Insufficient documentation

## 2019-07-11 DIAGNOSIS — D122 Benign neoplasm of ascending colon: Secondary | ICD-10-CM | POA: Diagnosis not present

## 2019-07-11 DIAGNOSIS — Z800 Family history of malignant neoplasm of digestive organs: Secondary | ICD-10-CM

## 2019-07-11 DIAGNOSIS — Z8673 Personal history of transient ischemic attack (TIA), and cerebral infarction without residual deficits: Secondary | ICD-10-CM | POA: Diagnosis not present

## 2019-07-11 DIAGNOSIS — D120 Benign neoplasm of cecum: Secondary | ICD-10-CM | POA: Diagnosis not present

## 2019-07-11 DIAGNOSIS — Z881 Allergy status to other antibiotic agents status: Secondary | ICD-10-CM | POA: Insufficient documentation

## 2019-07-11 DIAGNOSIS — D123 Benign neoplasm of transverse colon: Secondary | ICD-10-CM | POA: Diagnosis not present

## 2019-07-11 DIAGNOSIS — K635 Polyp of colon: Secondary | ICD-10-CM | POA: Diagnosis not present

## 2019-07-11 DIAGNOSIS — Z793 Long term (current) use of hormonal contraceptives: Secondary | ICD-10-CM | POA: Insufficient documentation

## 2019-07-11 DIAGNOSIS — Z1211 Encounter for screening for malignant neoplasm of colon: Secondary | ICD-10-CM | POA: Diagnosis present

## 2019-07-11 DIAGNOSIS — J45909 Unspecified asthma, uncomplicated: Secondary | ICD-10-CM | POA: Diagnosis not present

## 2019-07-11 DIAGNOSIS — F329 Major depressive disorder, single episode, unspecified: Secondary | ICD-10-CM | POA: Insufficient documentation

## 2019-07-11 DIAGNOSIS — Z79899 Other long term (current) drug therapy: Secondary | ICD-10-CM | POA: Insufficient documentation

## 2019-07-11 DIAGNOSIS — Z7951 Long term (current) use of inhaled steroids: Secondary | ICD-10-CM | POA: Diagnosis not present

## 2019-07-11 DIAGNOSIS — K5730 Diverticulosis of large intestine without perforation or abscess without bleeding: Secondary | ICD-10-CM | POA: Insufficient documentation

## 2019-07-11 HISTORY — PX: COLONOSCOPY: SHX5424

## 2019-07-11 HISTORY — DX: Cerebral infarction, unspecified: I63.9

## 2019-07-11 LAB — POCT PREGNANCY, URINE: Preg Test, Ur: NEGATIVE

## 2019-07-11 SURGERY — COLONOSCOPY
Anesthesia: General

## 2019-07-11 MED ORDER — LIDOCAINE HCL (CARDIAC) PF 100 MG/5ML IV SOSY
PREFILLED_SYRINGE | INTRAVENOUS | Status: DC | PRN
  Administered 2019-07-11: 50 mg via INTRAVENOUS

## 2019-07-11 MED ORDER — PROPOFOL 500 MG/50ML IV EMUL
INTRAVENOUS | Status: AC
  Filled 2019-07-11: qty 50

## 2019-07-11 MED ORDER — PROPOFOL 10 MG/ML IV BOLUS
INTRAVENOUS | Status: DC | PRN
  Administered 2019-07-11: 80 mg via INTRAVENOUS
  Administered 2019-07-11 (×2): 20 mg via INTRAVENOUS
  Administered 2019-07-11: 50 mg via INTRAVENOUS

## 2019-07-11 MED ORDER — PROPOFOL 500 MG/50ML IV EMUL
INTRAVENOUS | Status: DC | PRN
  Administered 2019-07-11: 175 ug/kg/min via INTRAVENOUS

## 2019-07-11 MED ORDER — SODIUM CHLORIDE 0.9 % IV SOLN
INTRAVENOUS | Status: DC
  Administered 2019-07-11: 10:00:00 via INTRAVENOUS

## 2019-07-11 NOTE — Anesthesia Procedure Notes (Signed)
Date/Time: 07/11/2019 10:00 AM Performed by: Johnna Acosta, CRNA Pre-anesthesia Checklist: Patient identified, Emergency Drugs available, Suction available, Patient being monitored and Timeout performed Patient Re-evaluated:Patient Re-evaluated prior to induction Oxygen Delivery Method: Nasal cannula Preoxygenation: Pre-oxygenation with 100% oxygen

## 2019-07-11 NOTE — Anesthesia Preprocedure Evaluation (Signed)
Anesthesia Evaluation  Patient identified by MRN, date of birth, ID band Patient awake    Reviewed: Allergy & Precautions, NPO status , Patient's Chart, lab work & pertinent test results  History of Anesthesia Complications Negative for: history of anesthetic complications  Airway Mallampati: III  TM Distance: >3 FB Neck ROM: Full    Dental no notable dental hx.    Pulmonary asthma , neg sleep apnea,    breath sounds clear to auscultation- rhonchi (-) wheezing      Cardiovascular Exercise Tolerance: Good (-) hypertension(-) CAD, (-) Past MI, (-) Cardiac Stents and (-) CABG  Rhythm:Regular Rate:Normal - Systolic murmurs and - Diastolic murmurs    Neuro/Psych PSYCHIATRIC DISORDERS Depression CVA, No Residual Symptoms    GI/Hepatic negative GI ROS, Neg liver ROS,   Endo/Other  negative endocrine ROSneg diabetes  Renal/GU negative Renal ROS     Musculoskeletal negative musculoskeletal ROS (+)   Abdominal (+) + obese,   Peds  Hematology negative hematology ROS (+)   Anesthesia Other Findings Past Medical History: No date: Asthma No date: Difficulty breathing No date: Family history of breast cancer     Comment:  BRCA testing letter sent No date: Sinus complaint No date: Stroke [facility])   Reproductive/Obstetrics                             Anesthesia Physical Anesthesia Plan  ASA: III  Anesthesia Plan: General   Post-op Pain Management:    Induction: Intravenous  PONV Risk Score and Plan: 2 and Propofol infusion  Airway Management Planned: Natural Airway  Additional Equipment:   Intra-op Plan:   Post-operative Plan:   Informed Consent: I have reviewed the patients History and Physical, chart, labs and discussed the procedure including the risks, benefits and alternatives for the proposed anesthesia with the patient or authorized representative who has indicated his/her  understanding and acceptance.     Dental advisory given  Plan Discussed with: CRNA and Anesthesiologist  Anesthesia Plan Comments:         Anesthesia Quick Evaluation

## 2019-07-11 NOTE — Anesthesia Postprocedure Evaluation (Signed)
Anesthesia Post Note  Patient: [name]  Procedure(s) Performed: COLONOSCOPY (N/A )  Patient location during evaluation: Endoscopy Anesthesia Type: General Level of consciousness: awake and alert and oriented Pain management: pain level controlled Vital Signs Assessment: post-procedure vital signs reviewed and stable Respiratory status: spontaneous breathing, nonlabored ventilation and respiratory function stable Cardiovascular status: blood pressure returned to baseline and stable Postop Assessment: no signs of nausea or vomiting Anesthetic complications: no     Last Vitals:  Vitals:   07/11/19 1058 07/11/19 1108  BP: 118/79 (!) 115/36  Pulse:    Resp: 18   Temp:    SpO2:      Last Pain:  Vitals:   07/11/19 1108  TempSrc:   PainSc: 0-No pain                 Kamree Wiens

## 2019-07-11 NOTE — Op Note (Signed)
[facility] Gastroenterology Patient Name: [name] Brief Procedure Date: 07/11/2019 9:55 AM MRN: [id number] Account #: [id number] Date of Birth: 1971/05/02 Admit Type: Outpatient Age: 48 Room: [facility] ENDO ROOM 1 Gender: Female Note Status: Finalized Procedure:             Colonoscopy Indications:           Screening in patient at increased risk: Family history                         of 1st-degree relative with colorectal cancer Providers:             Tyresa Prindiville B. Bonna Gains MD, MD Medicines:             Monitored Anesthesia Care Complications:         No immediate complications. Procedure:             Pre-Anesthesia Assessment:                        - ASA Grade Assessment: II - A patient with mild                         systemic disease.                        - Prior to the procedure, a History and Physical was                         performed, and patient medications, allergies and                         sensitivities were reviewed. The patient's tolerance                         of previous anesthesia was reviewed.                        - The risks and benefits of the procedure and the                         sedation options and risks were discussed with the                         patient. All questions were answered and informed                         consent was obtained.                        - Patient identification and proposed procedure were                         verified prior to the procedure by the physician, the                         nurse, the anesthesiologist, the anesthetist and the                         technician. The procedure was verified in the  procedure room.                        After obtaining informed consent, the colonoscope was                         passed under direct vision. Throughout the procedure,                         the patient's blood pressure, pulse, and oxygen       saturations were monitored continuously. The                         Colonoscope was introduced through the anus and                         advanced to the the cecum, identified by appendiceal                         orifice and ileocecal valve. The colonoscopy was                         performed with ease. The patient tolerated the                         procedure well. The quality of the bowel preparation                         was fair. Findings:      The perianal and digital rectal examinations were normal.      Eight sessile polyps were found in the transverse colon, ascending colon       and cecum. The polyps were 4 to 7 mm in size. These polyps were removed       with a cold snare. Resection and retrieval were complete.      Multiple diverticula were found in the sigmoid colon.      The exam was otherwise without abnormality.      The rectum, sigmoid colon, descending colon, transverse colon, ascending       colon and cecum appeared normal.      The retroflexed view of the distal rectum and anal verge was normal and       showed no anal or rectal abnormalities. Impression:            - Preparation of the colon was fair.                        - Eight 4 to 7 mm polyps in the transverse colon, in                         the ascending colon and in the cecum, removed with a                         cold snare. Resected and retrieved.                        - Diverticulosis in the sigmoid colon.                        -  The examination was otherwise normal.                        - The rectum, sigmoid colon, descending colon,                         transverse colon, ascending colon and cecum are normal.                        - The distal rectum and anal verge are normal on                         retroflexion view. Recommendation:        - Discharge patient to home (with escort).                        - High fiber diet.                        - Advance diet as tolerated.                         - Continue present medications.                        - Await pathology results.                        - Repeat colonoscopy in 1 year, with 2 day prep.                        - The findings and recommendations were discussed with                         the patient.                        - The findings and recommendations were discussed with                         the patient's family.                        - Return to primary care physician as previously                         scheduled. Procedure Code(s):     --- Professional ---                        [phone], Colonoscopy, flexible; with removal of                         tumor(s), polyp(s), or other lesion(s) by snare                         technique Diagnosis Code(s):     --- Professional ---                        Z80.0, Family history of malignant neoplasm of  digestive organs                        K63.5, Polyp of colon                        K57.30, Diverticulosis of large intestine without                         perforation or abscess without bleeding CPT copyright 2019 American Medical Association. All rights reserved. The codes documented in this report are preliminary and upon coder review may  be revised to meet current compliance requirements.  [name], MD Margretta Sidle B. Bonna Gains MD, MD 07/11/2019 10:43:28 AM This report has been signed electronically. Number of Addenda: 0 Note Initiated On: 07/11/2019 9:55 AM Scope Withdrawal Time: 0 hours 22 minutes 54 seconds  Total Procedure Duration: 0 hours 28 minutes 33 seconds  Estimated Blood Loss:  Estimated blood loss: none.      [facility]

## 2019-07-11 NOTE — Anesthesia Post-op Follow-up Note (Signed)
Anesthesia QCDR form completed.        

## 2019-07-11 NOTE — Transfer of Care (Signed)
Immediate Anesthesia Transfer of Care Note  Patient: [name]  Procedure(s) Performed: COLONOSCOPY (N/A )  Patient Location: PACU  Anesthesia Type:General  Level of Consciousness: awake and alert   Airway & Oxygen Therapy: Patient Spontanous Breathing and Patient connected to nasal cannula oxygen  Post-op Assessment: Report given to RN and Post -op Vital signs reviewed and stable  Post vital signs: Reviewed and stable  Last Vitals:  Vitals Value Taken Time  BP 112/72 07/11/19 1039  Temp 36.1 C 07/11/19 1038  Pulse 85 07/11/19 1040  Resp 21 07/11/19 1040  SpO2 100 % 07/11/19 1040  Vitals shown include unvalidated device data.  Last Pain:  Vitals:   07/11/19 1038  TempSrc: Temporal  PainSc: 0-No pain         Complications: No apparent anesthesia complications

## 2019-07-11 NOTE — H&P (Signed)
[name], MD [address], [place], [place], [place], [address] [address], [place], [place], [place], [address] Phone: [phone]  Fax: [phone]  Primary Care Physician:  Steele Sizer, MD   Pre-Procedure History & Physical: HPI:  [name] is a 48 y.o. female is here for a colonoscopy.   Past Medical History:  Diagnosis Date  . Asthma   . Difficulty breathing   . Family history of breast cancer    BRCA testing letter sent  . Sinus complaint   . Stroke [facility])     Past Surgical History:  Procedure Laterality Date  . CESAREAN SECTION  1989  . COLONOSCOPY    . FOOT SURGERY Left 2013  . HERNIA REPAIR      Prior to Admission medications   Medication Sig Start Date End Date Taking? Authorizing Provider  Albuterol Sulfate (VENTOLIN HFA IN) Inhale into the lungs as needed (for shortness of breath).    Yes [provider]  aspirin 81 MG chewable tablet Chew 1 tablet (81 mg total) by mouth daily. 07/13/18   Gladstone Lighter, MD  atorvastatin (LIPITOR) 40 MG tablet Take 1 tablet by mouth daily.    [provider]  Cinnamon 500 MG TABS Take 2 tablets by mouth daily.    [provider]  cloNIDine (CATAPRES - DOSED IN MG/24 HR) 0.1 mg/24hr patch Place 1 patch (0.1 mg total) onto the skin once a week. 06/20/19   Gae Dry, MD  escitalopram (LEXAPRO) 5 MG tablet Take 1 tablet (5 mg total) by mouth daily. 09/13/18   Steele Sizer, MD  fluconazole (DIFLUCAN) 150 MG tablet Take 1 tablet (150 mg total) by mouth every other day. Patient not taking: Reported on 07/11/2019 09/19/18   Steele Sizer, MD  Fluticasone-Umeclidin-Vilant (TRELEGY ELLIPTA) 100-62.5-25 MCG/INH AEPB Inhale into the lungs. 05/09/19   [provider]  hydrOXYzine (ATARAX/VISTARIL) 25 MG tablet TAKE 1 TABLET BY MOUTH THREE TIMES DAILY AS NEEDED FOR ITCHING 09/24/18   [provider]  JENCYCLA 0.35 MG tablet  05/16/18   [provider]  Lorcaserin HCl ER (BELVIQ XR) 20 MG TB24 Take 1 tablet by mouth daily. 09/13/18   Steele Sizer, MD  mometasone-formoterol (DULERA) 200-5 MCG/ACT AERO Inhale 2 puffs into the lungs 2 (two) times daily. 01/14/17   Erby Pian, MD  montelukast (SINGULAIR) 10 MG tablet Take 1 tablet by mouth daily. 04/22/18 04/22/19  Erby Pian, MD  Multiple Vitamins-Calcium (ONE-A-DAY WITHIN PO) Take 1 tablet by mouth daily.    [provider]  Omega 3 1000 MG CAPS Take 1 capsule by mouth daily.    [provider]  phentermine (ADIPEX-P) 37.5 MG tablet One tablet po in morning. 06/20/19   Gae Dry, MD  theophylline (THEODUR) 300 MG 12 hr tablet Take 300 mg by mouth daily. 06/05/19   [provider]  Tiotropium Bromide Monohydrate (SPIRIVA RESPIMAT) 1.25 MCG/ACT AERS Inhale into the lungs. 08/24/18 08/24/19  [provider]  traMADol (ULTRAM) 50 MG tablet Take 50 mg by mouth every 8 (eight) hours as needed. for pain 08/29/18   [provider]  triamcinolone cream (KENALOG) 0.1 % Apply topically. 08/28/18   [provider]  vitamin B-12 (CYANOCOBALAMIN) 1000 MCG tablet Take 1,000 mcg by mouth daily.    [provider]  Vitamin D, Ergocalciferol, (DRISDOL) 1.25 MG (50000 UT) CAPS capsule Take 1 capsule (50,000 Units total) by mouth every 7 (seven) days. For 3 months.  06/20/18   Gae Dry, MD    Allergies as of 06/27/2019 - Review Complete 06/20/2019  Allergen Reaction Noted  . Advil [ibuprofen] Other (See Comments) 06/23/2013  . Percocet [oxycodone-acetaminophen] Nausea And Vomiting and Other (See Comments) 08/15/2013  . Azithromycin Rash 09/22/2018    Family History  Problem Relation Age of Onset  . Diabetes Mother   . Uterine cancer Mother   . Colon cancer Mother   . Cancer - Lung Father   . Colon cancer Father   . Diverticulitis Brother   . Hernia Brother        x2 surgeries  . Colon cancer Paternal Grandmother   .  Lung cancer Paternal Grandmother     Social History   Socioeconomic History  . Marital status: Married    Spouse name: Stage manager  . Number of children: 1  . Years of education: Not on file  . Highest education level: Some college, no degree  Occupational History  . Occupation: customer services   Social Needs  . Financial resource strain: Not hard at all  . Food insecurity    Worry: Never true    Inability: Never true  . Transportation needs    Medical: No    Non-medical: No  Tobacco Use  . Smoking status: Never Smoker  . Smokeless tobacco: Never Used  Substance and Sexual Activity  . Alcohol use: Yes    Comment: occasionally  . Drug use: No  . Sexual activity: Yes    Partners: Male    Birth control/protection: Pill  Lifestyle  . Physical activity    Days per week: 5 days    Minutes per session: 30 min  . Stress: Only a little  Relationships  . Social connections    Talks on phone: More than three times a week    Gets together: Twice a week    Attends religious service: More than 4 times per year    Active member of club or organization: No    Attends meetings of clubs or organizations: Never    Relationship status: Married  . Intimate partner violence    Fear of current or ex partner: No    Emotionally abused: No    Physically abused: No    Forced sexual activity: No  Other Topics Concern  . Not on file  Social History Narrative   Married with her high school sweet heart , they had first child at age 7, they recently got married ( on their daughter's birthday 2019), she two miscarriages    Review of Systems: See HPI, otherwise negative ROS  Physical Exam: BP (!) 121/94   Pulse 87   Temp 97.8 F (36.6 C) (Temporal)   Resp 18   Ht 5' 1" (1.549 m)   Wt (!) 142.9 kg   SpO2 99%   BMI 59.52 kg/m  General:   Alert,  pleasant and cooperative in NAD Head:  Normocephalic and atraumatic. Neck:  Supple; no masses or thyromegaly. Lungs:  Clear  throughout to auscultation, normal respiratory effort.    Heart:  +S1, +S2, Regular rate and rhythm, No edema. Abdomen:  Soft, nontender and nondistended. Normal bowel sounds, without guarding, and without rebound.   Neurologic:  Alert and  oriented x4;  grossly normal neurologically.  Impression/Plan: Shimika L Early is here for a colonoscopy to be performed for high risk screening.  Risks, benefits, limitations, and alternatives regarding  colonoscopy have been reviewed with the patient.  Questions have  been answered.  All parties agreeable.   Virgel Manifold, MD  07/11/2019, 9:48 AM

## 2019-07-12 ENCOUNTER — Encounter: Payer: Self-pay | Admitting: Gastroenterology

## 2019-07-12 LAB — SURGICAL PATHOLOGY

## 2019-07-20 ENCOUNTER — Telehealth: Payer: Self-pay

## 2019-07-20 NOTE — Telephone Encounter (Signed)
Called patient and informed patient of results. We sent patient a letter with results. Patient verbalized understanding

## 2019-07-20 NOTE — Telephone Encounter (Signed)
Patient left a voicemail wanted to know her path results from her colonoscopy

## 2019-07-21 ENCOUNTER — Encounter: Payer: Self-pay | Admitting: Obstetrics & Gynecology

## 2019-07-21 ENCOUNTER — Other Ambulatory Visit: Payer: Self-pay

## 2019-07-21 ENCOUNTER — Ambulatory Visit (INDEPENDENT_AMBULATORY_CARE_PROVIDER_SITE_OTHER): Payer: BC Managed Care – PPO | Admitting: Obstetrics & Gynecology

## 2019-07-21 DIAGNOSIS — Z6841 Body Mass Index (BMI) 40.0 and over, adult: Secondary | ICD-10-CM | POA: Diagnosis not present

## 2019-07-21 MED ORDER — PHENTERMINE HCL 37.5 MG PO TABS: ORAL_TABLET | ORAL | 1 refills | Status: DC

## 2019-07-21 NOTE — Progress Notes (Signed)
Virtual Visit via Telephone Note  I connected with [name] on 07/21/19 at  2:30 PM EST by telephone and verified that I am speaking with the correct person using two identifiers.   I discussed the limitations, risks, security and privacy concerns of performing an evaluation and management service by telephone and the availability of in person appointments. I also discussed with the patient that there may be a patient responsible charge related to this service. The patient expressed understanding and agreed to proceed. She was at home and I was in my office.  History of Present Illness:  [name] is a 48 y.o. who was started on Phentermine approximately 1 month ago due to obesity/abnormal weight gain. The patient has lost 10 pounds over the past month due to meds..   She has these side effects: none.  PMHx: She  has a past medical history of Asthma, Difficulty breathing, Family history of breast cancer, Sinus complaint, and Stroke ([place]). Also,  has a past surgical history that includes Cesarean section (1989); Hernia repair; Foot surgery (Left, 2013); Colonoscopy; and Colonoscopy (N/A, 07/11/2019)., family history includes Cancer - Lung in her father; Colon cancer in her father, mother, and paternal grandmother; Diabetes in her mother; Diverticulitis in her brother; Hernia in her brother; Lung cancer in her paternal grandmother; Uterine cancer in her mother.,  reports that she has never smoked. She has never used smokeless tobacco. She reports current alcohol use. She reports that she does not use drugs.  She has a current medication list which includes the following prescription(s): albuterol sulfate, aspirin, atorvastatin, cinnamon, clonidine, escitalopram, fluconazole, trelegy ellipta, hydroxyzine, jencycla, lorcaserin hcl er, mometasone-formoterol, multiple vitamins-calcium, omega 3, phentermine, theophylline, tiotropium bromide monohydrate, tramadol, triamcinolone cream,  vitamin b-12, vitamin d (ergocalciferol), and montelukast. Also, is allergic to advil [ibuprofen]; percocet [oxycodone-acetaminophen]; and azithromycin.  Review of Systems  All other systems reviewed and are negative.   Assessment: morbid obesity Medication treatment is going well for her.   Observations/Objective: No exam today, due to telephone eVisit due to [facility] virus restriction on elective visits and procedures.  Prior visits reviewed along with ultrasounds/labs as indicated. From home: Ht 5\' 1"  (1.549 m)   Wt (!) 310 lb (140.6 kg)   LMP 03/26/2019   BMI 58.57 kg/m   Assessment and Plan:   ICD-10-CM   1. Morbid obesity (HCC)  E66.01 phentermine (ADIPEX-P) 37.5 MG tablet   Plan: Patient is continued/added to prescription appetite suppressants: Phentermine.   Will continue to assist patient in incorporating positive experiences into her life to promote a positive mental attitude.  Education given regarding appropriate lifestyle changes for weight loss, including regular physical activity, healthy coping strategies, caloric restriction, and healthy eating patterns.  The risks and benefits as well as side effects of medication, such as Phenteramine or Tenuate, is discussed.  The pros and cons of suppressing appetite and boosting metabolism is counseled.  Risks of tolerance and addiction discussed.  Use of medicine will be short term.  Pt to call with any negative side effects and agrees to keep follow up appointments.   Follow Up Instructions: 2 mos   I discussed the assessment and treatment plan with the patient. The patient was provided an opportunity to ask questions and all were answered. The patient agreed with the plan and demonstrated an understanding of the instructions.   The patient was advised to call back or seek an in-person evaluation if the symptoms worsen or if the condition  fails to improve as anticipated.  I provided 12 minutes of non-face-to-face time during  this encounter.   Hoyt Koch, MD

## 2019-08-16 ENCOUNTER — Other Ambulatory Visit: Payer: Self-pay

## 2019-08-17 ENCOUNTER — Inpatient Hospital Stay: Payer: Self-pay

## 2019-08-17 ENCOUNTER — Inpatient Hospital Stay: Payer: Self-pay | Admitting: Oncology

## 2019-08-27 ENCOUNTER — Emergency Department: Payer: BC Managed Care – PPO

## 2019-08-27 ENCOUNTER — Encounter: Payer: Self-pay | Admitting: Intensive Care

## 2019-08-27 ENCOUNTER — Other Ambulatory Visit: Payer: Self-pay

## 2019-08-27 ENCOUNTER — Emergency Department
Admission: EM | Admit: 2019-08-27 | Discharge: 2019-08-27 | Disposition: A | Discharge status: Home / Self Care | Payer: BC Managed Care – PPO | Attending: Student in an Organized Health Care Education/Training Program | Admitting: Student in an Organized Health Care Education/Training Program

## 2019-08-27 DIAGNOSIS — Z7982 Long term (current) use of aspirin: Secondary | ICD-10-CM | POA: Insufficient documentation

## 2019-08-27 DIAGNOSIS — Z853 Personal history of malignant neoplasm of breast: Secondary | ICD-10-CM | POA: Insufficient documentation

## 2019-08-27 DIAGNOSIS — J45901 Unspecified asthma with (acute) exacerbation: Secondary | ICD-10-CM | POA: Diagnosis not present

## 2019-08-27 DIAGNOSIS — I69351 Hemiplegia and hemiparesis following cerebral infarction affecting right dominant side: Secondary | ICD-10-CM | POA: Insufficient documentation

## 2019-08-27 DIAGNOSIS — Z20822 Contact with and (suspected) exposure to covid-19: Secondary | ICD-10-CM | POA: Diagnosis not present

## 2019-08-27 DIAGNOSIS — Z79899 Other long term (current) drug therapy: Secondary | ICD-10-CM | POA: Insufficient documentation

## 2019-08-27 DIAGNOSIS — R0602 Shortness of breath: Secondary | ICD-10-CM | POA: Diagnosis present

## 2019-08-27 LAB — COMPREHENSIVE METABOLIC PANEL
ALT: 13 U/L (ref 0–44)
AST: 14 U/L — ABNORMAL LOW (ref 15–41)
Albumin: 3.6 g/dL (ref 3.5–5.0)
Alkaline Phosphatase: 81 U/L (ref 38–126)
Anion gap: 9 (ref 5–15)
BUN: 12 mg/dL (ref 6–20)
CO2: 27 mmol/L (ref 22–32)
Calcium: 9.1 mg/dL (ref 8.9–10.3)
Chloride: 103 mmol/L (ref 98–111)
Creatinine, Ser: 0.68 mg/dL (ref 0.44–1.00)
GFR calc Af Amer: >60 mL/min (ref >60)
GFR calc non Af Amer: >60 mL/min (ref >60)
Glucose, Bld: 105 mg/dL — ABNORMAL HIGH (ref 70–99)
Potassium: 4.1 mmol/L (ref 3.5–5.1)
Sodium: 139 mmol/L (ref 135–145)
Total Bilirubin: 0.5 mg/dL (ref 0.3–1.2)
Total Protein: 7.6 g/dL (ref 6.5–8.1)

## 2019-08-27 LAB — CBC WITH DIFFERENTIAL/PLATELET
Abs Immature Granulocytes: 0.07 10*3/uL (ref 0.00–0.07)
Basophils Absolute: 0 10*3/uL (ref 0.0–0.1)
Basophils Relative: 0 %
Eosinophils Absolute: 0.2 10*3/uL (ref 0.0–0.5)
Eosinophils Relative: 2 %
HCT: 41.4 % (ref 36.0–46.0)
Hemoglobin: 13.5 g/dL (ref 12.0–15.0)
Immature Granulocytes: 1 %
Lymphocytes Relative: 22 %
Lymphs Abs: 3.3 10*3/uL (ref 0.7–4.0)
MCH: 28 pg (ref 26.0–34.0)
MCHC: 32.6 g/dL (ref 30.0–36.0)
MCV: 85.9 fL (ref 80.0–100.0)
Monocytes Absolute: 0.5 10*3/uL (ref 0.1–1.0)
Monocytes Relative: 3 %
Neutro Abs: 10.7 10*3/uL — ABNORMAL HIGH (ref 1.7–7.7)
Neutrophils Relative %: 72 %
Platelets: 441 10*3/uL — ABNORMAL HIGH (ref 150–400)
RBC: 4.82 MIL/uL (ref 3.87–5.11)
RDW: 14.6 % (ref 11.5–15.5)
WBC: 14.8 10*3/uL — ABNORMAL HIGH (ref 4.0–10.5)
nRBC: 0 % (ref 0.0–0.2)

## 2019-08-27 LAB — RESPIRATORY PANEL BY RT PCR (FLU A&B, COVID)
Influenza A by PCR: NEGATIVE
Influenza B by PCR: NEGATIVE
SARS Coronavirus 2 by RT PCR: NEGATIVE

## 2019-08-27 LAB — POC SARS CORONAVIRUS 2 AG: SARS Coronavirus 2 Ag: NEGATIVE

## 2019-08-27 MED ORDER — IPRATROPIUM-ALBUTEROL 0.5-2.5 (3) MG/3ML IN SOLN
3.0000 mL | Freq: Once | RESPIRATORY_TRACT | Status: AC
  Administered 2019-08-27: 3 mL via RESPIRATORY_TRACT
  Filled 2019-08-27: qty 3

## 2019-08-27 MED ORDER — PREDNISONE 20 MG PO TABS: 40.0000 mg | ORAL_TABLET | Freq: Every day | ORAL | 0 refills | Status: AC

## 2019-08-27 MED ORDER — ALBUTEROL SULFATE HFA 108 (90 BASE) MCG/ACT IN AERS: 2.0000 | INHALATION_SPRAY | RESPIRATORY_TRACT | 1 refills | Status: AC | PRN

## 2019-08-27 MED ORDER — PREDNISONE 20 MG PO TABS
60.0000 mg | ORAL_TABLET | Freq: Once | ORAL | Status: AC
  Administered 2019-08-27: 60 mg via ORAL
  Filled 2019-08-27: qty 3

## 2019-08-27 MED ORDER — METHYLPREDNISOLONE SODIUM SUCC 125 MG IJ SOLR
125.0000 mg | Freq: Once | INTRAMUSCULAR | Status: AC
  Administered 2019-08-27: 125 mg via INTRAVENOUS

## 2019-08-27 MED ORDER — DOXYCYCLINE HYCLATE 100 MG PO TABS
100.0000 mg | ORAL_TABLET | Freq: Once | ORAL | Status: AC
  Administered 2019-08-27: 100 mg via ORAL
  Filled 2019-08-27: qty 1

## 2019-08-27 MED ORDER — DOXYCYCLINE HYCLATE 50 MG PO CAPS: 100.0000 mg | ORAL_CAPSULE | Freq: Two times a day (BID) | ORAL | 0 refills | Status: AC

## 2019-08-27 MED ORDER — ONDANSETRON 4 MG PO TBDP
8.0000 mg | ORAL_TABLET | Freq: Once | ORAL | Status: AC
  Administered 2019-08-27: 8 mg via ORAL
  Filled 2019-08-27: qty 2

## 2019-08-27 NOTE — ED Notes (Signed)
Patient c/o burning when pushing solu medrol. Dr. Quentin Cornwall aware.

## 2019-08-27 NOTE — ED Notes (Signed)
Patient walked in the room, pulsed ox stayed between 96%-98%. Patient was tachypneic with ambulation, but states she is breathing better. Patient c/o nausea, states she hasn't eating since yesterday and thought the PO medication made her nauseous. Dr. Quentin Cornwall aware.

## 2019-08-27 NOTE — ED Notes (Signed)
Patient is sitting up on the stretcher. No audible wheezing noted. Patient states she feels better. Patient able to speak in complete sentences without difficulty.

## 2019-08-27 NOTE — ED Provider Notes (Signed)
-----------------------------------------   4:34 PM on 08/27/2019 -----------------------------------------  I took over care of this patient from Dr. Quentin Cornwall.  Her COVID-19 test is negative.  On reassessment she is breathing comfortably with an O2 saturation in the mid to high 90s including with exertion.  The patient is tolerating p.o. and states she feels well and wants to go home.  At this time, she is stable for discharge.  She has been prescribed prednisone and doxycycline, and has albuterol at home.  Return precautions given and she expresses understanding.   Arta Silence, MD 08/27/19 [phone]

## 2019-08-27 NOTE — ED Provider Notes (Signed)
[facility] Emergency Department Provider Note    First MD Initiated Contact with Patient 08/27/19 1138     (approximate)  I have reviewed the triage vital signs and the nursing notes.   HISTORY  Chief Complaint Shortness of Breath    HPI [name] is a 49 y.o. female presents the ER in respiratory distress.  Patient with a history of asthma.  Has had recent Covid exposure.  States that symptoms became more severe this morning.  Denies any chest pain.  Markedly tachypneic but is able to speak in short phrases and is protecting her airway.  Has not been on any recent antibiotics or steroids.    Past Medical History:  Diagnosis Date  . Asthma   . Difficulty breathing   . Family history of breast cancer    BRCA testing letter sent  . Sinus complaint   . Stroke [facility])    Family History  Problem Relation Age of Onset  . Diabetes Mother   . Uterine cancer Mother   . Colon cancer Mother   . Cancer - Lung Father   . Colon cancer Father   . Diverticulitis Brother   . Hernia Brother        x2 surgeries  . Colon cancer Paternal Grandmother   . Lung cancer Paternal Grandmother    Past Surgical History:  Procedure Laterality Date  . CESAREAN SECTION  1989  . COLONOSCOPY    . COLONOSCOPY N/A 07/11/2019   Procedure: COLONOSCOPY;  Surgeon: Virgel Manifold, MD;  Location: [facility] ENDOSCOPY;  Service: Endoscopy;  Laterality: N/A;  . FOOT SURGERY Left 2013  . HERNIA REPAIR     Patient Active Problem List   Diagnosis Date Noted  . Family history of colon cancer requiring screening colonoscopy   . Polyp of colon   . Vasomotor flushing 06/20/2019  . Hemiparesis affecting right side as late effect of cerebrovascular accident (CVA) ([place]) 09/07/2018  . Imbalance 09/07/2018  . Lymphedema of both lower extremities 05/30/2018  . Moderate episode of recurrent major depressive disorder ([place]) 05/30/2018  . Pure hypercholesterolemia 05/30/2018  .  Menorrhagia with regular cycle 10/12/2017  . Morbid obesity ([place]) 01/27/2017  . History of vitamin D deficiency 01/27/2017  . Fibroids 01/27/2017  . Asthma, well controlled 01/15/2014      Prior to Admission medications   Medication Sig Start Date End Date Taking? Authorizing Provider  Albuterol Sulfate (VENTOLIN HFA IN) Inhale into the lungs as needed (for shortness of breath).     [provider]  aspirin 81 MG chewable tablet Chew 1 tablet (81 mg total) by mouth daily. 07/13/18   Gladstone Lighter, MD  atorvastatin (LIPITOR) 40 MG tablet Take 1 tablet by mouth daily.    [provider]  Cinnamon 500 MG TABS Take 2 tablets by mouth daily.    [provider]  cloNIDine (CATAPRES - DOSED IN MG/24 HR) 0.1 mg/24hr patch Place 1 patch (0.1 mg total) onto the skin once a week. 06/20/19   Gae Dry, MD  doxycycline (VIBRAMYCIN) 50 MG capsule Take 2 capsules (100 mg total) by mouth 2 (two) times daily for 7 days. 08/27/19 09/03/19  Merlyn Lot, MD  escitalopram (LEXAPRO) 5 MG tablet Take 1 tablet (5 mg total) by mouth daily. 09/13/18   Steele Sizer, MD  fluconazole (DIFLUCAN) 150 MG tablet Take 1 tablet (150 mg total) by mouth every other day. 09/19/18   Steele Sizer, MD  Fluticasone-Umeclidin-Vilant (TRELEGY  ELLIPTA) 100-62.5-25 MCG/INH AEPB Inhale into the lungs. 05/09/19   [provider]  hydrOXYzine (ATARAX/VISTARIL) 25 MG tablet TAKE 1 TABLET BY MOUTH THREE TIMES DAILY AS NEEDED FOR ITCHING 09/24/18   [provider]  JENCYCLA 0.35 MG tablet  05/16/18   [provider]  Lorcaserin HCl ER (BELVIQ XR) 20 MG TB24 Take 1 tablet by mouth daily. 09/13/18   Steele Sizer, MD  mometasone-formoterol (DULERA) 200-5 MCG/ACT AERO Inhale 2 puffs into the lungs 2 (two) times daily. 01/14/17   Erby Pian, MD  montelukast (SINGULAIR) 10 MG tablet Take 1 tablet by mouth daily. 04/22/18 04/22/19  Erby Pian, MD  Multiple  Vitamins-Calcium (ONE-A-DAY WITHIN PO) Take 1 tablet by mouth daily.    [provider]  Omega 3 1000 MG CAPS Take 1 capsule by mouth daily.    [provider]  phentermine (ADIPEX-P) 37.5 MG tablet One tablet po in morning. 07/21/19   Gae Dry, MD  predniSONE (DELTASONE) 20 MG tablet Take 2 tablets (40 mg total) by mouth daily for 5 days. 08/27/19 09/01/19  Merlyn Lot, MD  theophylline (THEODUR) 300 MG 12 hr tablet Take 300 mg by mouth daily. 06/05/19   [provider]  Tiotropium Bromide Monohydrate (SPIRIVA RESPIMAT) 1.25 MCG/ACT AERS Inhale into the lungs. 08/24/18 08/24/19  [provider]  traMADol (ULTRAM) 50 MG tablet Take 50 mg by mouth every 8 (eight) hours as needed. for pain 08/29/18   [provider]  triamcinolone cream (KENALOG) 0.1 % Apply topically. 08/28/18   [provider]  vitamin B-12 (CYANOCOBALAMIN) 1000 MCG tablet Take 1,000 mcg by mouth daily.    [provider]  Vitamin D, Ergocalciferol, (DRISDOL) 1.25 MG (50000 UT) CAPS capsule Take 1 capsule (50,000 Units total) by mouth every 7 (seven) days. For 3 months. 06/20/18   Gae Dry, MD    Allergies Advil [ibuprofen], Percocet [oxycodone-acetaminophen], and Azithromycin    Social History Social History   Tobacco Use  . Smoking status: Never Smoker  . Smokeless tobacco: Never Used  Substance Use Topics  . Alcohol use: Yes    Comment: occasionally  . Drug use: No    Review of Systems Patient denies headaches, rhinorrhea, blurry vision, numbness, shortness of breath, chest pain, edema, cough, abdominal pain, nausea, vomiting, diarrhea, dysuria, fevers, rashes or hallucinations unless otherwise stated above in HPI. ____________________________________________   PHYSICAL EXAM:  VITAL SIGNS: Vitals:   08/27/19 1400 08/27/19 1430  BP:    Pulse: 76 73  Resp:    Temp:    SpO2: 96% 97%    Constitutional: Alert and oriented.    Eyes: Conjunctivae are normal.  Head: Atraumatic. Nose: No congestion/rhinnorhea. Mouth/Throat: Mucous membranes are moist.   Neck: No stridor. Painless ROM.  Cardiovascular: Normal rate, regular rhythm. Grossly normal heart sounds.  Good peripheral circulation. Respiratory: tachypnea with use of accessory muscles.  Diffuse coarse expiratory wheeze Gastrointestinal: Soft and nontender. No distention. No abdominal bruits. No CVA tenderness. Genitourinary:  Musculoskeletal: No lower extremity tenderness nor edema.  No joint effusions. Neurologic:  Normal speech and language. No gross focal neurologic deficits are appreciated. No facial droop Skin:  Skin is warm, dry and intact. No rash noted. Psychiatric: Mood and affect are normal. Speech and behavior are normal.  ____________________________________________   LABS (all labs ordered are listed, but only abnormal results are displayed)  Results for orders placed or performed during the hospital encounter of 08/27/19 (from the past 24 hour(s))  CBC with Differential     Status: Abnormal   Collection Time: 08/27/19 11:43 AM  Result Value Ref Range   WBC 14.8 (H) 4.0 - 10.5 K/uL   RBC 4.82 3.87 - 5.11 MIL/uL   Hemoglobin 13.5 12.0 - 15.0 g/dL   HCT 41.4 36.0 - 46.0 %   MCV 85.9 80.0 - 100.0 fL   MCH 28.0 26.0 - 34.0 pg   MCHC 32.6 30.0 - 36.0 g/dL   RDW 14.6 11.5 - 15.5 %   Platelets 441 (H) 150 - 400 K/uL   nRBC 0.0 0.0 - 0.2 %   Neutrophils Relative % 72 %   Neutro Abs 10.7 (H) 1.7 - 7.7 K/uL   Lymphocytes Relative 22 %   Lymphs Abs 3.3 0.7 - 4.0 K/uL   Monocytes Relative 3 %   Monocytes Absolute 0.5 0.1 - 1.0 K/uL   Eosinophils Relative 2 %   Eosinophils Absolute 0.2 0.0 - 0.5 K/uL   Basophils Relative 0 %   Basophils Absolute 0.0 0.0 - 0.1 K/uL   Immature Granulocytes 1 %   Abs Immature Granulocytes 0.07 0.00 - 0.07 K/uL  Comprehensive metabolic panel     Status: Abnormal   Collection Time: 08/27/19 11:43 AM  Result  Value Ref Range   Sodium 139 135 - 145 mmol/L   Potassium 4.1 3.5 - 5.1 mmol/L   Chloride 103 98 - 111 mmol/L   CO2 27 22 - 32 mmol/L   Glucose, Bld 105 (H) 70 - 99 mg/dL   BUN 12 6 - 20 mg/dL   Creatinine, Ser 0.68 0.44 - 1.00 mg/dL   Calcium 9.1 8.9 - 10.3 mg/dL   Total Protein 7.6 6.5 - 8.1 g/dL   Albumin 3.6 3.5 - 5.0 g/dL   AST 14 (L) 15 - 41 U/L   ALT 13 0 - 44 U/L   Alkaline Phosphatase 81 38 - 126 U/L   Total Bilirubin 0.5 0.3 - 1.2 mg/dL   GFR calc non Af Amer >60 >60 mL/min   GFR calc Af Amer >60 >60 mL/min   Anion gap 9 5 - 15  POC SARS Coronavirus 2 Ag     Status: None   Collection Time: 08/27/19 12:55 PM  Result Value Ref Range   SARS Coronavirus 2 Ag NEGATIVE NEGATIVE   ____________________________________________  EKG My review and personal interpretation at Time: 11:38   Indication: sob  Rate: 80  Rhythm: sinus Axis: norma Other: nonspecific st abn, no stemi ____________________________________________  RADIOLOGY  I personally reviewed all radiographic images ordered to evaluate for the above acute complaints and reviewed radiology reports and findings.  These findings were personally discussed with the patient.  Please see medical record for radiology report.  ____________________________________________   PROCEDURES  Procedure(s) performed:  Procedures    Critical Care performed: no ____________________________________________   INITIAL IMPRESSION / ASSESSMENT AND PLAN / ED COURSE  Pertinent labs & imaging results that were available during my care of the patient were reviewed by me and considered in my medical decision making (see chart for details).   DDX: Asthma, copd, CHF, pna, ptx, malignancy, Pe, anemia   [name] is a 49 y.o. who presents to the ED with shortness of breath and symptoms as described above.  She is protecting her airway but markedly tachypneic with symptoms concerning for acute asthma or bronchitis.  No  hypoxia at this time.  Has had recent Covid exposure.  Will give nebulizers steroids some blood  work for above differential and reassess.  Clinical Course as of Aug 26 1456  Nancy Fetter Aug 27, 2019  1301 Patient reassessed significant improvement after nebulizer.  Will start IV did infiltrate.  Will give oral prednisone as she is turning around quite a bit.  Will observe given her significant tachypnea on arrival but she is not hypoxic at this time I suspect this is most likely bronchitis.   [PR]  1191 Patient reassessed.  Feels significantly improved.  No hypoxia at rest.  Seems a steroids have kicked and are working.  For the patient I would recommend that we observe her ensure that she is Covid negative because given her presentation and significant dyspnea do feel that she would warrant observation and treatment if she is COVID-19 positive.  If is negative would feel more comfortable treating for asthma bronchitis.  Patient agreeable to this plan.   [PR]    Clinical Course User Index [PR] Merlyn Lot, MD    The patient was evaluated in Emergency Department today for the symptoms described in the history of present illness. He/she was evaluated in the context of the global COVID-19 pandemic, which necessitated consideration that the patient might be at risk for infection with the SARS-CoV-2 virus that causes COVID-19. Institutional protocols and algorithms that pertain to the evaluation of patients at risk for COVID-19 are in a state of rapid change based on information released by regulatory bodies including the CDC and federal and state organizations. These policies and algorithms were followed during the patient's care in the ED.  As part of my medical decision making, I reviewed the following data within the [facility] notes reviewed and incorporated, Labs reviewed, notes from prior ED visits and [place] Controlled Substance  Database   ____________________________________________   FINAL CLINICAL IMPRESSION(S) / ED DIAGNOSES  Final diagnoses:  Exacerbation of asthma, unspecified asthma severity, unspecified whether persistent      NEW MEDICATIONS STARTED DURING THIS VISIT:  New Prescriptions   DOXYCYCLINE (VIBRAMYCIN) 50 MG CAPSULE    Take 2 capsules (100 mg total) by mouth 2 (two) times daily for 7 days.   PREDNISONE (DELTASONE) 20 MG TABLET    Take 2 tablets (40 mg total) by mouth daily for 5 days.     Note:  This document was prepared using Dragon voice recognition software and may include unintentional dictation errors.    Merlyn Lot, MD 08/27/19 [phone]

## 2019-08-27 NOTE — ED Triage Notes (Signed)
C/o diarrhea and emesis X2 days. SOB started Thursday and progressively worse yesterday. Audible wheezing noted. HX asthma. Recently found out around someone COVID +.

## 2019-09-21 ENCOUNTER — Ambulatory Visit: Payer: BC Managed Care – PPO | Admitting: Obstetrics & Gynecology

## 2019-10-06 ENCOUNTER — Encounter: Payer: Self-pay | Admitting: Obstetrics & Gynecology

## 2019-10-06 ENCOUNTER — Other Ambulatory Visit: Payer: Self-pay

## 2019-10-06 ENCOUNTER — Ambulatory Visit (INDEPENDENT_AMBULATORY_CARE_PROVIDER_SITE_OTHER): Payer: BC Managed Care – PPO | Admitting: Obstetrics & Gynecology

## 2019-10-06 DIAGNOSIS — Z713 Dietary counseling and surveillance: Secondary | ICD-10-CM | POA: Diagnosis not present

## 2019-10-06 DIAGNOSIS — Z6841 Body Mass Index (BMI) 40.0 and over, adult: Secondary | ICD-10-CM | POA: Diagnosis not present

## 2019-10-06 MED ORDER — PHENTERMINE HCL 37.5 MG PO TABS: ORAL_TABLET | ORAL | 1 refills | Status: DC

## 2019-10-06 NOTE — Progress Notes (Signed)
Virtual Visit via Video Note  I connected with [name] on 10/06/19 at  1:50 PM EST by a video enabled telemedicine application and verified that I am speaking with the correct person using two identifiers.  Location: Patient: Home Provider: Office   I discussed the limitations of evaluation and management by telemedicine and the availability of in person appointments. The patient expressed understanding and agreed to proceed.  History of Present Illness: [name] is a 49 y.o. who was started on Phentermine approximately 3 months ago due to obesity/abnormal weight gain. The patient has lost 8 pounds over the past 2 mos due to meds and lifestyle changes, and despite round of steroids due to asthma..   She has these side effects: none.  PMHx: She  has a past medical history of Asthma, Difficulty breathing, Family history of breast cancer, Sinus complaint, and Stroke ([place]). Also,  has a past surgical history that includes Cesarean section (1989); Hernia repair; Foot surgery (Left, 2013); Colonoscopy; and Colonoscopy (N/A, 07/11/2019)., family history includes Cancer - Lung in her father; Colon cancer in her father, mother, and paternal grandmother; Diabetes in her mother; Diverticulitis in her brother; Hernia in her brother; Lung cancer in her paternal grandmother; Uterine cancer in her mother.,  reports that she has never smoked. She has never used smokeless tobacco. She reports current alcohol use. She reports that she does not use drugs.  She has a current medication list which includes the following prescription(s): albuterol, aspirin, atorvastatin, cinnamon, clonidine, escitalopram, fluconazole, trelegy ellipta, hydroxyzine, jencycla, lorcaserin hcl er, mometasone-formoterol, multiple vitamins-calcium, omega 3, phentermine, theophylline, tramadol, triamcinolone cream, vitamin b-12, vitamin d (ergocalciferol), montelukast, and tiotropium bromide monohydrate. Also, is  allergic to advil [ibuprofen]; percocet [oxycodone-acetaminophen]; and azithromycin.  ROS    Observations/Objective: No exam today, due to telephone eVisit due to [facility] virus restriction on elective visits and procedures.  Prior visits reviewed along with ultrasounds/labs as indicated. From Home: Ht 5\' 1"  (1.549 m)   Wt (!) 302 lb (137 kg)   BMI 57.06 kg/m  Body mass index is 57.06 kg/m.  Filed Weights   10/06/19 1347  Weight: (!) 302 lb (137 kg)    Assessment and Plan:   ICD-10-CM   1. Morbid obesity (HCC)  E66.01 phentermine (ADIPEX-P) 37.5 MG tablet   Assessment: morbid obesity Medication treatment is going adequately for her.  Plan: Patient is continued/added to prescription appetite suppressants: Phentermine.   Will continue to assist patient in incorporating positive experiences into her life to promote a positive mental attitude.  Education given regarding appropriate lifestyle changes for weight loss, including regular physical activity, healthy coping strategies, caloric restriction, and healthy eating patterns.  The risks and benefits as well as side effects of medication, such as Phenteramine or Tenuate, is discussed.  The pros and cons of suppressing appetite and boosting metabolism is counseled.  Risks of tolerance and addiction discussed.  Use of medicine will be short term.  Pt to call with any negative side effects and agrees to keep follow up appointments.  Follow Up Instructions: 2 mos   I discussed the assessment and treatment plan with the patient. The patient was provided an opportunity to ask questions and all were answered. The patient agreed with the plan and demonstrated an understanding of the instructions.   The patient was advised to call back or seek an in-person evaluation if the symptoms worsen or if the condition fails to improve as anticipated.  A total of  12 minutes were spent face-to-face with the patient as well as preparation, review,  communication, and documentation during this encounter.   Barnett Applebaum, MD, [facility], [facility] 10/06/2019  2:09 PM

## 2019-10-18 ENCOUNTER — Encounter: Payer: Self-pay | Admitting: Obstetrics and Gynecology

## 2019-10-18 ENCOUNTER — Ambulatory Visit (INDEPENDENT_AMBULATORY_CARE_PROVIDER_SITE_OTHER): Payer: BC Managed Care – PPO | Admitting: Obstetrics and Gynecology

## 2019-10-18 ENCOUNTER — Other Ambulatory Visit: Payer: Self-pay

## 2019-10-18 VITALS — Ht 61.0 in | Wt 315.0 lb

## 2019-10-18 DIAGNOSIS — N6322 Unspecified lump in the left breast, upper inner quadrant: Secondary | ICD-10-CM | POA: Diagnosis not present

## 2019-10-18 DIAGNOSIS — N644 Mastodynia: Secondary | ICD-10-CM

## 2019-10-18 DIAGNOSIS — N6320 Unspecified lump in the left breast, unspecified quadrant: Secondary | ICD-10-CM

## 2019-10-18 NOTE — Progress Notes (Signed)
Steele Sizer, MD   Chief Complaint  Patient presents with  . Breast exam    knot on left breast noticed last week, sore    HPI:      [name] is a 49 y.o. G2P1011 who LMP was No LMP recorded. Patient is perimenopausal., presents today for LT breast mass for the past wk, tender to touch. No change in size since first noticed. Doing SBE since last mammo 11/20 and this is new for pt. No erythema, nipple d/c, trauma. Can feel better when sitting up. Drinks limited caffeine. No recent menses (perimenopause). FH breast cancer in her mom in her 30s/40s. Cancer genetic testing not done. Neg mammo 11/20; No hx of breast masses/surg in past.  Has annual sched 4/21.  Past Medical History:  Diagnosis Date  . Asthma   . Difficulty breathing   . Family history of breast cancer    BRCA testing letter sent  . Sinus complaint   . Stroke [facility])     Past Surgical History:  Procedure Laterality Date  . CESAREAN SECTION  1989  . COLONOSCOPY    . COLONOSCOPY N/A 07/11/2019   Procedure: COLONOSCOPY;  Surgeon: Virgel Manifold, MD;  Location: [facility] ENDOSCOPY;  Service: Endoscopy;  Laterality: N/A;  . FOOT SURGERY Left 2013  . HERNIA REPAIR      Family History  Problem Relation Age of Onset  . Diabetes Mother   . Uterine cancer Mother   . Colon cancer Mother   . Breast cancer Mother        30s/40s  . Cancer - Lung Father   . Colon cancer Father   . Diverticulitis Brother   . Hernia Brother        x2 surgeries  . Colon cancer Paternal Grandmother   . Lung cancer Paternal Grandmother     Social History   Socioeconomic History  . Marital status: Married    Spouse name: Stage manager  . Number of children: 1  . Years of education: Not on file  . Highest education level: Some college, no degree  Occupational History  . Occupation: customer services   Tobacco Use  . Smoking status: Never Smoker  . Smokeless tobacco: Never Used  Substance and Sexual Activity   . Alcohol use: Yes    Comment: occasionally  . Drug use: No  . Sexual activity: Yes    Partners: Male    Birth control/protection: Pill  Other Topics Concern  . Not on file  Social History Narrative   Married with her high school sweet heart , they had first child at age 50, they recently got married ( on their daughter's birthday 2019), she two miscarriages   Social Determinants of Health   Financial Resource Strain:   . Difficulty of Paying Living Expenses: Not on file  Food Insecurity:   . Worried About Charity fundraiser in the Last Year: Not on file  . Ran Out of Food in the Last Year: Not on file  Transportation Needs:   . Lack of Transportation (Medical): Not on file  . Lack of Transportation (Non-Medical): Not on file  Physical Activity:   . Days of Exercise per Week: Not on file  . Minutes of Exercise per Session: Not on file  Stress:   . Feeling of Stress : Not on file  Social Connections:   . Frequency of Communication with Friends and Family: Not on file  . Frequency of Social  Gatherings with Friends and Family: Not on file  . Attends Religious Services: Not on file  . Active Member of Clubs or Organizations: Not on file  . Attends Archivist Meetings: Not on file  . Marital Status: Not on file  Intimate Partner Violence:   . Fear of Current or Ex-Partner: Not on file  . Emotionally Abused: Not on file  . Physically Abused: Not on file  . Sexually Abused: Not on file    Outpatient Medications Prior to Visit  Medication Sig Dispense Refill  . albuterol (VENTOLIN HFA) 108 (90 Base) MCG/ACT inhaler Inhale 2 puffs into the lungs as needed (for shortness of breath). 8 g 1  . aspirin 81 MG chewable tablet Chew 1 tablet (81 mg total) by mouth daily. 30 tablet 0  . atorvastatin (LIPITOR) 40 MG tablet Take 1 tablet by mouth daily.    . Cinnamon 500 MG TABS Take 2 tablets by mouth daily.    . cloNIDine (CATAPRES - DOSED IN MG/24 HR) 0.1 mg/24hr patch Place  1 patch (0.1 mg total) onto the skin once a week. 4 patch 11  . escitalopram (LEXAPRO) 5 MG tablet Take 1 tablet (5 mg total) by mouth daily. 30 tablet 0  . Fluticasone-Umeclidin-Vilant (TRELEGY ELLIPTA) 100-62.5-25 MCG/INH AEPB Inhale into the lungs.    . hydrOXYzine (ATARAX/VISTARIL) 25 MG tablet TAKE 1 TABLET BY MOUTH THREE TIMES DAILY AS NEEDED FOR ITCHING    . JENCYCLA 0.35 MG tablet     . mometasone-formoterol (DULERA) 200-5 MCG/ACT AERO Inhale 2 puffs into the lungs 2 (two) times daily.    . Multiple Vitamins-Calcium (ONE-A-DAY WITHIN PO) Take 1 tablet by mouth daily.    . Omega 3 1000 MG CAPS Take 1 capsule by mouth daily.    . phentermine (ADIPEX-P) 37.5 MG tablet One tablet po in morning. 30 tablet 1  . theophylline (THEODUR) 300 MG 12 hr tablet Take 300 mg by mouth daily.    . traMADol (ULTRAM) 50 MG tablet Take 50 mg by mouth every 8 (eight) hours as needed. for pain    . triamcinolone cream (KENALOG) 0.1 % Apply topically.    . vitamin B-12 (CYANOCOBALAMIN) 1000 MCG tablet Take 1,000 mcg by mouth daily.    . Vitamin D, Ergocalciferol, (DRISDOL) 1.25 MG (50000 UT) CAPS capsule Take 1 capsule (50,000 Units total) by mouth every 7 (seven) days. For 3 months. 4 capsule 2  . montelukast (SINGULAIR) 10 MG tablet Take 1 tablet by mouth daily.    . Tiotropium Bromide Monohydrate (SPIRIVA RESPIMAT) 1.25 MCG/ACT AERS Inhale into the lungs.    . fluconazole (DIFLUCAN) 150 MG tablet Take 1 tablet (150 mg total) by mouth every other day. 3 tablet 0   No facility-administered medications prior to visit.      ROS:  Review of Systems  Constitutional: Negative for fever.  Gastrointestinal: Negative for blood in stool, constipation, diarrhea, nausea and vomiting.  Genitourinary: Negative for dyspareunia, dysuria, flank pain, frequency, hematuria, urgency, vaginal bleeding, vaginal discharge and vaginal pain.  Musculoskeletal: Negative for back pain.  Skin: Negative for rash.   BREAST:  mass/tenderness   OBJECTIVE:   Vitals:  Ht 5' 1"  (1.549 m)   Wt (!) 315 lb (142.9 kg)   BMI 59.52 kg/m   Physical Exam Vitals reviewed.  Pulmonary:     Effort: Pulmonary effort is normal.  Chest:     Breasts: Breasts are symmetrical.        Right: No inverted  nipple, mass, nipple discharge, skin change or tenderness.        Left: No inverted nipple, mass, nipple discharge, skin change or tenderness.    Musculoskeletal:        General: Normal range of motion.     Cervical back: Normal range of motion.  Skin:    General: Skin is warm and dry.  Neurological:     General: No focal deficit present.     Mental Status: She is alert and oriented to person, place, and time.     Cranial Nerves: No cranial nerve deficit.  Psychiatric:        Mood and Affect: Mood normal.        Behavior: Behavior normal.        Thought Content: Thought content normal.        Judgment: Judgment normal.     Assessment/Plan: Left breast mass - Plan: US BREAST LTD UNI LEFT INC AXILLA, MM DIAG BREAST TOMO UNI LEFT  Breast pain, left - Plan: US BREAST LTD UNI LEFT INC AXILLA, MM DIAG BREAST TOMO UNI LEFT  --No discrete mass LT breast on exam, area is tender. Check dx mammo and u/s and will f/u with results. If neg, reassurance.   Family history of breast cancer--MyRisk testing discussed and declined. F/u prn.     Return if symptoms worsen or fail to improve.  Salomon Ganser B. Dinara Lupu, PA-C 10/18/2019 10:57 AM

## 2019-10-18 NOTE — Patient Instructions (Signed)
I value your feedback and entrusting us with your care. If you get a [facility] patient survey, I would appreciate you taking the time to let us know about your experience today. Thank you!  As of July 20, 2019, your lab results will be released to your MyChart immediately, before I even have a chance to see them. Please give me time to review them and contact you if there are any abnormalities. Thank you for your patience.  

## 2019-10-30 ENCOUNTER — Other Ambulatory Visit: Payer: BC Managed Care – PPO

## 2019-12-05 ENCOUNTER — Ambulatory Visit: Payer: BC Managed Care – PPO | Admitting: Obstetrics & Gynecology

## 2020-02-06 ENCOUNTER — Telehealth: Payer: Self-pay | Admitting: Family Medicine

## 2020-02-06 NOTE — Telephone Encounter (Signed)
Please close chart

## 2020-02-09 ENCOUNTER — Ambulatory Visit: Payer: Self-pay | Admitting: Family Medicine

## 2020-02-14 ENCOUNTER — Telehealth: Payer: Self-pay | Admitting: Family Medicine

## 2020-02-14 NOTE — Telephone Encounter (Signed)
Celene Kras 02/05/2020 04:59 AM  Pt called stating that she is still needing to have a call back regarding this paperwork. Pt states that the deadline is coming and that she is needing to have this filled out. Pt is requesting a call today. Please advise.    Celene Kras 02/02/2020 10:22 AM  Pt calling again stating that she is needing an update on this paperwork. Please advise.    pls note that pt has called again and they are refaxing this form. PT request FU tomorrow   02/15/2020 to [phone]

## 2020-02-15 NOTE — Telephone Encounter (Signed)
Copied from [place] [id number]. Topic: General - Other >> Feb 15, 2020  9:19 AM Celene Kras wrote: Reason for CRM: Pt called stating that she picked up paperwork last week. She states that Suzie Portela is requesting to have the pts office notes from PCP from the 4 times pt was in the office. Please advise .  Fax# [phone] Keith Delgado   Callback# [phone]. [phone] >> Feb 15, 2020  9:41 AM Myatt, Marland Kitchen wrote: Called pt to inform her of our medical records request policy and she will try to get on her My Chart to see if she can get them. Paperwork for records to be faxed are in drawer waiting to be processed my our medical records person   Arthur Holms has already spoke to patient about her medical records.

## 2020-02-15 NOTE — Telephone Encounter (Signed)
I called the pt to let her know our office policy on medical records. I explained to her that we do not do them in house that they are done from a outside source. I also explained to her that she could use her MyChart to get what she needs. Pt states he is having trouble with her mychart. Paperwork for records are in the office drawer waiting for the medical record person to pick up.

## 2020-02-15 NOTE — Telephone Encounter (Signed)
Gave paperwork to [facility]. She will have to request medical records from outside. I asked to contact patient and let her know that this process may take awhile.

## 2020-02-28 IMAGING — MG DIGITAL SCREENING BILAT W/ TOMO W/ CAD
6 of 12 series · 6 of 36 positions shown · non-contrast
Comparison: Previous exam(s).

CLINICAL DATA: Screening.

EXAM:
DIGITAL SCREENING BILATERAL MAMMOGRAM WITH TOMO AND CAD

[R MLO synth-2D]
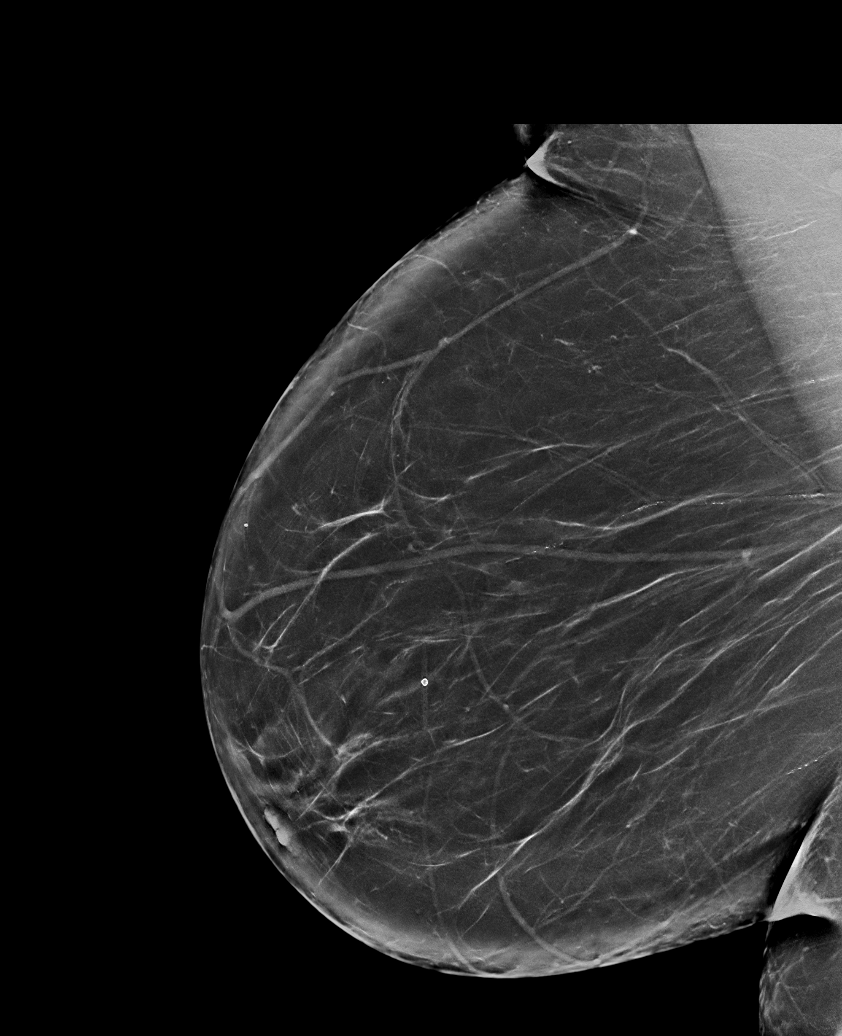

[L CC synth-2D]
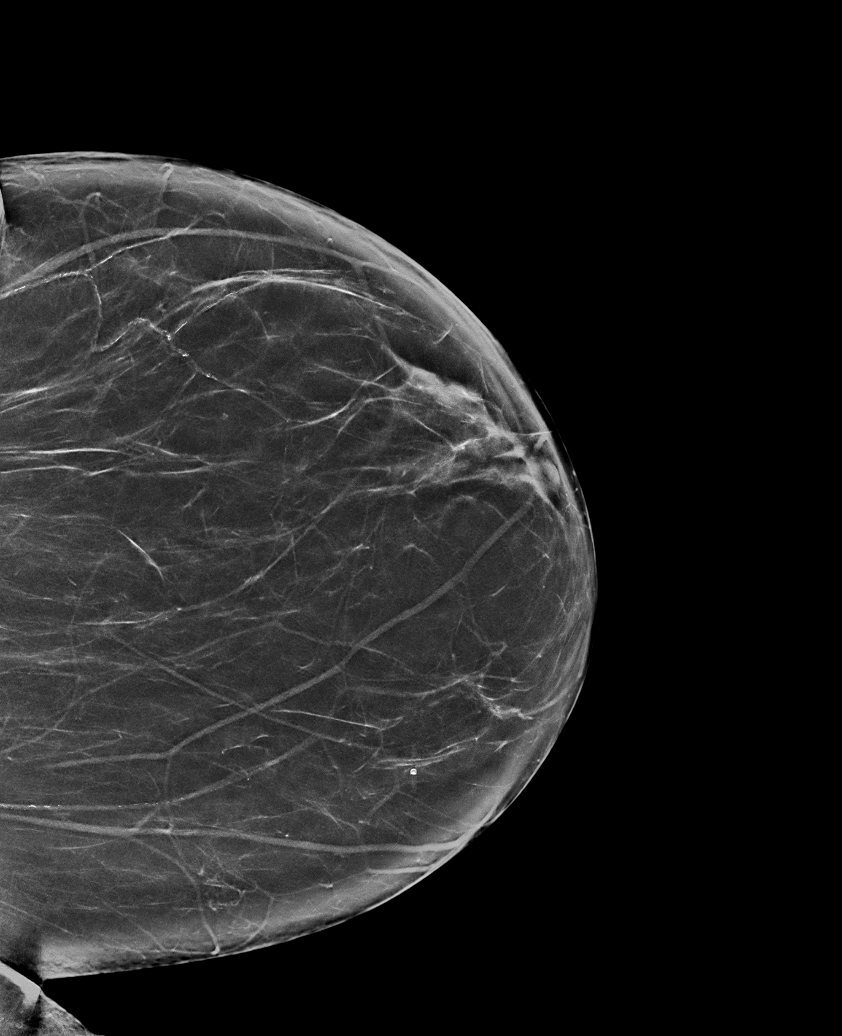

[R XCCL synth-2D]
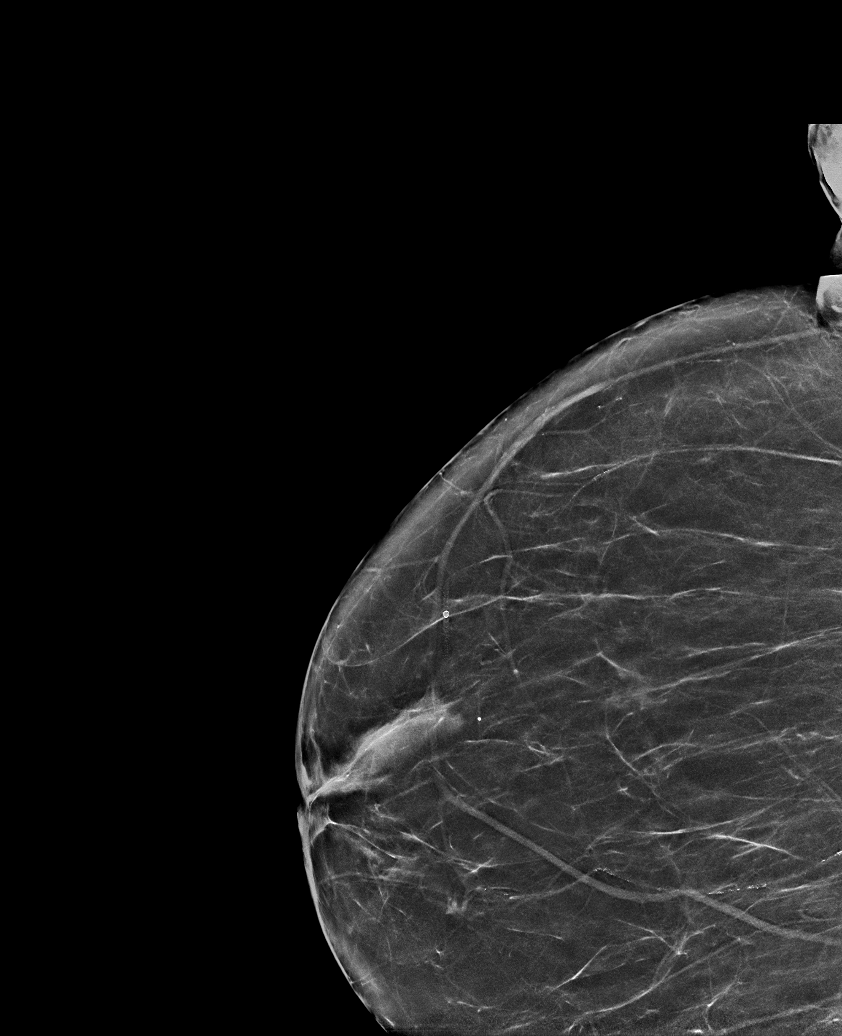

[L MLO synth-2D (1 of 2)]
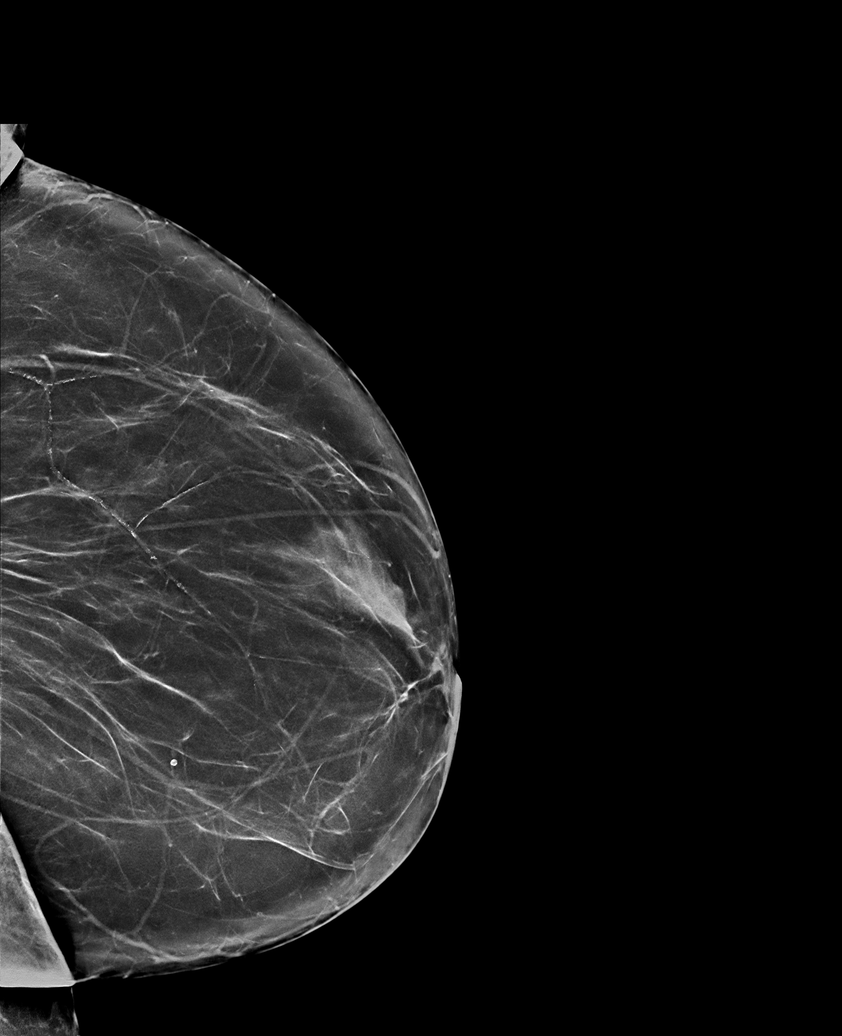

[L MLO synth-2D (2 of 2)]
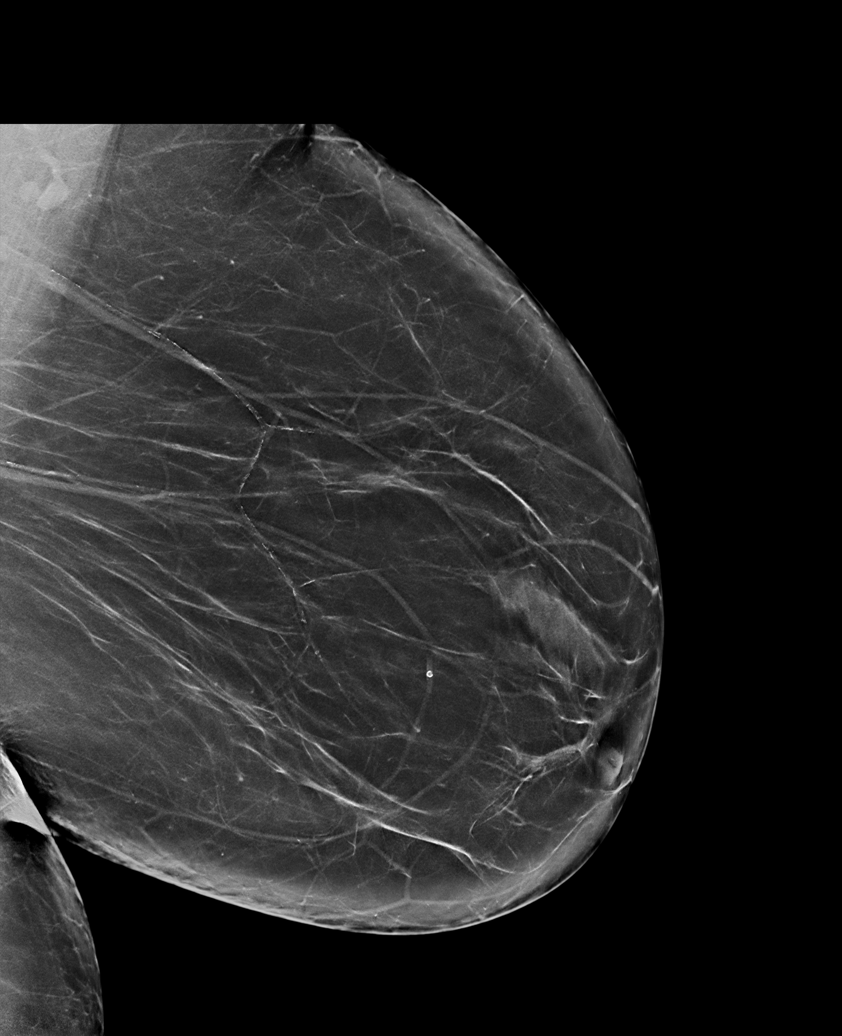

[R CC synth-2D]
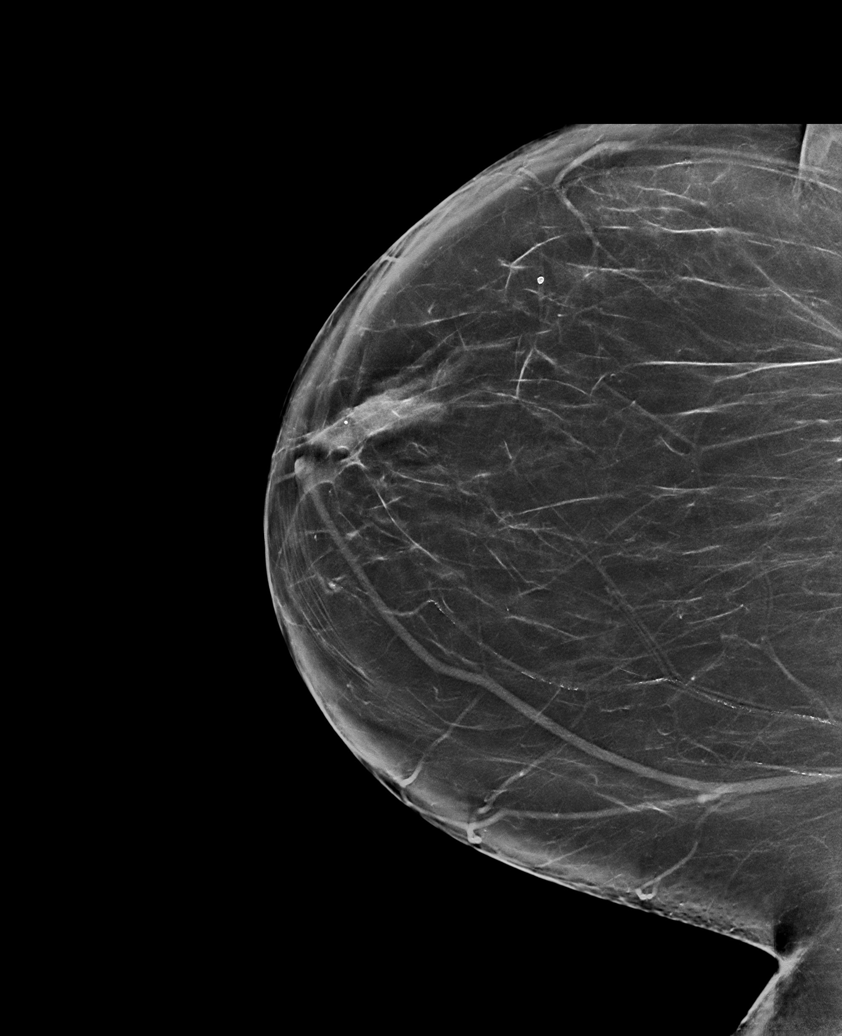

[6 of 36 positions shown; findings below may reference images not displayed]

ACR Breast Density Category b: There are scattered areas of
fibroglandular density.
FINDINGS: There are no findings suspicious for malignancy. Images were
processed with CAD.
IMPRESSION: No mammographic evidence of malignancy. A result letter of this
screening mammogram will be mailed directly to the patient.

RECOMMENDATION:
Screening mammogram in one year. (Code:[id number])

BI-RADS CATEGORY  1: Negative.

## 2020-04-13 ENCOUNTER — Emergency Department: Payer: HRSA Program

## 2020-04-13 ENCOUNTER — Inpatient Hospital Stay
Admission: EM | Admit: 2020-04-13 | Discharge: 2020-04-15 | DRG: 177 | Disposition: A | Discharge status: Home / Self Care | Payer: HRSA Program | Attending: Internal Medicine | Admitting: Internal Medicine

## 2020-04-13 ENCOUNTER — Other Ambulatory Visit: Payer: Self-pay

## 2020-04-13 DIAGNOSIS — Z8673 Personal history of transient ischemic attack (TIA), and cerebral infarction without residual deficits: Secondary | ICD-10-CM

## 2020-04-13 DIAGNOSIS — I10 Essential (primary) hypertension: Secondary | ICD-10-CM | POA: Diagnosis present

## 2020-04-13 DIAGNOSIS — J9600 Acute respiratory failure, unspecified whether with hypoxia or hypercapnia: Secondary | ICD-10-CM

## 2020-04-13 DIAGNOSIS — Z79899 Other long term (current) drug therapy: Secondary | ICD-10-CM | POA: Diagnosis not present

## 2020-04-13 DIAGNOSIS — Z86718 Personal history of other venous thrombosis and embolism: Secondary | ICD-10-CM

## 2020-04-13 DIAGNOSIS — E7800 Pure hypercholesterolemia, unspecified: Secondary | ICD-10-CM | POA: Diagnosis present

## 2020-04-13 DIAGNOSIS — I69351 Hemiplegia and hemiparesis following cerebral infarction affecting right dominant side: Secondary | ICD-10-CM

## 2020-04-13 DIAGNOSIS — J45901 Unspecified asthma with (acute) exacerbation: Secondary | ICD-10-CM

## 2020-04-13 DIAGNOSIS — E785 Hyperlipidemia, unspecified: Secondary | ICD-10-CM | POA: Diagnosis present

## 2020-04-13 DIAGNOSIS — J440 Chronic obstructive pulmonary disease with acute lower respiratory infection: Secondary | ICD-10-CM | POA: Diagnosis present

## 2020-04-13 DIAGNOSIS — B373 Candidiasis of vulva and vagina: Secondary | ICD-10-CM | POA: Diagnosis present

## 2020-04-13 DIAGNOSIS — R0902 Hypoxemia: Secondary | ICD-10-CM | POA: Diagnosis not present

## 2020-04-13 DIAGNOSIS — J9601 Acute respiratory failure with hypoxia: Secondary | ICD-10-CM | POA: Diagnosis present

## 2020-04-13 DIAGNOSIS — Z7951 Long term (current) use of inhaled steroids: Secondary | ICD-10-CM

## 2020-04-13 DIAGNOSIS — R0602 Shortness of breath: Secondary | ICD-10-CM

## 2020-04-13 DIAGNOSIS — Z7982 Long term (current) use of aspirin: Secondary | ICD-10-CM | POA: Diagnosis not present

## 2020-04-13 DIAGNOSIS — F329 Major depressive disorder, single episode, unspecified: Secondary | ICD-10-CM | POA: Diagnosis present

## 2020-04-13 DIAGNOSIS — R7301 Impaired fasting glucose: Secondary | ICD-10-CM | POA: Diagnosis not present

## 2020-04-13 DIAGNOSIS — Z6841 Body Mass Index (BMI) 40.0 and over, adult: Secondary | ICD-10-CM

## 2020-04-13 DIAGNOSIS — T380X5A Adverse effect of glucocorticoids and synthetic analogues, initial encounter: Secondary | ICD-10-CM | POA: Diagnosis not present

## 2020-04-13 DIAGNOSIS — J4541 Moderate persistent asthma with (acute) exacerbation: Secondary | ICD-10-CM | POA: Diagnosis present

## 2020-04-13 DIAGNOSIS — J1282 Pneumonia due to coronavirus disease 2019: Secondary | ICD-10-CM | POA: Diagnosis present

## 2020-04-13 DIAGNOSIS — U071 COVID-19: Principal | ICD-10-CM | POA: Diagnosis present

## 2020-04-13 LAB — COMPREHENSIVE METABOLIC PANEL
ALT: 18 U/L (ref 0–44)
AST: 31 U/L (ref 15–41)
Albumin: 3.4 g/dL — ABNORMAL LOW (ref 3.5–5.0)
Alkaline Phosphatase: 68 U/L (ref 38–126)
Anion gap: 10 (ref 5–15)
BUN: 7 mg/dL (ref 6–20)
CO2: 26 mmol/L (ref 22–32)
Calcium: 8.4 mg/dL — ABNORMAL LOW (ref 8.9–10.3)
Chloride: 103 mmol/L (ref 98–111)
Creatinine, Ser: 0.69 mg/dL (ref 0.44–1.00)
GFR calc Af Amer: >60 mL/min (ref >60)
GFR calc non Af Amer: >60 mL/min (ref >60)
Glucose, Bld: 95 mg/dL (ref 70–99)
Potassium: 4.2 mmol/L (ref 3.5–5.1)
Sodium: 139 mmol/L (ref 135–145)
Total Bilirubin: 0.6 mg/dL (ref 0.3–1.2)
Total Protein: 7.2 g/dL (ref 6.5–8.1)

## 2020-04-13 LAB — CBC WITH DIFFERENTIAL/PLATELET
Abs Immature Granulocytes: 0.01 10*3/uL (ref 0.00–0.07)
Basophils Absolute: 0 10*3/uL (ref 0.0–0.1)
Basophils Relative: 1 %
Eosinophils Absolute: 0.1 10*3/uL (ref 0.0–0.5)
Eosinophils Relative: 1 %
HCT: 42.6 % (ref 36.0–46.0)
Hemoglobin: 14 g/dL (ref 12.0–15.0)
Immature Granulocytes: 0 %
Lymphocytes Relative: 53 %
Lymphs Abs: 3.4 10*3/uL (ref 0.7–4.0)
MCH: 28.5 pg (ref 26.0–34.0)
MCHC: 32.9 g/dL (ref 30.0–36.0)
MCV: 86.6 fL (ref 80.0–100.0)
Monocytes Absolute: 0.2 10*3/uL (ref 0.1–1.0)
Monocytes Relative: 4 %
Neutro Abs: 2.7 10*3/uL (ref 1.7–7.7)
Neutrophils Relative %: 41 %
Platelets: 315 10*3/uL (ref 150–400)
RBC: 4.92 MIL/uL (ref 3.87–5.11)
RDW: 15.1 % (ref 11.5–15.5)
WBC: 6.4 10*3/uL (ref 4.0–10.5)
nRBC: 0 % (ref 0.0–0.2)

## 2020-04-13 LAB — LACTIC ACID, PLASMA: Lactic Acid, Venous: 1 mmol/L (ref 0.5–1.9)

## 2020-04-13 LAB — BRAIN NATRIURETIC PEPTIDE: B Natriuretic Peptide: 16.3 pg/mL (ref 0.0–100.0)

## 2020-04-13 LAB — SARS CORONAVIRUS 2 BY RT PCR (HOSPITAL ORDER, PERFORMED IN ~~LOC~~ HOSPITAL LAB): SARS Coronavirus 2: POSITIVE — AB

## 2020-04-13 LAB — TROPONIN I (HIGH SENSITIVITY): Troponin I (High Sensitivity): 4 ng/L (ref <18)

## 2020-04-13 MED ORDER — SODIUM CHLORIDE 0.9 % IV SOLN
200.0000 mg | Freq: Once | INTRAVENOUS | Status: AC
  Administered 2020-04-14: 200 mg via INTRAVENOUS
  Filled 2020-04-13: qty 200

## 2020-04-13 MED ORDER — BARICITINIB 2 MG PO TABS
4.0000 mg | ORAL_TABLET | Freq: Every day | ORAL | Status: DC
  Filled 2020-04-13: qty 2

## 2020-04-13 MED ORDER — PREDNISONE 20 MG PO TABS: 50.0000 mg | ORAL_TABLET | Freq: Every day | ORAL | Status: DC

## 2020-04-13 MED ORDER — METHYLPREDNISOLONE SODIUM SUCC 125 MG IJ SOLR
125.0000 mg | Freq: Once | INTRAMUSCULAR | Status: AC
  Administered 2020-04-13: 125 mg via INTRAVENOUS
  Filled 2020-04-13: qty 2

## 2020-04-13 MED ORDER — HYDROCOD POLST-CPM POLST ER 10-8 MG/5ML PO SUER: 5.0000 mL | Freq: Two times a day (BID) | ORAL | Status: DC | PRN

## 2020-04-13 MED ORDER — SODIUM CHLORIDE 0.9 % IV SOLN
1.0000 mg/kg | Freq: Two times a day (BID) | INTRAVENOUS | Status: DC
  Administered 2020-04-14: 140 mg via INTRAVENOUS
  Filled 2020-04-13 (×2): qty 1.12

## 2020-04-13 MED ORDER — ALBUTEROL SULFATE (2.5 MG/3ML) 0.083% IN NEBU
10.0000 mg | INHALATION_SOLUTION | Freq: Once | RESPIRATORY_TRACT | Status: AC
  Administered 2020-04-13: 10 mg via RESPIRATORY_TRACT
  Filled 2020-04-13: qty 12

## 2020-04-13 MED ORDER — IOHEXOL 350 MG/ML SOLN
100.0000 mL | Freq: Once | INTRAVENOUS | Status: AC | PRN
  Administered 2020-04-13: 100 mL via INTRAVENOUS

## 2020-04-13 MED ORDER — IPRATROPIUM BROMIDE 0.02 % IN SOLN
0.5000 mg | Freq: Once | RESPIRATORY_TRACT | Status: AC
  Administered 2020-04-13: 0.5 mg via RESPIRATORY_TRACT
  Filled 2020-04-13: qty 2.5

## 2020-04-13 MED ORDER — GUAIFENESIN-DM 100-10 MG/5ML PO SYRP
10.0000 mL | ORAL_SOLUTION | ORAL | Status: DC | PRN
  Filled 2020-04-13: qty 10

## 2020-04-13 MED ORDER — SODIUM CHLORIDE 0.9 % IV SOLN
100.0000 mg | Freq: Every day | INTRAVENOUS | Status: DC
  Administered 2020-04-14 – 2020-04-15 (×2): 100 mg via INTRAVENOUS
  Filled 2020-04-13: qty 20
  Filled 2020-04-13: qty 100

## 2020-04-13 NOTE — ED Notes (Signed)
Green top resent to lab 

## 2020-04-13 NOTE — ED Notes (Signed)
Pt in CT- CT notified of positive covid test.

## 2020-04-13 NOTE — Consult Note (Signed)
Remdesivir - Pharmacy Brief Note   O:  ALT: 18 CXR: "Hyperinflation and peribronchial thickening, consistent with asthma. No focal airspace disease" SpO2: Hypoxic requiring supplemental oxygen   A/P:  04/13/20 SARS-CoV-2 PCR (+)  Remdesivir 200 mg IVPB once followed by 100 mg IVPB daily x 4 days.   Benita Gutter 04/13/2020 11:30 PM

## 2020-04-13 NOTE — ED Provider Notes (Signed)
[facility] Emergency Department Provider Note ____________________________________________   First MD Initiated Contact with Patient 04/13/20 2050     (approximate)  I have reviewed the triage vital signs and the nursing notes.  HISTORY  Chief Complaint Shortness of Breath   HPI [name] is a 49 y.o. femalewho presents to the ED for evaluation of shortness of breath.   Chart review indicates hx asthma Follows with [facility] clinic pulmonology, Dr. Raul Del.  Last seen on 03/19/2020, where she was prescribed prednisone and Ceftin.    Patient reports 2 days of progressively worsening shortness of breath, minimally productive cough and spiking fevers at home.  She denies being vaccinated for COVID-19.  She reports minimal transient improvement of her respiratory symptoms with her home inhalers.  She denies any associated chest pain, syncope or productive cough.  Patient additionally is reporting about 4 days of left lower abdominal pain with associated constipation and nausea.  She reports 5/10 intensity aching and cramping abdominal pain that was constant and reminds her of constipation.  She reports taking stool softeners with passage of stool and no change of her symptoms.  Reports voiding at baseline without dysuria or hematuria.  Denies vaginal bleeding or discharge.  Denies vomiting.  Surgical history of cesarean section and hernia repair.    Past Medical History:  Diagnosis Date  . Asthma   . Difficulty breathing   . Family history of breast cancer    BRCA testing letter sent  . Sinus complaint   . Stroke [facility])     Patient Active Problem List   Diagnosis Date Noted  . Family history of colon cancer requiring screening colonoscopy   . Polyp of colon   . Vasomotor flushing 06/20/2019  . Hemiparesis affecting right side as late effect of cerebrovascular accident (CVA) ([place]) 09/07/2018  . Imbalance 09/07/2018  . Lymphedema of both lower  extremities 05/30/2018  . Moderate episode of recurrent major depressive disorder ([place]) 05/30/2018  . Pure hypercholesterolemia 05/30/2018  . Menorrhagia with regular cycle 10/12/2017  . Morbid obesity ([place]) 01/27/2017  . History of vitamin D deficiency 01/27/2017  . Fibroids 01/27/2017  . Asthma, well controlled 01/15/2014    Past Surgical History:  Procedure Laterality Date  . CESAREAN SECTION  1989  . COLONOSCOPY    . COLONOSCOPY N/A 07/11/2019   Procedure: COLONOSCOPY;  Surgeon: Virgel Manifold, MD;  Location: [facility] ENDOSCOPY;  Service: Endoscopy;  Laterality: N/A;  . FOOT SURGERY Left 2013  . HERNIA REPAIR      Prior to Admission medications   Medication Sig Start Date End Date Taking? Authorizing Provider  albuterol (VENTOLIN HFA) 108 (90 Base) MCG/ACT inhaler Inhale 2 puffs into the lungs as needed (for shortness of breath). 08/27/19   Merlyn Lot, MD  aspirin 81 MG chewable tablet Chew 1 tablet (81 mg total) by mouth daily. 07/13/18   Gladstone Lighter, MD  atorvastatin (LIPITOR) 40 MG tablet Take 1 tablet by mouth daily.    [provider]  Cinnamon 500 MG TABS Take 2 tablets by mouth daily.    [provider]  cloNIDine (CATAPRES - DOSED IN MG/24 HR) 0.1 mg/24hr patch Place 1 patch (0.1 mg total) onto the skin once a week. 06/20/19   Gae Dry, MD  escitalopram (LEXAPRO) 5 MG tablet Take 1 tablet (5 mg total) by mouth daily. 09/13/18   Steele Sizer, MD  Fluticasone-Umeclidin-Vilant (TRELEGY ELLIPTA) 100-62.5-25 MCG/INH AEPB Inhale into the lungs. 05/09/19  [provider]  hydrOXYzine (ATARAX/VISTARIL) 25 MG tablet TAKE 1 TABLET BY MOUTH THREE TIMES DAILY AS NEEDED FOR ITCHING 09/24/18   [provider]  JENCYCLA 0.35 MG tablet  05/16/18   [provider]  mometasone-formoterol (DULERA) 200-5 MCG/ACT AERO Inhale 2 puffs into the lungs 2 (two) times daily. 01/14/17   Erby Pian, MD  montelukast (SINGULAIR) 10 MG  tablet Take 1 tablet by mouth daily. 04/22/18 04/22/19  Erby Pian, MD  Multiple Vitamins-Calcium (ONE-A-DAY WITHIN PO) Take 1 tablet by mouth daily.    [provider]  Omega 3 1000 MG CAPS Take 1 capsule by mouth daily.    [provider]  phentermine (ADIPEX-P) 37.5 MG tablet One tablet po in morning. 10/06/19   Gae Dry, MD  theophylline (THEODUR) 300 MG 12 hr tablet Take 300 mg by mouth daily. 06/05/19   [provider]  Tiotropium Bromide Monohydrate (SPIRIVA RESPIMAT) 1.25 MCG/ACT AERS Inhale into the lungs. 08/24/18 08/24/19  [provider]  traMADol (ULTRAM) 50 MG tablet Take 50 mg by mouth every 8 (eight) hours as needed. for pain 08/29/18   [provider]  triamcinolone cream (KENALOG) 0.1 % Apply topically. 08/28/18   [provider]  vitamin B-12 (CYANOCOBALAMIN) 1000 MCG tablet Take 1,000 mcg by mouth daily.    [provider]  Vitamin D, Ergocalciferol, (DRISDOL) 1.25 MG (50000 UT) CAPS capsule Take 1 capsule (50,000 Units total) by mouth every 7 (seven) days. For 3 months. 06/20/18   Gae Dry, MD    Allergies Advil [ibuprofen], Percocet [oxycodone-acetaminophen], and Azithromycin  Family History  Problem Relation Age of Onset  . Diabetes Mother   . Uterine cancer Mother   . Colon cancer Mother   . Breast cancer Mother        30s/40s  . Cancer - Lung Father   . Colon cancer Father   . Diverticulitis Brother   . Hernia Brother        x2 surgeries  . Colon cancer Paternal Grandmother   . Lung cancer Paternal Grandmother     Social History Social History   Tobacco Use  . Smoking status: Never Smoker  . Smokeless tobacco: Never Used  Vaping Use  . Vaping Use: Never used  Substance Use Topics  . Alcohol use: Yes    Comment: occasionally  . Drug use: No    Review of Systems  Constitutional: Positive for fevers and chills. Eyes: No visual changes. ENT: No sore  throat. Cardiovascular: Denies chest pain. Respiratory: Positive for shortness of breath and nonproductive cough. Gastrointestinal: Positive for abdominal pain and nausea no vomiting.  No diarrhea.  No constipation. Genitourinary: Negative for dysuria. Musculoskeletal: Negative for back pain. Skin: Negative for rash. Neurological: Negative for headaches, focal weakness or numbness.   ____________________________________________   PHYSICAL EXAM:  VITAL SIGNS: Vitals:   04/13/20 2200 04/13/20 2215  BP:    Pulse: 99 98  Resp: 16 16  Temp:    SpO2: 99% 98%      Constitutional: Alert and oriented.  Obese.  Uncomfortable-appearing sitting up in bed on nasal cannula.  Speaking in short phrases only. Eyes: Conjunctivae are normal. PERRL. EOMI. Head: Atraumatic. Nose: No congestion/rhinnorhea. Mouth/Throat: Mucous membranes are dry.  Oropharynx non-erythematous. Neck: No stridor. No cervical spine tenderness to palpation. Cardiovascular: Normal rate, regular rhythm. Grossly normal heart sounds.  Good peripheral circulation. Respiratory: Tachypneic to the upper 20s without retractions.  Diffuse expiratory wheezes with poor  air movement throughout. Gastrointestinal: Soft , nondistended. No abdominal bruits. No CVA tenderness. LLQ tenderness to palpation with voluntary guarding. Musculoskeletal: No lower extremity tenderness nor edema.  No joint effusions. No signs of acute trauma. Neurologic:  Normal speech and language. No gross focal neurologic deficits are appreciated. No gait instability noted. Skin:  Skin is warm, dry and intact. No rash noted. Psychiatric: Mood and affect are normal. Speech and behavior are normal.  ____________________________________________   LABS (all labs ordered are listed, but only abnormal results are displayed)  Labs Reviewed  SARS CORONAVIRUS 2 BY RT PCR (HOSPITAL ORDER, [facility]) - Abnormal; Notable for the following  components:      Result Value   SARS Coronavirus 2 POSITIVE (*)    All other components within normal limits  COMPREHENSIVE METABOLIC PANEL - Abnormal; Notable for the following components:   Calcium 8.4 (*)    Albumin 3.4 (*)    All other components within normal limits  CULTURE, BLOOD (ROUTINE X 2)  CULTURE, BLOOD (ROUTINE X 2)  EXPECTORATED SPUTUM ASSESSMENT W REFEX TO RESP CULTURE  CBC WITH DIFFERENTIAL/PLATELET  BRAIN NATRIURETIC PEPTIDE  LACTIC ACID, PLASMA  URINALYSIS, COMPLETE (UACMP) WITH MICROSCOPIC  HIV ANTIBODY (ROUTINE TESTING W REFLEX)  C-REACTIVE PROTEIN  FERRITIN  FIBRINOGEN  LACTATE DEHYDROGENASE  PROCALCITONIN  FIBRIN DERIVATIVES D-DIMER ([facility] ONLY)  POC URINE PREG, ED  TROPONIN I (HIGH SENSITIVITY)  TROPONIN I (HIGH SENSITIVITY)   ____________________________________________  12 Lead EKG  Sinus rhythm rate of 93 bpm, normal axis and intervals.  No evidence of acute ischemia. ____________________________________________  RADIOLOGY  ED MD interpretation: CXR reviewed with hyperinflation.  Official radiology report(s): CT ABDOMEN PELVIS W CONTRAST  Result Date: 04/13/2020 CLINICAL DATA:  Left lower quadrant abdominal pain, fever EXAM: CT ABDOMEN AND PELVIS WITH CONTRAST TECHNIQUE: Multidetector CT imaging of the abdomen and pelvis was performed using the standard protocol following bolus administration of intravenous contrast. CONTRAST:  168m OMNIPAQUE IOHEXOL 350 MG/ML SOLN COMPARISON:  11/02/2017 FINDINGS: Lower chest: Patchy pulmonary infiltrates are noted at the lung bases bilaterally, likely infectious or inflammatory in nature and compatible with though seen with COVID-19 pneumonia. The visualized heart and pericardium are unremarkable. Hepatobiliary: Tiny probable hepatic cysts. Liver and gallbladder are otherwise unremarkable. No intra or extrahepatic biliary ductal dilation. 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 The spleen is normal in size. Adrenals/Urinary Tract: Adrenal glands and kidneys are normal. Bladder is unremarkable. Stomach/Bowel: Bladder and small bowel are unremarkable. Mild sigmoid diverticulosis without superimposed inflammatory change. The large bowel is otherwise unremarkable. Appendix normal. No free intraperitoneal gas or fluid. Vascular/Lymphatic: No pathologic adenopathy within the abdomen and pelvis. Circumaortic left renal vein. Abdominal vasculature is otherwise normal. Reproductive: Uterus and bilateral adnexa are unremarkable. Other: Rectum unremarkable Musculoskeletal: Degenerative changes are seen within the lumbar spine, particularly at L4-5 with synovial hypertrophy involving the facets and minimal anterolisthesis. No acute bone abnormality. IMPRESSION: 1. Patchy pulmonary infiltrates at the lung bases bilaterally, likely infectious or inflammatory in nature and compatible with COVID-19 pneumonia. 2. Mild sigmoid diverticulosis without superimposed inflammatory change. No definite radiographic explanation for the patient's reported left lower quadrant abdominal pain. Electronically Signed   By: AFidela SalisburyMD   On: 04/13/2020 23:01   DG Chest Portable 1 View  Result Date: 04/13/2020 CLINICAL DATA:  Fever and shortness of breath. Wheezing. Hypoxic asthma exacerbation  with fever. EXAM: PORTABLE CHEST 1 VIEW COMPARISON:  Radiograph 08/27/2019 FINDINGS: Again seen hyperinflation and peribronchial thickening. Stable heart size and mediastinal contours. No focal airspace disease. No pneumothorax or large pleural effusion. No acute osseous abnormalities are seen. IMPRESSION: Hyperinflation and peribronchial thickening, consistent with asthma. No focal airspace disease. Electronically Signed   By: Keith Rake M.D.   On: 04/13/2020 21:30     ____________________________________________   PROCEDURES and INTERVENTIONS  Procedure(s) performed (including Critical Care):  Procedures  Medications  remdesivir 200 mg in sodium chloride 0.9% 250 mL IVPB (has no administration in time range)    Followed by  remdesivir 100 mg in sodium chloride 0.9 % 100 mL IVPB (has no administration in time range)  baricitinib (OLUMIANT) tablet 4 mg (has no administration in time range)  methylPREDNISolone sodium succinate (SOLU-MEDROL) 140 mg in sodium chloride 0.9 % 50 mL IVPB (has no administration in time range)    Followed by  predniSONE (DELTASONE) tablet 50 mg (has no administration in time range)  guaiFENesin-dextromethorphan (ROBITUSSIN DM) 100-10 MG/5ML syrup 10 mL (has no administration in time range)  chlorpheniramine-HYDROcodone (TUSSIONEX) 10-8 MG/5ML suspension 5 mL (has no administration in time range)  albuterol (PROVENTIL) (2.5 MG/3ML) 0.083% nebulizer solution 10 mg (10 mg Nebulization Given 04/13/20 2111)  ipratropium (ATROVENT) nebulizer solution 0.5 mg (0.5 mg Nebulization Given 04/13/20 2111)  methylPREDNISolone sodium succinate (SOLU-MEDROL) 125 mg/2 mL injection 125 mg (125 mg Intravenous Given 04/13/20 2110)  iohexol (OMNIPAQUE) 350 MG/ML injection 100 mL (100 mLs Intravenous Contrast Given 04/13/20 2229)    ____________________________________________   MDM / ED COURSE  49 year old female with moderately persistent asthma presenting short of breath and hypoxic with COVID-19 requiring medical admission.  Patient hypoxic on room air and requiring up to 6 L nasal cannula for normoxia, and she is otherwise hemodynamically stable.  Exam demonstrates an uncomfortable-appearing dyspneic and tachypneic patient who has poor air movement on auscultation and diffuse expiratory wheezes.  Blood work really looks quite well without evidence of acute derangements.  Her Covid swab returns positive, suspected.  Due to complaints of  significant abdominal pain, and her tenderness with voluntary guarding on my examination, CT imaging of her abdomen/pelvis obtained and demonstrates diverticulosis without diverticulitis.  Steroids and remdesivir ordered.  Will admit the patient to hospitalist medicine for further work-up and management of her hypoxic Covid illness.  Clinical Course as of Apr 13 2332  Sat Apr 13, 2020  2159 Reassessed.  Patient reports improving pulmonary/respiratory symptoms, though reports persistent abdominal pain.  Reevaluation of her pulmonary exam with auscultation reveals improved air movement but persistent wheezing.  She has a continued oxygen requirement.  I advised her of plan for admission for asthma exacerbation, regardless of Covid and CT imaging results, and she is agreeable to stay.   [DS]  2332 Educated patient on diagnosis of COVID-19 and diverticulosis.   [DS]    Clinical Course User Index [DS] Vladimir Crofts, MD     ____________________________________________   FINAL CLINICAL IMPRESSION(S) / ED DIAGNOSES  Final diagnoses:  COVID-19  Shortness of breath  Hypoxia  Moderate persistent asthma with exacerbation     ED Discharge Orders    None       Michell Kader Tamala Julian   Note:  This document was prepared using Dragon voice recognition software and may include unintentional dictation errors.   Vladimir Crofts, MD 04/13/20 [phone]

## 2020-04-13 NOTE — ED Notes (Signed)
Pt reports improvement in WOB. Lung sounds clear bilaterally.

## 2020-04-13 NOTE — ED Notes (Signed)
Pt in resp distress in triage, pox on ra of 80%, hr 136, resps 40. Pt with retractions noted, wheezing, but is able to speak in full sentences. Pt taken to room 3, placed on 6lpm oxygen via [place] with improvement in pox, but continues to wheeze. md notified, to bedside.

## 2020-04-13 NOTE — ED Notes (Addendum)
Pt c/o lower abdominal pain in addition to respiratory complaint see triage note, she denies N/V/D. Abdomen is round, obese, bowel sounds present in all four quadrants.

## 2020-04-13 NOTE — H&P (Signed)
[name] BDZ:[id number] DOB: 03-Jun-1971 DOA: 04/13/2020     PCP: Steele Sizer, MD   Outpatient Specialists:    Pulmonary Dr. Minerva Areola   Patient arrived to ER on 04/13/20 at 2024 Referred by Attending Vladimir Crofts, MD   Patient coming from: home Lives  With family   Chief Complaint:   Chief Complaint  Patient presents with  . Shortness of Breath    HPI: [name] is a 49 y.o. female with medical history significant ofAshtma, TIA, obesity, history of DVT, history of peptic ulcer    Presented with increased shortness of breath and fevers for the past 2 days   respiratory distress while in triage on room air patient was satting 80% heart rate in 130s respirations in the 40s wheezing unable to speak in full sentences Patient also reporting significant abdominal pain   on 10 August she was seen for cough sputum loose stools shortness of breath and headache she tested negative at the time she was treated with steroids and ABX Patient has hard to control asthma Pulmonology was trying to start her on dupilumab she was approved but has not had the medication yet  Patient not vaccinated for Covid. Her asthma is usually fairly well controlled she has been followed by pulmonology  Infectious risk factors:  Reports shortness of breath, dry cough,   Has  NOt been vaccinated against COVID (had a bad reaction to flu vaccine and does no want it)   Initial COVID TEST    POSITIVE,     Lab Results  Component Value Date   [place] (A) 04/13/2020   [place] NEGATIVE 08/27/2019   [place] NEGATIVE 07/07/2019   Regarding pertinent Chronic problems:     Hyperlipidemia -  on statins Lipitor Lipid Panel     Component Value Date/Time   CHOL 272 (H) 05/30/2018 1645   TRIG 82 07/12/2018 2057   HDL 90 05/30/2018 1645   CHOLHDL 3.0 05/30/2018 1645   LDLCALC 161 (H) 05/30/2018 1645     HTN on Clonidine    Morbid obesity-   BMI Readings  from Last 1 Encounters:  04/13/20 56.87 kg/m      Asthma -well   controlled on home inhalers/ nebs f                      Hx of CVA -  With  residual deficits   While in ER: Patient started on 6 L nasal cannula    Hospitalist was called for admission for COVID infection  The following Work up has been ordered so far:  Orders Placed This Encounter  Procedures  . SARS Coronavirus 2 by RT PCR (hospital order, performed in [facility] hospital lab) Nasopharyngeal Nasopharyngeal Swab  . DG Chest Portable 1 View  . CT ABDOMEN PELVIS W CONTRAST  . CBC with Differential  . Brain natriuretic peptide  . Lactic acid, plasma  . Urinalysis, Complete w Microscopic  . Comprehensive metabolic panel  . Consult to hospitalist  ALL PATIENTS BEING ADMITTED/HAVING PROCEDURES NEED COVID-19 SCREENING  . POC Urine Pregnancy, ED  . EKG 12-Lead    Following Medications were ordered in ER: Medications  albuterol (PROVENTIL) (2.5 MG/3ML) 0.083% nebulizer solution 10 mg (10 mg Nebulization Given 04/13/20 2111)  ipratropium (ATROVENT) nebulizer solution 0.5 mg (0.5 mg Nebulization Given 04/13/20 2111)  methylPREDNISolone sodium succinate (SOLU-MEDROL) 125 mg/2 mL injection 125 mg (125 mg Intravenous Given 04/13/20 2110)  iohexol (  OMNIPAQUE) 350 MG/ML injection 100 mL (100 mLs Intravenous Contrast Given 04/13/20 2229)        Consult Orders  (From admission, onward)         Start     Ordered   04/13/20 2256  Consult to hospitalist  ALL PATIENTS BEING ADMITTED/HAVING PROCEDURES NEED COVID-19 SCREENING  Once       Comments: ALL PATIENTS BEING ADMITTED/HAVING PROCEDURES NEED COVID-19 SCREENING  Provider:  (Not yet assigned)  Question Answer Comment  Place call to: hospitalist   Reason for Consult Admit   Diagnosis/Clinical Info for Consult: covid Room 3. Charlsie Quest [id number]     04/13/20 2255          Significant initial  Findings: Abnormal Labs Reviewed  SARS CORONAVIRUS 2 BY RT PCR ([facility]) - Abnormal; Notable for the following components:      Result Value   SARS Coronavirus 2 POSITIVE (*)    All other components within normal limits  COMPREHENSIVE METABOLIC PANEL - Abnormal; Notable for the following components:   Calcium 8.4 (*)    Albumin 3.4 (*)    All other components within normal limits     Otherwise labs showing:    Recent Labs  Lab 04/13/20 2125  NA 139  K 4.2  CO2 26  GLUCOSE 95  BUN 7  CREATININE 0.69  CALCIUM 8.4*    Cr    Stable,  Lab Results  Component Value Date   CREATININE 0.69 04/13/2020   CREATININE 0.68 08/27/2019   CREATININE 0.84 09/07/2018    Recent Labs  Lab 04/13/20 2125  AST 31  ALT 18  ALKPHOS 68  BILITOT 0.6  PROT 7.2  ALBUMIN 3.4*   Lab Results  Component Value Date   CALCIUM 8.4 (L) 04/13/2020    WBC      Component Value Date/Time   WBC 6.4 04/13/2020 2103   ANC    Component Value Date/Time   NEUTROABS 2.7 04/13/2020 2103   NEUTROABS 16.4 (H) 09/11/2014 0356   ALC No components found for: LYMPHAB    Plt: Lab Results  Component Value Date   PLT 315 04/13/2020     Lactic Acid, Venous    Component Value Date/Time   LATICACIDVEN 1.0 04/13/2020 2103    Procalcitonin  Ordered   COVID-19 Labs  Recent Labs    04/14/20 0018  FERRITIN 73  LDH 279*    Lab Results  Component Value Date   SARSCOV2NAA POSITIVE (A) 04/13/2020   SARSCOV2NAA NEGATIVE 08/27/2019   [place] NEGATIVE 07/07/2019      HG/HCT  stable,      Component Value Date/Time   HGB 14.0 04/13/2020 2103   HGB 13.5 09/11/2014 0356   HCT 42.6 04/13/2020 2103   HCT 40.7 09/11/2014 0356      Troponin 4       ECG: Ordered Personally reviewed by me showing: HR : 93 Rhythm: NSR,  no evidence of ischemic changes QTC 435   BNP (last 3 results) Recent Labs    04/13/20 2103  BNP 16.3    UA  not ordered   Ordered    CXR -  NON acute  CTabd/pelvis - COVID PNA  ED Triage  Vitals  Enc Vitals Group     BP 04/13/20 2056 131/86     Pulse Rate 04/13/20 2054 98     Resp 04/13/20 2054 (!) 25     Temp 04/13/20 2058 99.5  F (37.5 C)     Temp Source 04/13/20 2058 Oral     SpO2 04/13/20 2054 97 %     Weight 04/13/20 2056 (!) 301 lb (136.5 kg)     Height --      Head Circumference --      Peak Flow --      Pain Score 04/13/20 2056 4     Pain Loc --      Pain Edu? --      Excl. in [place]? --   TMAX(24)@       Latest  Blood pressure 137/85, pulse 98, temperature 99.5 F (37.5 C), temperature source Oral, resp. rate 16, weight (!) 136.5 kg, SpO2 98 %.   Review of Systems:    Pertinent positives include: Fevers, chills,  Constitutional:  No weight loss, night sweats, fatigue, weight loss  HEENT:  No headaches, Difficulty swallowing,Tooth/dental problems,Sore throat,  No sneezing, itching, ear ache, nasal congestion, post nasal drip,  Cardio-vascular:  No chest pain, Orthopnea, PND, anasarca, dizziness, palpitations.no Bilateral lower extremity swelling  GI:  No heartburn, indigestion, abdominal pain, nausea, vomiting, diarrhea, change in bowel habits, loss of appetite, melena, blood in stool, hematemesis Resp:  no shortness of breath at rest. No dyspnea on exertion, No excess mucus, no productive cough, No non-productive cough, No coughing up of blood.No change in color of mucus.No wheezing. Skin:  no rash or lesions. No jaundice GU:  no dysuria, change in color of urine, no urgency or frequency. No straining to urinate.  No flank pain.  Musculoskeletal:  No joint pain or no joint swelling. No decreased range of motion. No back pain.  Psych:  No change in mood or affect. No depression or anxiety. No memory loss.  Neuro: no localizing neurological complaints, no tingling, no weakness, no double vision, no gait abnormality, no slurred speech, no confusion  All systems reviewed and apart from [place] all are negative  Past Medical History:   Past Medical  History:  Diagnosis Date  . Asthma   . Difficulty breathing   . Family history of breast cancer    BRCA testing letter sent  . Sinus complaint   . Stroke [facility])      Past Surgical History:  Procedure Laterality Date  . CESAREAN SECTION  1989  . COLONOSCOPY    . COLONOSCOPY N/A 07/11/2019   Procedure: COLONOSCOPY;  Surgeon: Virgel Manifold, MD;  Location: [facility] ENDOSCOPY;  Service: Endoscopy;  Laterality: N/A;  . FOOT SURGERY Left 2013  . HERNIA REPAIR      Social History:  Ambulatory  independently    reports that she has never smoked. She has never used smokeless tobacco. She reports current alcohol use. She reports that she does not use drugs.   Family History:  Family History  Problem Relation Age of Onset  . Diabetes Mother   . Uterine cancer Mother   . Colon cancer Mother   . Breast cancer Mother        30s/40s  . Cancer - Lung Father   . Colon cancer Father   . Diverticulitis Brother   . Hernia Brother        x2 surgeries  . Colon cancer Paternal Grandmother   . Lung cancer Paternal Grandmother     Allergies: Allergies  Allergen Reactions  . Advil [Ibuprofen] Other (See Comments)    Other Reaction: Other reaction-swells  . Percocet [Oxycodone-Acetaminophen] Nausea And Vomiting and Other (See Comments)    headaches  .  Azithromycin Rash    PATIENT UNSURE IF RASH WAS ACTUALLY DUE TO ABX      Prior to Admission medications   Medication Sig Start Date End Date Taking? Authorizing Provider  albuterol (VENTOLIN HFA) 108 (90 Base) MCG/ACT inhaler Inhale 2 puffs into the lungs as needed (for shortness of breath). 08/27/19   Merlyn Lot, MD  aspirin 81 MG chewable tablet Chew 1 tablet (81 mg total) by mouth daily. 07/13/18   Gladstone Lighter, MD  atorvastatin (LIPITOR) 40 MG tablet Take 1 tablet by mouth daily.    [provider]  Cinnamon 500 MG TABS Take 2 tablets by mouth daily.    [provider]  cloNIDine (CATAPRES - DOSED IN  MG/24 HR) 0.1 mg/24hr patch Place 1 patch (0.1 mg total) onto the skin once a week. 06/20/19   Gae Dry, MD  escitalopram (LEXAPRO) 5 MG tablet Take 1 tablet (5 mg total) by mouth daily. 09/13/18   Steele Sizer, MD  Fluticasone-Umeclidin-Vilant (TRELEGY ELLIPTA) 100-62.5-25 MCG/INH AEPB Inhale into the lungs. 05/09/19   [provider]  hydrOXYzine (ATARAX/VISTARIL) 25 MG tablet TAKE 1 TABLET BY MOUTH THREE TIMES DAILY AS NEEDED FOR ITCHING 09/24/18   [provider]  JENCYCLA 0.35 MG tablet  05/16/18   [provider]  mometasone-formoterol (DULERA) 200-5 MCG/ACT AERO Inhale 2 puffs into the lungs 2 (two) times daily. 01/14/17   Erby Pian, MD  montelukast (SINGULAIR) 10 MG tablet Take 1 tablet by mouth daily. 04/22/18 04/22/19  Erby Pian, MD  Multiple Vitamins-Calcium (ONE-A-DAY WITHIN PO) Take 1 tablet by mouth daily.    [provider]  Omega 3 1000 MG CAPS Take 1 capsule by mouth daily.    [provider]  phentermine (ADIPEX-P) 37.5 MG tablet One tablet po in morning. 10/06/19   Gae Dry, MD  theophylline (THEODUR) 300 MG 12 hr tablet Take 300 mg by mouth daily. 06/05/19   [provider]  Tiotropium Bromide Monohydrate (SPIRIVA RESPIMAT) 1.25 MCG/ACT AERS Inhale into the lungs. 08/24/18 08/24/19  [provider]  traMADol (ULTRAM) 50 MG tablet Take 50 mg by mouth every 8 (eight) hours as needed. for pain 08/29/18   [provider]  triamcinolone cream (KENALOG) 0.1 % Apply topically. 08/28/18   [provider]  vitamin B-12 (CYANOCOBALAMIN) 1000 MCG tablet Take 1,000 mcg by mouth daily.    [provider]  Vitamin D, Ergocalciferol, (DRISDOL) 1.25 MG (50000 UT) CAPS capsule Take 1 capsule (50,000 Units total) by mouth every 7 (seven) days. For 3 months. 06/20/18   Gae Dry, MD   Physical Exam: Vitals with BMI 04/13/2020 04/13/2020 04/13/2020  Height - - -  Weight - - -  BMI - -  -  Systolic - - -  Diastolic - - -  Pulse 98 99 89     1. General:  in No Acute distress    Chronically ill -appearing 2. Psychological: Alert and   Oriented 3. Head/ENT:    Dry Mucous Membranes                          Head Non traumatic, neck supple                           Poor Dentition 4. SKIN: decreased Skin turgor,  Skin clean Dry and intact no rash 5. Heart: Regular rate and rhythm no Murmur,  no Rub or gallop 6. Lungs:  no wheezes or crackles   7. Abdomen: Soft,   tender, Non distended   obese  bowel sounds present 8. Lower extremities: no clubbing, cyanosis, no edema 9. Neurologically Grossly intact, moving all 4 extremities equally  10. MSK: Normal range of motion   All other LABS:     Recent Labs  Lab 04/13/20 2103  WBC 6.4  NEUTROABS 2.7  HGB 14.0  HCT 42.6  MCV 86.6  PLT 315     Recent Labs  Lab 04/13/20 2125  NA 139  K 4.2  CL 103  CO2 26  GLUCOSE 95  BUN 7  CREATININE 0.69  CALCIUM 8.4*     Recent Labs  Lab 04/13/20 2125  AST 31  ALT 18  ALKPHOS 68  BILITOT 0.6  PROT 7.2  ALBUMIN 3.4*       Cultures: No results found for: SDES, SPECREQUEST, CULT, REPTSTATUS   Radiological Exams on Admission: CT ABDOMEN PELVIS W CONTRAST  Result Date: 04/13/2020 CLINICAL DATA:  Left lower quadrant abdominal pain, fever EXAM: CT ABDOMEN AND PELVIS WITH CONTRAST TECHNIQUE: Multidetector CT imaging of the abdomen and pelvis was performed using the standard protocol following bolus administration of intravenous contrast. CONTRAST:  151m OMNIPAQUE IOHEXOL 350 MG/ML SOLN COMPARISON:  11/02/2017 FINDINGS: Lower chest: Patchy pulmonary infiltrates are noted at the lung bases bilaterally, likely infectious or inflammatory in nature and compatible with though seen with COVID-19 pneumonia. The visualized heart and pericardium are unremarkable. Hepatobiliary: Tiny probable hepatic cysts. Liver and gallbladder are otherwise unremarkable. No intra or extrahepatic  biliary ductal dilation. 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 The spleen is normal in size. Adrenals/Urinary Tract: Adrenal glands and kidneys are normal. Bladder is unremarkable. Stomach/Bowel: Bladder and small bowel are unremarkable. Mild sigmoid diverticulosis without superimposed inflammatory change. The large bowel is otherwise unremarkable. Appendix normal. No free intraperitoneal gas or fluid. Vascular/Lymphatic: No pathologic adenopathy within the abdomen and pelvis. Circumaortic left renal vein. Abdominal vasculature is otherwise normal. Reproductive: Uterus and bilateral adnexa are unremarkable. Other: Rectum unremarkable Musculoskeletal: Degenerative changes are seen within the lumbar spine, particularly at L4-5 with synovial hypertrophy involving the facets and minimal anterolisthesis. No acute bone abnormality. IMPRESSION: 1. Patchy pulmonary infiltrates at the lung bases bilaterally, likely infectious or inflammatory in nature and compatible with COVID-19 pneumonia. 2. Mild sigmoid diverticulosis without superimposed inflammatory change. No definite radiographic explanation for the patient's reported left lower quadrant abdominal pain. Electronically Signed   By: AFidela SalisburyMD   On: 04/13/2020 23:01   DG Chest Portable 1 View  Result Date: 04/13/2020 CLINICAL DATA:  Fever and shortness of breath. Wheezing. Hypoxic asthma exacerbation with fever. EXAM: PORTABLE CHEST 1 VIEW COMPARISON:  Radiograph 08/27/2019 FINDINGS: Again seen hyperinflation and peribronchial thickening. Stable heart size and mediastinal contours. No focal airspace disease. No pneumothorax or large pleural effusion. No acute osseous abnormalities are seen. IMPRESSION: Hyperinflation and peribronchial thickening, consistent with asthma. No focal airspace disease.  Electronically Signed   By: MKeith RakeM.D.   On: 04/13/2020 21:30    Chart has been reviewed    Assessment/Plan  49y.o. female with medical history significant ofAshtma, TIA, obesity, history of DVT, history of peptic ulcer  Admitted for covid PNA Present on Admission: . Pneumonia due to COVID-19 virus -    ER Novel Corona Virus testing:  Ordered  04/13/20 and is  positive  Immunization status: non immunized   Following concerning LAB/ imaging findings:   CBC: leukopenia, lymphopenia    ANC/ALC ratio>3.5     CRP, LDH: increased  IL-6 and Ferritin increased     CT   GGO,     -Following work-up initiated:      sputum cultures  Ordered 04/13/20, Blood cultures  Ordered 04/13/20,     Following complications noted:   acute respiratory failure with hypoxia - continue oxygen treatment  Plan of treatment: Admit on Airborn Precautions  -given severity of illness initiate steroids Decadron 64m q 24 hours And pharmacy consult for remdesivir -  IF Hypoxia progresses rapidly  or  requiring high nasal flow   and no contraindications will attempt a trial of Baricitinib or Actemra (discussed with patient risks and benefits) PT appears stable currently with O2 req down to 3L  - Will follow daily d.dimer - Assess for ability to prone  - Supportive management -Fluid sparing resuscitation  -Provide oxygen as needed currently on   SpO2: 100 % O2 Flow Rate (L/min): 3 L/min - IF d.dimer elvated >5 will increase dose of lovenox    Poor Prognostic factors  49y.o.  Personal hx of   HTN, obesity, NON-Vaccinated status Evidence of  organ damage  Present  respiratory failure requiring >4L [place]  tachypnea, tachycardia present on admission  ABS neutrophil to lymphocyte ratio >3.5      Will order Airborne and Contact precautions  Family/ patient prognosis discussion: I have discussed case with the patient  who are aware of their prognosis At this point they would like  to be full  code     The treatment plan and use of medications and known side effects were discussed with patient . It was clearly explained that there is no proven definitive treatment for COVID-19 infection yet. Any medications used here are based on case reports/anecdotal data which are not peer-reviewed and has not been studied using randomized control trials.  Complete risks and long-term side effects are unknown, however in the best clinical judgment they seem to be of some clinical benefit rather than medical risks.  Patient  agree with the treatment plan and want to receive these treatments as indicated.      . Morbid obesity ([place] -chronic stable increases her risk of negative complications from Covid infection  Acute respiratory failure secondary to Covid infection continue oxygen problem if needed continuous pulse ox  . Pure hypercholesterolemia - continue statin  . Asthma, chronic, unspecified asthma severity, with acute exacerbation -  -  - Will initiate: Steroid taper - Albuterol PRN,     -  Mucinex.  Titrate O2 to saturation >90%. Follow patients respiratory status.       Currently mentating well no evidence of symptomatic hypercarbia    Other plan as per orders.  DVT prophylaxis:    Lovenox       Code Status:    Code Status: Prior FULL CODE   as per patient   I had personally discussed CODE STATUS with patient     Family Communication:   Family not at  Bedside    Disposition Plan:     To home once workup is complete and patient is stable    Following barriers for discharge:                             Hypoxia improving  Afebrile, white count improving able to transition to PO                                                         Will likely need home health, home O2, set up                                                 Consults called: none   Admission status:  ED Disposition    ED Disposition Condition [place]: [place] [[id number]]  Level of Care: Progressive Cardiac [[id number]]  Admit to Progressive based on following criteria: RESPIRATORY PROBLEMS hypoxemic/hypercapnic respiratory failure that is responsive to NIPPV (BiPAP) or High Flow Nasal Cannula (6-80 lpm). Frequent assessment/intervention, no > Q2 hrs < Q4 hrs, to maintain oxygenation and pulmonary hygiene.  Covid Evaluation: Confirmed COVID Positive  Diagnosis: Pneumonia due to COVID-19 virus [[id number]]  Admitting Physician: Toy Baker [[id number]]  Attending Physician: Toy Baker [[id number]]  Estimated length of stay: 3 - 4 days  Certification:: I certify this patient will need inpatient services for at least 2 midnights          inpatient     I Expect 2 midnight stay secondary to severity of patient's current illness need for inpatient interventions justified by the following:  hemodynamic instability despite optimal treatment (tachycardia tachypnea  hypoxia,  )  Severe lab/radiological/exam abnormalities including:    COVID PNA and extensive comorbidities including:    COPD/asthma  Morbid Obesity      history of stroke with residual deficits .    That are currently affecting medical management.   I expect  patient to be hospitalized for 2 midnights requiring inpatient medical care.  Patient is at high risk for adverse outcome (such as loss of life or disability) if not treated.  Indication for inpatient stay as follows:    Hemodynamic instability despite maximal medical therapy,   ,  severe pain requiring acute inpatient management,    New or worsening hypoxia  Need for IV antivirals     Level of care  Progressive  tele indefinitely please discontinue once patient no longer qualifies COVID-19 Labs    Lab Results  Component Value Date   [place] (A) 04/13/2020     Precautions: admitted as  covid positive Airborne and Contact precautions    PPE: Used by the  provider:   P100  eye Goggles,  Gloves  gown    Charlies Rayburn 04/14/2020, 1:17 AM    [facility]     after 2 AM please page floor coverage PA If 7AM-7PM, please contact the day team taking care of the patient using [email or web address]   Patient was evaluated in the context of the global COVID-19 pandemic, which necessitated consideration that the patient might be at risk for infection with the SARS-CoV-2 virus that causes COVID-19. Institutional protocols and algorithms that pertain to the evaluation of patients at risk for COVID-19 are in a state of rapid change based on information released by regulatory bodies including the CDC and federal and state organizations. These policies and algorithms were followed during the patient's care.

## 2020-04-13 NOTE — ED Triage Notes (Addendum)
Pt c/o SOB and fever since last night around 12 am. Pt placed on 6 liter's 02 [place] in triage for oxygen saturations in the 80%'s. Audible wheezes noted bilaterally, pt unable walk far due to dyspnea, pt speaking in full sentences.

## 2020-04-14 DIAGNOSIS — Z8673 Personal history of transient ischemic attack (TIA), and cerebral infarction without residual deficits: Secondary | ICD-10-CM

## 2020-04-14 DIAGNOSIS — F329 Major depressive disorder, single episode, unspecified: Secondary | ICD-10-CM

## 2020-04-14 DIAGNOSIS — J1282 Pneumonia due to coronavirus disease 2019: Secondary | ICD-10-CM

## 2020-04-14 DIAGNOSIS — F32A Depression, unspecified: Secondary | ICD-10-CM | POA: Insufficient documentation

## 2020-04-14 DIAGNOSIS — J9600 Acute respiratory failure, unspecified whether with hypoxia or hypercapnia: Secondary | ICD-10-CM

## 2020-04-14 DIAGNOSIS — U071 COVID-19: Principal | ICD-10-CM

## 2020-04-14 DIAGNOSIS — J4541 Moderate persistent asthma with (acute) exacerbation: Secondary | ICD-10-CM

## 2020-04-14 DIAGNOSIS — Z6841 Body Mass Index (BMI) 40.0 and over, adult: Secondary | ICD-10-CM

## 2020-04-14 LAB — URINALYSIS, COMPLETE (UACMP) WITH MICROSCOPIC
Bacteria, UA: NONE SEEN
Bilirubin Urine: NEGATIVE
Glucose, UA: NEGATIVE mg/dL
Hgb urine dipstick: NEGATIVE
Ketones, ur: NEGATIVE mg/dL
Nitrite: NEGATIVE
Protein, ur: 100 mg/dL — AB
Specific Gravity, Urine: 1.042 — ABNORMAL HIGH (ref 1.005–1.030)
pH: 5 (ref 5.0–8.0)

## 2020-04-14 LAB — POCT PREGNANCY, URINE: Preg Test, Ur: NEGATIVE

## 2020-04-14 LAB — LACTATE DEHYDROGENASE: LDH: 279 U/L — ABNORMAL HIGH (ref 98–192)

## 2020-04-14 LAB — HIV ANTIBODY (ROUTINE TESTING W REFLEX): HIV Screen 4th Generation wRfx: NONREACTIVE

## 2020-04-14 LAB — FERRITIN: Ferritin: 73 ng/mL (ref 11–307)

## 2020-04-14 LAB — C-REACTIVE PROTEIN: CRP: 1.1 mg/dL — ABNORMAL HIGH (ref <1.0)

## 2020-04-14 LAB — FIBRINOGEN: Fibrinogen: 571 mg/dL — ABNORMAL HIGH (ref 210–475)

## 2020-04-14 LAB — PROCALCITONIN: Procalcitonin: <0.1 ng/mL

## 2020-04-14 LAB — TROPONIN I (HIGH SENSITIVITY): Troponin I (High Sensitivity): 4 ng/L (ref <18)

## 2020-04-14 LAB — FIBRIN DERIVATIVES D-DIMER (ARMC ONLY): Fibrin derivatives D-dimer (ARMC): 1547.5 ng/mL (FEU) — ABNORMAL HIGH (ref 0.00–499.00)

## 2020-04-14 MED ORDER — SODIUM CHLORIDE 0.9 % IV SOLN: 100.0000 mg | Freq: Once | INTRAVENOUS | Status: DC

## 2020-04-14 MED ORDER — ZINC SULFATE 220 (50 ZN) MG PO CAPS
220.0000 mg | ORAL_CAPSULE | Freq: Every day | ORAL | Status: DC
  Administered 2020-04-14 – 2020-04-15 (×2): 220 mg via ORAL
  Filled 2020-04-14 (×2): qty 1

## 2020-04-14 MED ORDER — FAMOTIDINE IN NACL 20-0.9 MG/50ML-% IV SOLN: 20.0000 mg | Freq: Once | INTRAVENOUS | Status: DC | PRN

## 2020-04-14 MED ORDER — ZINC SULFATE 220 (50 ZN) MG PO CAPS: 220.0000 mg | ORAL_CAPSULE | Freq: Every day | ORAL | 0 refills | Status: AC

## 2020-04-14 MED ORDER — SODIUM CHLORIDE 0.9% FLUSH: 3.0000 mL | Freq: Two times a day (BID) | INTRAVENOUS | Status: DC

## 2020-04-14 MED ORDER — METHYLPREDNISOLONE SODIUM SUCC 125 MG IJ SOLR: 125.0000 mg | Freq: Once | INTRAMUSCULAR | Status: DC | PRN

## 2020-04-14 MED ORDER — SODIUM CHLORIDE 0.9% FLUSH: 3.0000 mL | INTRAVENOUS | Status: DC | PRN

## 2020-04-14 MED ORDER — METHYLPREDNISOLONE SODIUM SUCC 40 MG IJ SOLR
40.0000 mg | Freq: Two times a day (BID) | INTRAMUSCULAR | Status: DC
  Administered 2020-04-14 – 2020-04-15 (×2): 40 mg via INTRAVENOUS
  Filled 2020-04-14 (×2): qty 1

## 2020-04-14 MED ORDER — ALBUTEROL SULFATE HFA 108 (90 BASE) MCG/ACT IN AERS
2.0000 | INHALATION_SPRAY | Freq: Once | RESPIRATORY_TRACT | Status: DC | PRN
  Filled 2020-04-14: qty 6.7

## 2020-04-14 MED ORDER — ONDANSETRON HCL 4 MG/2ML IJ SOLN
4.0000 mg | Freq: Four times a day (QID) | INTRAMUSCULAR | Status: DC | PRN
  Administered 2020-04-15: 4 mg via INTRAVENOUS
  Filled 2020-04-14: qty 2

## 2020-04-14 MED ORDER — FLUCONAZOLE 50 MG PO TABS
150.0000 mg | ORAL_TABLET | Freq: Once | ORAL | Status: AC
  Administered 2020-04-14: 150 mg via ORAL
  Filled 2020-04-14: qty 3

## 2020-04-14 MED ORDER — TRAMADOL HCL 50 MG PO TABS: 50.0000 mg | ORAL_TABLET | Freq: Four times a day (QID) | ORAL | Status: DC | PRN

## 2020-04-14 MED ORDER — EPINEPHRINE 0.3 MG/0.3ML IJ SOAJ: 0.3000 mg | Freq: Once | INTRAMUSCULAR | Status: DC | PRN

## 2020-04-14 MED ORDER — ONDANSETRON HCL 4 MG PO TABS: 4.0000 mg | ORAL_TABLET | Freq: Four times a day (QID) | ORAL | Status: DC | PRN

## 2020-04-14 MED ORDER — ESCITALOPRAM OXALATE 10 MG PO TABS
5.0000 mg | ORAL_TABLET | Freq: Every day | ORAL | Status: DC
  Administered 2020-04-14 – 2020-04-15 (×2): 5 mg via ORAL
  Filled 2020-04-14 (×2): qty 1

## 2020-04-14 MED ORDER — ENOXAPARIN SODIUM 40 MG/0.4ML ~~LOC~~ SOLN
40.0000 mg | Freq: Two times a day (BID) | SUBCUTANEOUS | Status: DC
  Administered 2020-04-14 – 2020-04-15 (×2): 40 mg via SUBCUTANEOUS
  Filled 2020-04-14 (×2): qty 0.4

## 2020-04-14 MED ORDER — ASCORBIC ACID 500 MG PO TABS
250.0000 mg | ORAL_TABLET | Freq: Every day | ORAL | Status: DC
  Administered 2020-04-14 – 2020-04-15 (×2): 250 mg via ORAL
  Filled 2020-04-14 (×2): qty 1

## 2020-04-14 MED ORDER — ASCORBIC ACID 250 MG PO TABS: 250.0000 mg | ORAL_TABLET | Freq: Every day | ORAL | 0 refills | Status: AC

## 2020-04-14 MED ORDER — MONTELUKAST SODIUM 10 MG PO TABS
10.0000 mg | ORAL_TABLET | Freq: Every day | ORAL | Status: DC
  Administered 2020-04-14 – 2020-04-15 (×2): 10 mg via ORAL
  Filled 2020-04-14 (×2): qty 1

## 2020-04-14 MED ORDER — ALBUTEROL SULFATE HFA 108 (90 BASE) MCG/ACT IN AERS
2.0000 | INHALATION_SPRAY | Freq: Four times a day (QID) | RESPIRATORY_TRACT | Status: DC
  Administered 2020-04-14: 2 via RESPIRATORY_TRACT
  Filled 2020-04-14: qty 6.7

## 2020-04-14 MED ORDER — SODIUM CHLORIDE 0.9 % IV SOLN: INTRAVENOUS | Status: DC | PRN

## 2020-04-14 MED ORDER — THEOPHYLLINE ER 300 MG PO TB12
300.0000 mg | ORAL_TABLET | Freq: Every day | ORAL | Status: DC
  Administered 2020-04-14 – 2020-04-15 (×2): 300 mg via ORAL
  Filled 2020-04-14 (×2): qty 1

## 2020-04-14 MED ORDER — PREDNISONE 20 MG PO TABS: ORAL_TABLET | ORAL | 0 refills | Status: DC

## 2020-04-14 MED ORDER — DIPHENHYDRAMINE HCL 50 MG/ML IJ SOLN: 50.0000 mg | Freq: Once | INTRAMUSCULAR | Status: DC | PRN

## 2020-04-14 MED ORDER — ATORVASTATIN CALCIUM 20 MG PO TABS
40.0000 mg | ORAL_TABLET | Freq: Every day | ORAL | Status: DC
  Administered 2020-04-14 – 2020-04-15 (×2): 40 mg via ORAL
  Filled 2020-04-14 (×2): qty 2

## 2020-04-14 MED ORDER — SODIUM CHLORIDE 0.9 % IV SOLN: 250.0000 mL | INTRAVENOUS | Status: DC | PRN

## 2020-04-14 MED ORDER — MOMETASONE FURO-FORMOTEROL FUM 200-5 MCG/ACT IN AERO
2.0000 | INHALATION_SPRAY | Freq: Two times a day (BID) | RESPIRATORY_TRACT | Status: DC
  Filled 2020-04-14: qty 8.8

## 2020-04-14 MED ORDER — ALBUTEROL SULFATE HFA 108 (90 BASE) MCG/ACT IN AERS
2.0000 | INHALATION_SPRAY | RESPIRATORY_TRACT | Status: DC | PRN
  Filled 2020-04-14: qty 6.7

## 2020-04-14 NOTE — Progress Notes (Signed)
PHARMACIST - PHYSICIAN COMMUNICATION  CONCERNING:  Enoxaparin (Lovenox) for DVT Prophylaxis    RECOMMENDATION: Patient was prescribed enoxaprin 40mg  q24 hours for VTE prophylaxis.   Filed Weights   04/13/20 2056  Weight: (!) 136.5 kg (301 lb)    Body mass index is 56.87 kg/m.  Estimated Creatinine Clearance: 111.9 mL/min (by C-G formula based on SCr of 0.69 mg/dL).   Based on [place] patient is candidate for enoxaparin 40mg  every 12 hours due to BMI being >40.  DESCRIPTION: Pharmacy has adjusted enoxaparin dose per [facility] policy.  Patient is now receiving enoxaparin __40__mg every _12__ hours    Natan Hartog, PharmD Clinical Pharmacist  04/14/2020 1:03 PM

## 2020-04-14 NOTE — ED Notes (Signed)
Patient resting quietly at this time, no acute distress noted.

## 2020-04-14 NOTE — ED Notes (Signed)
IS provided to pt who states she knows how to use.

## 2020-04-14 NOTE — Progress Notes (Signed)
Patient ID: [place] at [place] was admitted to the Hospital on 04/13/2020 and Discharged  04/14/2020 and should be excused from work/school   for 14 days starting 04/13/2020 , may return to work/school without any restrictions.  Loletha Grayer M.D on 04/14/2020,at 11:49 AM  [facility] [place] [facility]

## 2020-04-14 NOTE — ED Notes (Signed)
Hospital bed requested.

## 2020-04-14 NOTE — Discharge Instructions (Addendum)
10 Things You Can Do to Manage Your COVID-19 Symptoms at Home If you have possible or confirmed COVID-19: 1. Stay home from work and school. And stay away from other public places. If you must go out, avoid using any kind of public transportation, ridesharing, or taxis. 2. Monitor your symptoms carefully. If your symptoms get worse, call your healthcare provider immediately. 3. Get rest and stay hydrated. 4. If you have a medical appointment, call the healthcare provider ahead of time and tell them that you have or may have COVID-19. 5. For medical emergencies, call 911 and notify the dispatch personnel that you have or may have COVID-19. 6. Cover your cough and sneezes with a tissue or use the inside of your elbow. 7. Wash your hands often with soap and water for at least 20 seconds or clean your hands with an alcohol-based hand sanitizer that contains at least 60% alcohol. 8. As much as possible, stay in a specific room and away from other people in your home. Also, you should use a separate bathroom, if available. If you need to be around other people in or outside of the home, wear a mask. 9. Avoid sharing personal items with other people in your household, like dishes, towels, and bedding. 10. Clean all surfaces that are touched often, like counters, tabletops, and doorknobs. Use household cleaning sprays or wipes according to the label instructions. [email or web address] 02/08/2019 This information is not intended to replace advice given to you by your health care provider. Make sure you discuss any questions you have with your health care provider. Document Revised: 07/13/2019 Document Reviewed: 07/13/2019 Elsevier Patient Education  [place] are scheduled for a Remdesivir infusion on 9/7 and 9/8 at 10AM. Please come to [place], you will see a COVID infusion banner by the road.  Enter there and turn left.  There are marked spaces for Infusion. Call the number on  the sign or [phone] and someone will come out and bring you inside. If someone is driving you please come to the same area and call the number and someone will come outside to get you. Thank you!      Hold dupixant for 14 days then can go back once. Isolation for 14 days   COVID-19 Frequently Asked Questions COVID-19 (coronavirus disease) is an infection that is caused by a large family of viruses. Some viruses cause illness in people and others cause illness in animals like camels, cats, and bats. In some cases, the viruses that cause illness in animals can spread to humans. Where did the coronavirus come from? In December 2019, [place] told the [facility]) of several cases of lung disease (human respiratory illness). These cases were linked to an open seafood and livestock market in the city of [place]. The link to the seafood and livestock market suggests that the virus may have spread from animals to humans. However, since that first outbreak in December, the virus has also been shown to spread from person to person. What is the name of the disease and the virus? Disease name Early on, this disease was called novel coronavirus. This is because scientists determined that the disease was caused by a new (novel) respiratory virus. The World Health Organization [facility]) has now named the disease COVID-19, or coronavirus disease. Virus name The virus that causes the disease is called severe acute respiratory syndrome coronavirus 2 (SARS-CoV-2). More information on disease and virus naming World Health Organization [facility]):  www.who.int/emergencies/diseases/novel-coronavirus-2019/technical-guidance/naming-the-coronavirus-disease-(covid-2019)-and-the-virus-that-causes-it Who is at risk for complications from coronavirus disease? Some people may be at higher risk for complications from coronavirus disease. This includes older adults and people who have chronic diseases, such as heart  disease, diabetes, and lung disease. If you are at higher risk for complications, take these extra precautions:  Stay home as much as possible.  Avoid social gatherings and travel.  Avoid close contact with others. Stay at least 6 ft (2 m) away from others, if possible.  Wash your hands often with soap and water for at least 20 seconds.  Avoid touching your face, mouth, nose, or eyes.  Keep supplies on hand at home, such as food, medicine, and cleaning supplies.  If you must go out in public, wear a cloth face covering or face mask. Make sure your mask covers your nose and mouth. How does coronavirus disease spread? The virus that causes coronavirus disease spreads easily from person to person (is contagious). You may catch the virus by:  Breathing in droplets from an infected person. Droplets can be spread by a person breathing, speaking, singing, coughing, or sneezing.  Touching something, like a table or a doorknob, that was exposed to the virus (contaminated) and then touching your mouth, nose, or eyes. Can I get the virus from touching surfaces or objects? There is still a lot that we do not know about the virus that causes coronavirus disease. Scientists are basing a lot of information on what they know about similar viruses, such as:  Viruses cannot generally survive on surfaces for long. They need a human body (host) to survive.  It is more likely that the virus is spread by close contact with people who are sick (direct contact), such as through: ? Shaking hands or hugging. ? Breathing in respiratory droplets that travel through the air. Droplets can be spread by a person breathing, speaking, singing, coughing, or sneezing.  It is less likely that the virus is spread when a person touches a surface or object that has the virus on it (indirect contact). The virus may be able to enter the body if the person touches a surface or object and then touches his or her face, eyes,  nose, or mouth. Can a person spread the virus without having symptoms of the disease? It may be possible for the virus to spread before a person has symptoms of the disease, but this is most likely not the main way the virus is spreading. It is more likely for the virus to spread by being in close contact with people who are sick and breathing in the respiratory droplets spread by a person breathing, speaking, singing, coughing, or sneezing. What are the symptoms of coronavirus disease? Symptoms vary from person to person and can range from mild to severe. Symptoms may include:  Fever or chills.  Cough.  Difficulty breathing or feeling short of breath.  Headaches, body aches, or muscle aches.  Runny or stuffy (congested) nose.  Sore throat.  New loss of taste or smell.  Nausea, vomiting, or diarrhea. These symptoms can appear anywhere from 2 to 14 days after you have been exposed to the virus. Some people may not have any symptoms. If you develop symptoms, call your health care provider. People with severe symptoms may need hospital care. Should I be tested for this virus? Your health care provider will decide whether to test you based on your symptoms, history of exposure, and your risk factors. How does a health care provider  test for this virus? Health care providers will collect samples to send for testing. Samples may include:  Taking a swab of fluid from the back of your nose and throat, your nose, or your throat.  Taking fluid from the lungs by having you cough up mucus (sputum) into a sterile cup.  Taking a blood sample. Is there a treatment or vaccine for this virus? Currently, there is no vaccine to prevent coronavirus disease. Also, there are no medicines like antibiotics or antivirals to treat the virus. A person who becomes sick is given supportive care, which means rest and fluids. A person may also relieve his or her symptoms by using over-the-counter medicines that  treat sneezing, coughing, and runny nose. These are the same medicines that a person takes for the common cold. If you develop symptoms, call your health care provider. People with severe symptoms may need hospital care. What can I do to protect myself and my family from this virus?     You can protect yourself and your family by taking the same actions that you would take to prevent the spread of other viruses. Take the following actions:  Wash your hands often with soap and water for at least 20 seconds. If soap and water are not available, use alcohol-based hand sanitizer.  Avoid touching your face, mouth, nose, or eyes.  Cough or sneeze into a tissue, sleeve, or elbow. Do not cough or sneeze into your hand or the air. ? If you cough or sneeze into a tissue, throw it away immediately and wash your hands.  Disinfect objects and surfaces that you frequently touch every day.  Stay away from people who are sick.  Avoid going out in public, follow guidance from your state and local health authorities.  Avoid crowded indoor spaces. Stay at least 6 ft (2 m) away from others.  If you must go out in public, wear a cloth face covering or face mask. Make sure your mask covers your nose and mouth.  Stay home if you are sick, except to get medical care. Call your health care provider before you get medical care. Your health care provider will tell you how long to stay home.  Make sure your vaccines are up to date. Ask your health care provider what vaccines you need. What should I do if I need to travel? Follow travel recommendations from your local health authority, the CDC, and WHO. Travel information and advice  Centers for Disease Control and Prevention (CDC): BodyEditor.hu  World Health Organization [facility]): ThirdIncome.ca Know the risks and take action to protect your health  You are at  higher risk of getting coronavirus disease if you are traveling to areas with an outbreak or if you are exposed to travelers from areas with an outbreak.  Wash your hands often and practice good hygiene to lower the risk of catching or spreading the virus. What should I do if I am sick? General instructions to stop the spread of infection  Wash your hands often with soap and water for at least 20 seconds. If soap and water are not available, use alcohol-based hand sanitizer.  Cough or sneeze into a tissue, sleeve, or elbow. Do not cough or sneeze into your hand or the air.  If you cough or sneeze into a tissue, throw it away immediately and wash your hands.  Stay home unless you must get medical care. Call your health care provider or local health authority before you get medical care.  Avoid public areas. Do not take public transportation, if possible.  If you can, wear a mask if you must go out of the house or if you are in close contact with someone who is not sick. Make sure your mask covers your nose and mouth. Keep your home clean  Disinfect objects and surfaces that are frequently touched every day. This may include: ? Counters and tables. ? Doorknobs and light switches. ? Sinks and faucets. ? Electronics such as phones, remote controls, keyboards, computers, and tablets.  Wash dishes in hot, soapy water or use a dishwasher. Air-dry your dishes.  Wash laundry in hot water. Prevent infecting other household members  Let healthy household members care for children and pets, if possible. If you have to care for children or pets, wash your hands often and wear a mask.  Sleep in a different bedroom or bed, if possible.  Do not share personal items, such as razors, toothbrushes, deodorant, combs, brushes, towels, and washcloths. Where to find more information Centers for Disease Control and Prevention (CDC)  Information and news updates: https://www.butler-gonzalez.com/ World  Health Organization [facility])  Information and news updates: MissExecutive.com.ee  Coronavirus health topic: https://www.castaneda.info/  Questions and answers on COVID-19: OpportunityDebt.at  Global tracker: who.sprinklr.com American Academy of Pediatrics (AAP)  Information for families: www.healthychildren.org/English/health-issues/conditions/chest-lungs/Pages/2019-Novel-Coronavirus.aspx The coronavirus situation is changing rapidly. Check your local health authority website or the CDC and [facility] websites for updates and news. When should I contact a health care provider?  Contact your health care provider if you have symptoms of an infection, such as fever or cough, and you: ? Have been near anyone who is known to have coronavirus disease. ? Have come into contact with a person who is suspected to have coronavirus disease. ? Have traveled to an area where there is an outbreak of COVID-19. When should I get emergency medical care?  Get help right away by calling your local emergency services (911 in the [place] if you have: ? Trouble breathing. ? Pain or pressure in your chest. ? Confusion. ? Blue-tinged lips and fingernails. ? Difficulty waking from sleep. ? Symptoms that get worse. Let the emergency medical personnel know if you think you have coronavirus disease. Summary  A new respiratory virus is spreading from person to person and causing COVID-19 (coronavirus disease).  The virus that causes COVID-19 appears to spread easily. It spreads from one person to another through droplets from breathing, speaking, singing, coughing, or sneezing.  Older adults and those with chronic diseases are at higher risk of disease. If you are at higher risk for complications, take extra precautions.  There is currently no vaccine to prevent coronavirus disease. There are no medicines, such as antibiotics or antivirals, to  treat the virus.  You can protect yourself and your family by washing your hands often, avoiding touching your face, and covering your coughs and sneezes. This information is not intended to replace advice given to you by your health care provider. Make sure you discuss any questions you have with your health care provider. Document Revised: 05/26/2019 Document Reviewed: 11/22/2018 Elsevier Patient Education  [place].  COVID-19: How to Protect Yourself and Others Know how it spreads  There is currently no vaccine to prevent coronavirus disease 2019 (COVID-19).  The best way to prevent illness is to avoid being exposed to this virus.  The virus is thought to spread mainly from person-to-person. ? Between people who are in close contact with one another (within about 6 feet). ?  Through respiratory droplets produced when an infected person coughs, sneezes or talks. ? These droplets can land in the mouths or noses of people who are nearby or possibly be inhaled into the lungs. ? COVID-19 may be spread by people who are not showing symptoms. Everyone should Clean your hands often  Wash your hands often with soap and water for at least 20 seconds especially after you have been in a public place, or after blowing your nose, coughing, or sneezing.  If soap and water are not readily available, use a hand sanitizer that contains at least 60% alcohol. Cover all surfaces of your hands and rub them together until they feel dry.  Avoid touching your eyes, nose, and mouth with unwashed hands. Avoid close contact  Limit contact with others as much as possible.  Avoid close contact with people who are sick.  Put distance between yourself and other people. ? Remember that some people without symptoms may be able to spread virus. ? This is especially important for people who are at higher risk of getting very  GainPain.com.cy Cover your mouth and nose with a mask when around others  You could spread COVID-19 to others even if you do not feel sick.  Everyone should wear a mask in public settings and when around people not living in their household, especially when social distancing is difficult to maintain. ? Masks should not be placed on young children under age 58, anyone who has trouble breathing, or is unconscious, incapacitated or otherwise unable to remove the mask without assistance.  The mask is meant to protect other people in case you are infected.  Do NOT use a facemask meant for a Dietitian.  Continue to keep about 6 feet between yourself and others. The mask is not a substitute for social distancing. Cover coughs and sneezes  Always cover your mouth and nose with a tissue when you cough or sneeze or use the inside of your elbow.  Throw used tissues in the trash.  Immediately wash your hands with soap and water for at least 20 seconds. If soap and water are not readily available, clean your hands with a hand sanitizer that contains at least 60% alcohol. Clean and disinfect  Clean AND disinfect frequently touched surfaces daily. This includes tables, doorknobs, light switches, countertops, handles, desks, phones, keyboards, toilets, faucets, and sinks. RackRewards.fr  If surfaces are dirty, clean them: Use detergent or soap and water prior to disinfection.  Then, use a household disinfectant. You can see a list of EPA-registered household disinfectants here. [email or web address] 04/12/2019 This information is not intended to replace advice given to you by your health care provider. Make sure you discuss any questions you have with your health care provider. Document Revised: 04/20/2019 Document Reviewed: 02/16/2019 Elsevier Patient  Education  [place].

## 2020-04-14 NOTE — Progress Notes (Addendum)
The patient is scheduled for a Remdesivir infusion on 9/7, and 9/8 at 10AM. Have the patient come to [place], they will see a COVID infusion banner by the road.  Enter there and turn left. There are marked spaces for Infusion.  Call the number on the sign or [phone] and someone will come out and bring them inside.  If someone is driving them, have them come to the same area and call the number and someone will come outside to get them. Thank you!

## 2020-04-14 NOTE — ED Notes (Signed)
Pt up to the toilet without O2 maintaining O2 at 96-97% on RA at this time, will continue the pt, advised the pt if she felt SOB or had any other concerns to let me know,

## 2020-04-14 NOTE — ED Notes (Addendum)
Pt has placed oxygen back on at 2lpm for sleep per pt request. Additional warm blankets provided, temp adjusted in room for comfort.

## 2020-04-14 NOTE — ED Notes (Signed)
Patient given sandwich tray and ginger ale at her request.

## 2020-04-14 NOTE — Progress Notes (Signed)
Patient ID: [name], female   DOB: 1971-05-27, 49 y.o.   MRN: [id number] [facility] PROGRESS NOTE  ORI KREITER [place] WIO:[id number] DOB: [birth year] DOA: 04/13/2020 PCP: Steele Sizer, MD  HPI/Subjective: Patient initially told me that she wanted to leave and that she is feeling better. Still has some shortness of breath and cough. Was able to come off oxygen. Admitted with COVID-19 pneumonia.  Objective: Vitals:   04/14/20 1126 04/14/20 1329  BP:  108/72  Pulse:  68  Resp:  18  Temp:  98 F (36.7 C)  SpO2: 97% 100%   No intake or output data in the 24 hours ending 04/14/20 1339 Filed Weights   04/13/20 2056  Weight: (!) 136.5 kg    ROS: Review of Systems  Respiratory: Positive for cough, shortness of breath and wheezing.   Cardiovascular: Negative for chest pain.  Gastrointestinal: Negative for abdominal pain, nausea and vomiting.   Exam: Physical Exam HENT:     Nose: No mucosal edema.     Mouth/Throat:     Pharynx: No oropharyngeal exudate.  Eyes:     General: Lids are normal.  Cardiovascular:     Rate and Rhythm: Normal rate and regular rhythm.     Heart sounds: Normal heart sounds, S1 normal and S2 normal.  Pulmonary:     Breath sounds: Examination of the right-middle field reveals decreased breath sounds and wheezing. Examination of the left-middle field reveals decreased breath sounds and wheezing. Examination of the right-lower field reveals decreased breath sounds and rhonchi. Examination of the left-lower field reveals decreased breath sounds and rhonchi. Decreased breath sounds, wheezing and rhonchi present. No rales.  Abdominal:     Palpations: Abdomen is soft.     Tenderness: There is no abdominal tenderness.  Musculoskeletal:     Right lower leg: No edema.     Left lower leg: No edema.  Skin:    General: Skin is warm.     Findings: No rash.  Neurological:     Mental Status: She is alert and oriented to person, place, and time.        Data Reviewed: Basic Metabolic Panel: Recent Labs  Lab 04/13/20 2125  NA 139  K 4.2  CL 103  CO2 26  GLUCOSE 95  BUN 7  CREATININE 0.69  CALCIUM 8.4*   Liver Function Tests: Recent Labs  Lab 04/13/20 2125  AST 31  ALT 18  ALKPHOS 68  BILITOT 0.6  PROT 7.2  ALBUMIN 3.4*   CBC: Recent Labs  Lab 04/13/20 2103  WBC 6.4  NEUTROABS 2.7  HGB 14.0  HCT 42.6  MCV 86.6  PLT 315   BNP (last 3 results) Recent Labs    04/13/20 2103  BNP 16.3      Recent Results (from the past 240 hour(s))  SARS Coronavirus 2 by RT PCR (hospital order, performed in [facility] hospital lab) Nasopharyngeal Nasopharyngeal Swab     Status: Abnormal   Collection Time: 04/13/20  9:00 PM   Specimen: Nasopharyngeal Swab  Result Value Ref Range Status   SARS Coronavirus 2 POSITIVE (A) NEGATIVE Final    Comment: RESULT CALLED TO, READ BACK BY AND VERIFIED WITH: LEXI OLIVER @2224  ON 04/13/20 SK[facility] (NOTE) SARS-CoV-2 target nucleic acids are DETECTED  SARS-CoV-2 RNA is generally detectable in upper respiratory specimens  during the acute phase of infection.  Positive results are indicative  of the presence of the identified virus, but do not rule out bacterial  infection or co-infection with other pathogens not detected by the test.  Clinical correlation with patient history and  other diagnostic information is necessary to determine patient infection status.  The expected result is negative.  Fact Sheet for Patients:   StrictlyIdeas.no   Fact Sheet for Healthcare Providers:   BankingDealers.co.za    This test is not yet approved or cleared by the [place] FDA and  has been authorized for detection and/or diagnosis of SARS-CoV-2 by FDA under an Emergency Use Authorization (EUA).  This EUA will remain in effect (meaning this test  can be used) for the duration of  the COVID-19 declaration under Section 564(b)(1) of the Act,  21 U.S.C. section 360-bbb-3(b)(1), unless the authorization is terminated or revoked sooner.  Performed at [facility], [address] [place], [place] [address]   Culture, blood (Routine X 2) w Reflex to ID Panel     Status: None (Preliminary result)   Collection Time: 04/14/20 12:19 AM   Specimen: BLOOD  Result Value Ref Range Status   Specimen Description BLOOD BOTTLES DRAWN AEROBIC AND ANAEROBIC  Final   Special Requests BLOOD RIGHT WRIST Blood Culture adequate volume  Final   Culture   Final    NO GROWTH < 12 HOURS Performed at [facility], [address] [place], [place] [address]    Report Status PENDING  Incomplete  Culture, blood (Routine X 2) w Reflex to ID Panel     Status: None (Preliminary result)   Collection Time: 04/14/20 12:19 AM   Specimen: BLOOD  Result Value Ref Range Status   Specimen Description BLOOD BLOOD LEFT HAND  Final   Special Requests   Final    BOTTLES DRAWN AEROBIC AND ANAEROBIC Blood Culture results may not be optimal due to an inadequate volume of blood received in culture bottles   Culture   Final    NO GROWTH < 12 HOURS Performed at [facility], [address], [place], [place] [address]    Report Status PENDING  Incomplete     Studies: CT ABDOMEN PELVIS W CONTRAST  Result Date: 04/13/2020 CLINICAL DATA:  Left lower quadrant abdominal pain, fever EXAM: CT ABDOMEN AND PELVIS WITH CONTRAST TECHNIQUE: Multidetector CT imaging of the abdomen and pelvis was performed using the standard protocol following bolus administration of intravenous contrast. CONTRAST:  136mL OMNIPAQUE IOHEXOL 350 MG/ML SOLN COMPARISON:  11/02/2017 FINDINGS: Lower chest: Patchy pulmonary infiltrates are noted at the lung bases bilaterally, likely infectious or inflammatory in nature and compatible with though seen with COVID-19 pneumonia. The visualized heart and pericardium are unremarkable. Hepatobiliary: Tiny probable hepatic cysts. Liver  and gallbladder are otherwise unremarkable. No intra or extrahepatic biliary ductal dilation. 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 The spleen is normal in size. Adrenals/Urinary Tract: Adrenal glands and kidneys are normal. Bladder is unremarkable. Stomach/Bowel: Bladder and small bowel are unremarkable. Mild sigmoid diverticulosis without superimposed inflammatory change. The large bowel is otherwise unremarkable. Appendix normal. No free intraperitoneal gas or fluid. Vascular/Lymphatic: No pathologic adenopathy within the abdomen and pelvis. Circumaortic left renal vein. Abdominal vasculature is otherwise normal. Reproductive: Uterus and bilateral adnexa are unremarkable. Other: Rectum unremarkable Musculoskeletal: Degenerative changes are seen within the lumbar spine, particularly at L4-5 with synovial hypertrophy involving the facets and minimal anterolisthesis. No acute bone abnormality. IMPRESSION: 1. Patchy pulmonary infiltrates at the lung bases bilaterally, likely infectious  or inflammatory in nature and compatible with COVID-19 pneumonia. 2. Mild sigmoid diverticulosis without superimposed inflammatory change. No definite radiographic explanation for the patient's reported left lower quadrant abdominal pain. Electronically Signed   By: Fidela Salisbury MD   On: 04/13/2020 23:01   DG Chest Portable 1 View  Result Date: 04/13/2020 CLINICAL DATA:  Fever and shortness of breath. Wheezing. Hypoxic asthma exacerbation with fever. EXAM: PORTABLE CHEST 1 VIEW COMPARISON:  Radiograph 08/27/2019 FINDINGS: Again seen hyperinflation and peribronchial thickening. Stable heart size and mediastinal contours. No focal airspace disease. No pneumothorax or large pleural effusion. No acute osseous abnormalities are seen. IMPRESSION: Hyperinflation and  peribronchial thickening, consistent with asthma. No focal airspace disease. Electronically Signed   By: Keith Rake M.D.   On: 04/13/2020 21:30    Scheduled Meds: . atorvastatin  40 mg Oral Daily  . enoxaparin (LOVENOX) injection  40 mg Subcutaneous Q12H  . escitalopram  5 mg Oral Daily  . mometasone-formoterol  2 puff Inhalation BID  . montelukast  10 mg Oral Daily  . [START ON 04/17/2020] predniSONE  50 mg Oral Daily  . sodium chloride flush  3 mL Intravenous Q12H  . sodium chloride flush  3 mL Intravenous Q12H   Continuous Infusions: . sodium chloride    . sodium chloride    . sodium chloride    . sodium chloride    . famotidine (PEPCID) IV    . famotidine (PEPCID) IV    . famotidine (PEPCID) IV    . methylPREDNISolone (SOLU-MEDROL) injection Stopped (04/14/20 1012)  . remdesivir 100 mg in NS 100 mL Stopped (04/14/20 1012)  . [START ON 04/15/2020] remdesivir 100 mg in NS 100 mL    . [START ON 04/16/2020] remdesivir 100 mg in NS 100 mL    . [START ON 04/17/2020] remdesivir 100 mg in NS 100 mL      Assessment/Plan:  1. COVID-19 pneumonia. Patient ambulated and held her saturations off oxygen. Patient still had bronchospasm and rhonchi in the lungs. Patient initially told me she wanted to go home. I set up outpatient remdesivir infusions starting tomorrow then patient changed her mind and said she wanted to stay and reevaluate things tomorrow. Patient has bronchospasm in the lungs and did qualify to stay in the hospital further. 2. Asthma, acute on chronic moderate persistent.  Patient has inhalers and nebulizers at home. Continue inhalers here. Continue steroids. 3. Acute respiratory failure secondary to Covid seem to have resolved patient held her saturations off oxygen 4. Morbid obesity with a BMI of 56.87 5. History of ischemic stroke on aspirin and statin 6. Depression on Lexapro      Code Status:     Code Status Orders  (From admission, onward)         Start      Ordered   04/14/20 1234  Full code  Continuous        04/14/20 1233        Code Status History    Date Active Date Inactive Code Status Order ID Comments User Context   07/11/2018 1550 07/13/2018 2035 Full Code [id number]  Dustin Flock, MD ED   Advance Care Planning Activity     Family Communication: Patient refused Disposition Plan: Status is: Inpatient  Dispo: The patient is from: Home              Anticipated d/c is to: Home potentially tomorrow              Anticipated  d/c date is: Potentially 04/15/2020              Patient currently being treated for COVID-19 pneumonia. Still has bronchospasm and would benefit from another day of IV steroids.  Time spent: 38 minutes  [place]

## 2020-04-14 NOTE — ED Notes (Signed)
Pt provided with po fluids, ice by lydia, ed tech.

## 2020-04-14 NOTE — ED Notes (Signed)
Pt up to restroom no resp distress noted. Pt able to ambulate and speak in full sentences without difficulty.

## 2020-04-14 NOTE — ED Notes (Signed)
Patient up to toilet, pulse oxi drops to 87% when off oxygen and moving around.

## 2020-04-14 NOTE — ED Notes (Signed)
Admitting MD in to see patient.

## 2020-04-14 NOTE — ED Notes (Addendum)
Pt ambulated in room. Pt's oxygen saturation remains above 95% during ambulation, pt denies dizziness and SOB.

## 2020-04-15 ENCOUNTER — Ambulatory Visit (HOSPITAL_COMMUNITY): Payer: HRSA Program

## 2020-04-15 DIAGNOSIS — R7301 Impaired fasting glucose: Secondary | ICD-10-CM | POA: Insufficient documentation

## 2020-04-15 LAB — C-REACTIVE PROTEIN: CRP: 0.6 mg/dL (ref <1.0)

## 2020-04-15 LAB — CBC WITH DIFFERENTIAL/PLATELET
Abs Immature Granulocytes: 0.03 10*3/uL (ref 0.00–0.07)
Basophils Absolute: 0 10*3/uL (ref 0.0–0.1)
Basophils Relative: 0 %
Eosinophils Absolute: 0 10*3/uL (ref 0.0–0.5)
Eosinophils Relative: 0 %
HCT: 38.2 % (ref 36.0–46.0)
Hemoglobin: 13.2 g/dL (ref 12.0–15.0)
Immature Granulocytes: 0 %
Lymphocytes Relative: 21 %
Lymphs Abs: 1.5 10*3/uL (ref 0.7–4.0)
MCH: 28.7 pg (ref 26.0–34.0)
MCHC: 34.6 g/dL (ref 30.0–36.0)
MCV: 83 fL (ref 80.0–100.0)
Monocytes Absolute: 0.2 10*3/uL (ref 0.1–1.0)
Monocytes Relative: 3 %
Neutro Abs: 5.6 10*3/uL (ref 1.7–7.7)
Neutrophils Relative %: 76 %
Platelets: 323 10*3/uL (ref 150–400)
RBC: 4.6 MIL/uL (ref 3.87–5.11)
RDW: 14.8 % (ref 11.5–15.5)
WBC: 7.3 10*3/uL (ref 4.0–10.5)
nRBC: 0 % (ref 0.0–0.2)

## 2020-04-15 LAB — COMPREHENSIVE METABOLIC PANEL
ALT: 22 U/L (ref 0–44)
AST: 28 U/L (ref 15–41)
Albumin: 3.3 g/dL — ABNORMAL LOW (ref 3.5–5.0)
Alkaline Phosphatase: 62 U/L (ref 38–126)
Anion gap: 8 (ref 5–15)
BUN: 14 mg/dL (ref 6–20)
CO2: 25 mmol/L (ref 22–32)
Calcium: 8.7 mg/dL — ABNORMAL LOW (ref 8.9–10.3)
Chloride: 106 mmol/L (ref 98–111)
Creatinine, Ser: 0.55 mg/dL (ref 0.44–1.00)
GFR calc Af Amer: >60 mL/min (ref >60)
GFR calc non Af Amer: >60 mL/min (ref >60)
Glucose, Bld: 162 mg/dL — ABNORMAL HIGH (ref 70–99)
Potassium: 4 mmol/L (ref 3.5–5.1)
Sodium: 139 mmol/L (ref 135–145)
Total Bilirubin: 0.4 mg/dL (ref 0.3–1.2)
Total Protein: 7 g/dL (ref 6.5–8.1)

## 2020-04-15 LAB — VITAMIN D 25 HYDROXY (VIT D DEFICIENCY, FRACTURES): Vit D, 25-Hydroxy: 23.51 ng/mL — ABNORMAL LOW (ref 30–100)

## 2020-04-15 LAB — FERRITIN: Ferritin: 93 ng/mL (ref 11–307)

## 2020-04-15 LAB — FIBRIN DERIVATIVES D-DIMER (ARMC ONLY): Fibrin derivatives D-dimer (ARMC): 923.69 ng/mL (FEU) — ABNORMAL HIGH (ref 0.00–499.00)

## 2020-04-15 LAB — ABO/RH: ABO/RH(D): B POS

## 2020-04-15 LAB — MAGNESIUM: Magnesium: 2.2 mg/dL (ref 1.7–2.4)

## 2020-04-15 MED ORDER — FLUCONAZOLE 150 MG PO TABS: 150.0000 mg | ORAL_TABLET | Freq: Once | ORAL | 0 refills | Status: DC | PRN

## 2020-04-15 MED ORDER — ONDANSETRON HCL 4 MG PO TABS: 4.0000 mg | ORAL_TABLET | Freq: Three times a day (TID) | ORAL | 0 refills | Status: AC | PRN

## 2020-04-15 NOTE — ED Notes (Signed)
Report to jennifer, rn.  

## 2020-04-15 NOTE — Discharge Summary (Signed)
[facility] [place] NAME: [name]    MR#:  [id number]  DATE OF BIRTH:  16-May-1971  DATE OF ADMISSION:  04/13/2020 ADMITTING PHYSICIAN: Loletha Grayer, MD  DATE OF DISCHARGE: 04/15/2020 11:58 AM  PRIMARY CARE PHYSICIAN: Steele Sizer, MD    ADMISSION DIAGNOSIS:  Pneumonia due to COVID-19 virus [U07.1, J12.82]  DISCHARGE DIAGNOSIS:  Active Problems:   Morbid obesity with BMI of 50.0-59.9, adult ([place])   Pure hypercholesterolemia   Hemiparesis affecting right side as late effect of cerebrovascular accident (CVA) ([place])   Pneumonia due to COVID-19 virus   Acute respiratory failure due to COVID-19 ([place])   Moderate persistent asthma with exacerbation   SECONDARY DIAGNOSIS:   Past Medical History:  Diagnosis Date  . Asthma   . Difficulty breathing   . Family history of breast cancer    BRCA testing letter sent  . Sinus complaint   . Stroke [facility])     HOSPITAL COURSE:   1.  COVID-19 pneumonia.  The patient was feeling better today.  She held her saturations with ambulation.  The patient received day 3 of remdesivir here today prior to discharge.  Was set up with 2 more doses of remdesivir as outpatient for tomorrow and the following day.  Prescribed prednisone into pharmacy yesterday I told her she can drop off 1 day and take 7 more days.  Continue inhalers.  Continue zinc and vitamin C. 2.  Asthma acute on chronic moderate persistent.  The patient has her inhalers and nebulizers at home.  Continue steroids to completion. 3.  Acute respiratory failure secondary to Covid this has resolved.  Holding her saturations off oxygen. 4.  Morbid obesity with a BMI of 56.87 5.  History of ischemic stroke on aspirin and statin 6.  Depression on Lexapro 7.  Impaired fasting glucose secondary to being on steroids. 8.  Vaginal yeast infection yesterday given a dose of Diflucan.  I will also give the Diflucan as needed to go home  with.  DISCHARGE CONDITIONS:   Satisfactory  CONSULTS OBTAINED:  None  DRUG ALLERGIES:   Allergies  Allergen Reactions  . Advil [Ibuprofen] Other (See Comments)    Other Reaction: Other reaction-swells  . Percocet [Oxycodone-Acetaminophen] Nausea And Vomiting and Other (See Comments)    headaches  . Azithromycin Rash    PATIENT UNSURE IF RASH WAS ACTUALLY DUE TO ABX     DISCHARGE MEDICATIONS:   Allergies as of 04/15/2020      Reactions   Advil [ibuprofen] Other (See Comments)   Other Reaction: Other reaction-swells   Percocet [oxycodone-acetaminophen] Nausea And Vomiting, Other (See Comments)   headaches   Azithromycin Rash   PATIENT UNSURE IF RASH WAS ACTUALLY DUE TO ABX       Medication List    STOP taking these medications   Dupixent 300 MG/2ML Sopn Generic drug: Dupilumab     TAKE these medications   albuterol 108 (90 Base) MCG/ACT inhaler Commonly known as: Ventolin HFA Inhale 2 puffs into the lungs as needed (for shortness of breath).   ascorbic acid 250 MG tablet Commonly known as: VITAMIN C Take 1 tablet (250 mg total) by mouth daily.   aspirin 81 MG chewable tablet Chew 1 tablet (81 mg total) by mouth daily.   atorvastatin 40 MG tablet Commonly known as: LIPITOR Take 1 tablet by mouth daily.   escitalopram 5 MG tablet Commonly known as: Lexapro Take 1 tablet (5 mg total)  by mouth daily.   fluconazole 150 MG tablet Commonly known as: Diflucan Take 1 tablet (150 mg total) by mouth once as needed for up to 1 dose (yeast infection).   mometasone-formoterol 200-5 MCG/ACT Aero Commonly known as: DULERA Inhale 2 puffs into the lungs 2 (two) times daily.   montelukast 10 MG tablet Commonly known as: SINGULAIR Take 1 tablet by mouth daily.   ondansetron 4 MG tablet Commonly known as: Zofran Take 1 tablet (4 mg total) by mouth every 8 (eight) hours as needed for nausea or vomiting.   predniSONE 20 MG tablet Commonly known as: DELTASONE Two  tabs po daily for 8 days   theophylline 300 MG 12 hr tablet Commonly known as: THEODUR Take 300 mg by mouth daily.   vitamin B-12 1000 MCG tablet Commonly known as: CYANOCOBALAMIN Take 1,000 mcg by mouth daily.   zinc sulfate 220 (50 Zn) MG capsule Take 1 capsule (220 mg total) by mouth daily.        DISCHARGE INSTRUCTIONS:   Follow-up PMD 2 weeks Follow-up tomorrow for remdesivir infusion and the following day for remdesivir infusion  If you experience worsening of your admission symptoms, develop shortness of breath, life threatening emergency, suicidal or homicidal thoughts you must seek medical attention immediately by calling 911 or calling your MD immediately  if symptoms less severe.  You Must read complete instructions/literature along with all the possible adverse reactions/side effects for all the Medicines you take and that have been prescribed to you. Take any new Medicines after you have completely understood and accept all the possible adverse reactions/side effects.   Please note  You were cared for by a hospitalist during your hospital stay. If you have any questions about your discharge medications or the care you received while you were in the hospital after you are discharged, you can call the unit and asked to speak with the hospitalist on call if the hospitalist that took care of you is not available. Once you are discharged, your primary care physician will handle any further medical issues. Please note that NO REFILLS for any discharge medications will be authorized once you are discharged, as it is imperative that you return to your primary care physician (or establish a relationship with a primary care physician if you do not have one) for your aftercare needs so that they can reassess your need for medications and monitor your lab values.    Today   CHIEF COMPLAINT:   Chief Complaint  Patient presents with  . Shortness of Breath    HISTORY OF PRESENT  ILLNESS:  [name]  is a 49 y.o. female came in with shortness of breath and found to be Covid positive   VITAL SIGNS:  Blood pressure (!) 130/91, pulse 73, temperature 98.3 F (36.8 C), temperature source Oral, resp. rate 18, weight (!) 136.5 kg, SpO2 98 %.  I/O:  No intake or output data in the 24 hours ending 04/15/20 1304  PHYSICAL EXAMINATION:  GENERAL:  49 y.o.-year-old patient lying in the bed with no acute distress.  EYES:  No scleral icterus.  HEENT: Head atraumatic, normocephalic. Oropharynx and nasopharynx clear.  LUNGS: Decreased breath sounds bilateral bases, no wheezing, rales,rhonchi or crepitation. No use of accessory muscles of respiration.  CARDIOVASCULAR: S1, S2 normal. No murmurs, rubs, or gallops.  ABDOMEN: Soft, non-tender, non-distended.  EXTREMITIES: No pedal edema.  NEUROLOGIC: Cranial nerves II through XII are intact. Muscle strength 5/5 in all extremities. Sensation intact. Gait  not checked.  PSYCHIATRIC: The patient is alert and oriented x 3.  SKIN: No obvious rash, lesion, or ulcer.   DATA REVIEW:   CBC Recent Labs  Lab 04/15/20 0731  WBC 7.3  HGB 13.2  HCT 38.2  PLT 323    Chemistries  Recent Labs  Lab 04/15/20 0731  NA 139  K 4.0  CL 106  CO2 25  GLUCOSE 162*  BUN 14  CREATININE 0.55  CALCIUM 8.7*  MG 2.2  AST 28  ALT 22  ALKPHOS 45  BILITOT 0.4     Microbiology Results  Results for orders placed or performed during the hospital encounter of 04/13/20  SARS Coronavirus 2 by RT PCR (hospital order, performed in [facility] hospital lab) Nasopharyngeal Nasopharyngeal Swab     Status: Abnormal   Collection Time: 04/13/20  9:00 PM   Specimen: Nasopharyngeal Swab  Result Value Ref Range Status   SARS Coronavirus 2 POSITIVE (A) NEGATIVE Final    Comment: RESULT CALLED TO, READ BACK BY AND VERIFIED WITH: LEXI OLIVER @2224  ON 04/13/20 SK[facility] (NOTE) SARS-CoV-2 target nucleic acids are DETECTED  SARS-CoV-2 RNA is generally  detectable in upper respiratory specimens  during the acute phase of infection.  Positive results are indicative  of the presence of the identified virus, but do not rule out bacterial infection or co-infection with other pathogens not detected by the test.  Clinical correlation with patient history and  other diagnostic information is necessary to determine patient infection status.  The expected result is negative.  Fact Sheet for Patients:   StrictlyIdeas.no   Fact Sheet for Healthcare Providers:   BankingDealers.co.za    This test is not yet approved or cleared by the [place] FDA and  has been authorized for detection and/or diagnosis of SARS-CoV-2 by FDA under an Emergency Use Authorization (EUA).  This EUA will remain in effect (meaning this test  can be used) for the duration of  the COVID-19 declaration under Section 564(b)(1) of the Act, 21 U.S.C. section 360-bbb-3(b)(1), unless the authorization is terminated or revoked sooner.  Performed at [facility], [address] [place], [place] [address]   Culture, blood (Routine X 2) w Reflex to ID Panel     Status: None (Preliminary result)   Collection Time: 04/14/20 12:19 AM   Specimen: BLOOD  Result Value Ref Range Status   Specimen Description BLOOD BOTTLES DRAWN AEROBIC AND ANAEROBIC  Final   Special Requests BLOOD RIGHT WRIST Blood Culture adequate volume  Final   Culture   Final    NO GROWTH 1 DAY Performed at [facility], [address] [place], [place] [address]    Report Status PENDING  Incomplete  Culture, blood (Routine X 2) w Reflex to ID Panel     Status: None (Preliminary result)   Collection Time: 04/14/20 12:19 AM   Specimen: BLOOD  Result Value Ref Range Status   Specimen Description BLOOD BLOOD LEFT HAND  Final   Special Requests   Final    BOTTLES DRAWN AEROBIC AND ANAEROBIC Blood Culture results may not be optimal due to an  inadequate volume of blood received in culture bottles   Culture   Final    NO GROWTH 1 DAY Performed at [facility], [address], [place], [place] [address]    Report Status PENDING  Incomplete    RADIOLOGY:  CT ABDOMEN PELVIS W CONTRAST  Result Date: 04/13/2020 CLINICAL DATA:  Left lower quadrant abdominal pain,  fever EXAM: CT ABDOMEN AND PELVIS WITH CONTRAST TECHNIQUE: Multidetector CT imaging of the abdomen and pelvis was performed using the standard protocol following bolus administration of intravenous contrast. CONTRAST:  146m OMNIPAQUE IOHEXOL 350 MG/ML SOLN COMPARISON:  11/02/2017 FINDINGS: Lower chest: Patchy pulmonary infiltrates are noted at the lung bases bilaterally, likely infectious or inflammatory in nature and compatible with though seen with COVID-19 pneumonia. The visualized heart and pericardium are unremarkable. Hepatobiliary: Tiny probable hepatic cysts. Liver and gallbladder are otherwise unremarkable. No intra or extrahepatic biliary ductal dilation. 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 The spleen is normal in size. Adrenals/Urinary Tract: Adrenal glands and kidneys are normal. Bladder is unremarkable. Stomach/Bowel: Bladder and small bowel are unremarkable. Mild sigmoid diverticulosis without superimposed inflammatory change. The large bowel is otherwise unremarkable. Appendix normal. No free intraperitoneal gas or fluid. Vascular/Lymphatic: No pathologic adenopathy within the abdomen and pelvis. Circumaortic left renal vein. Abdominal vasculature is otherwise normal. Reproductive: Uterus and bilateral adnexa are unremarkable. Other: Rectum unremarkable Musculoskeletal: Degenerative changes are seen within the lumbar spine, particularly at L4-5 with synovial hypertrophy involving the facets and minimal  anterolisthesis. No acute bone abnormality. IMPRESSION: 1. Patchy pulmonary infiltrates at the lung bases bilaterally, likely infectious or inflammatory in nature and compatible with COVID-19 pneumonia. 2. Mild sigmoid diverticulosis without superimposed inflammatory change. No definite radiographic explanation for the patient's reported left lower quadrant abdominal pain. Electronically Signed   By: AFidela SalisburyMD   On: 04/13/2020 23:01   DG Chest Portable 1 View  Result Date: 04/13/2020 CLINICAL DATA:  Fever and shortness of breath. Wheezing. Hypoxic asthma exacerbation with fever. EXAM: PORTABLE CHEST 1 VIEW COMPARISON:  Radiograph 08/27/2019 FINDINGS: Again seen hyperinflation and peribronchial thickening. Stable heart size and mediastinal contours. No focal airspace disease. No pneumothorax or large pleural effusion. No acute osseous abnormalities are seen. IMPRESSION: Hyperinflation and peribronchial thickening, consistent with asthma. No focal airspace disease. Electronically Signed   By: MKeith RakeM.D.   On: 04/13/2020 21:30     Management plans discussed with the patient, and she is in agreement.  Deferred me calling family early this morning. CODE STATUS:     Code Status Orders  (From admission, onward)         Start     Ordered   04/14/20 1234  Full code  Continuous        04/14/20 1233        Code Status History    Date Active Date Inactive Code Status Order ID Comments User Context   07/11/2018 1550 07/13/2018 2035 Full Code [id number] PDustin Flock MD ED   Advance Care Planning Activity      TOTAL TIME TAKING CARE OF THIS PATIENT: 32 minutes.    RLoletha GrayerM.D on 04/15/2020 at 1:04 PM  Between 7am to 6pm - Pager - [phone] After 6pm go to [email or web address] - password [email or web address]  [facility]  CC: Primary care physician; SSteele Sizer MD

## 2020-04-15 NOTE — ED Notes (Signed)
Pt up to restroom without difficulty.  

## 2020-04-15 NOTE — ED Notes (Signed)
Marcie Bal, ed tech attempt am lab draw without success. To notify lab of need for venipuncture assist for am labs.

## 2020-04-16 ENCOUNTER — Ambulatory Visit (HOSPITAL_COMMUNITY)
Admit: 2020-04-16 | Discharge: 2020-04-16 | Disposition: A | Discharge status: Home / Self Care | Payer: HRSA Program | Source: Ambulatory Visit | Attending: Pulmonary Disease | Admitting: Pulmonary Disease

## 2020-04-16 DIAGNOSIS — U071 COVID-19: Secondary | ICD-10-CM | POA: Insufficient documentation

## 2020-04-16 DIAGNOSIS — J1282 Pneumonia due to coronavirus disease 2019: Secondary | ICD-10-CM | POA: Insufficient documentation

## 2020-04-16 MED ORDER — SODIUM CHLORIDE 0.9 % IV SOLN
100.0000 mg | Freq: Once | INTRAVENOUS | Status: AC
  Administered 2020-04-16: 100 mg via INTRAVENOUS
  Filled 2020-04-16: qty 20

## 2020-04-16 MED ORDER — EPINEPHRINE 0.3 MG/0.3ML IJ SOAJ: 0.3000 mg | Freq: Once | INTRAMUSCULAR | Status: DC | PRN

## 2020-04-16 MED ORDER — DIPHENHYDRAMINE HCL 50 MG/ML IJ SOLN: 50.0000 mg | Freq: Once | INTRAMUSCULAR | Status: DC | PRN

## 2020-04-16 MED ORDER — FAMOTIDINE IN NACL 20-0.9 MG/50ML-% IV SOLN: 20.0000 mg | Freq: Once | INTRAVENOUS | Status: DC | PRN

## 2020-04-16 MED ORDER — METHYLPREDNISOLONE SODIUM SUCC 125 MG IJ SOLR: 125.0000 mg | Freq: Once | INTRAMUSCULAR | Status: DC | PRN

## 2020-04-16 MED ORDER — SODIUM CHLORIDE 0.9 % IV SOLN: INTRAVENOUS | Status: DC | PRN

## 2020-04-16 MED ORDER — ALBUTEROL SULFATE HFA 108 (90 BASE) MCG/ACT IN AERS: 2.0000 | INHALATION_SPRAY | Freq: Once | RESPIRATORY_TRACT | Status: DC | PRN

## 2020-04-16 NOTE — Progress Notes (Signed)
  Diagnosis: COVID-19  Physician: Dr. Joya Gaskins  Procedure: [facility] Infusion Clinic Med: remdesivir infusion - Provided patient with remdesivir fact sheet for patients, parents and caregivers prior to infusion.  Complications: No immediate complications noted.  Discharge: Discharged home   Maretta Bees [facility] 04/16/2020

## 2020-04-16 NOTE — Discharge Instructions (Signed)
10 Things You Can Do to Manage Your COVID-19 Symptoms at Home If you have possible or confirmed COVID-19: 1. Stay home from work and school. And stay away from other public places. If you must go out, avoid using any kind of public transportation, ridesharing, or taxis. 2. Monitor your symptoms carefully. If your symptoms get worse, call your healthcare provider immediately. 3. Get rest and stay hydrated. 4. If you have a medical appointment, call the healthcare provider ahead of time and tell them that you have or may have COVID-19. 5. For medical emergencies, call 911 and notify the dispatch personnel that you have or may have COVID-19. 6. Cover your cough and sneezes with a tissue or use the inside of your elbow. 7. Wash your hands often with soap and water for at least 20 seconds or clean your hands with an alcohol-based hand sanitizer that contains at least 60% alcohol. 8. As much as possible, stay in a specific room and away from other people in your home. Also, you should use a separate bathroom, if available. If you need to be around other people in or outside of the home, wear a mask. 9. Avoid sharing personal items with other people in your household, like dishes, towels, and bedding. 10. Clean all surfaces that are touched often, like counters, tabletops, and doorknobs. Use household cleaning sprays or wipes according to the label instructions. [email or web address].[email or web address] 02/08/2019 This information is not intended to replace advice given to you by your health care provider. Make sure you discuss any questions you have with your health care provider. Document Revised: 07/13/2019 Document Reviewed: 07/13/2019 Elsevier Patient Education  [place]. What types of side effects do monoclonal antibody drugs cause?  Common side effects  In general, the more common side effects caused by monoclonal antibody drugs include: . Allergic reactions, such as hives or itching . Flu-like signs and  symptoms, including chills, fatigue, fever, and muscle aches and pains . Nausea, vomiting . Diarrhea . Skin rashes . Low blood pressure   The CDC is recommending patients who receive monoclonal antibody treatments wait at least 90 days before being vaccinated.  Currently, there are no data on the safety and efficacy of mRNA COVID-19 vaccines in persons who received monoclonal antibodies or convalescent plasma as part of COVID-19 treatment. Based on the estimated half-life of such therapies as well as evidence suggesting that reinfection is uncommon in the 90 days after initial infection, vaccination should be deferred for at least 90 days, as a precautionary measure until additional information becomes available, to avoid interference of the antibody treatment with vaccine-induced immune response

## 2020-04-17 ENCOUNTER — Ambulatory Visit (HOSPITAL_COMMUNITY)
Admit: 2020-04-17 | Discharge: 2020-04-17 | Disposition: A | Discharge status: Home / Self Care | Payer: HRSA Program | Attending: Pulmonary Disease | Admitting: Pulmonary Disease

## 2020-04-17 DIAGNOSIS — U071 COVID-19: Secondary | ICD-10-CM | POA: Diagnosis not present

## 2020-04-17 MED ORDER — EPINEPHRINE 0.3 MG/0.3ML IJ SOAJ: 0.3000 mg | Freq: Once | INTRAMUSCULAR | Status: DC | PRN

## 2020-04-17 MED ORDER — FAMOTIDINE IN NACL 20-0.9 MG/50ML-% IV SOLN: 20.0000 mg | Freq: Once | INTRAVENOUS | Status: DC | PRN

## 2020-04-17 MED ORDER — ALBUTEROL SULFATE HFA 108 (90 BASE) MCG/ACT IN AERS: 2.0000 | INHALATION_SPRAY | Freq: Once | RESPIRATORY_TRACT | Status: DC | PRN

## 2020-04-17 MED ORDER — SODIUM CHLORIDE 0.9 % IV SOLN
100.0000 mg | Freq: Once | INTRAVENOUS | Status: AC
  Administered 2020-04-17: 100 mg via INTRAVENOUS
  Filled 2020-04-17: qty 20

## 2020-04-17 MED ORDER — METHYLPREDNISOLONE SODIUM SUCC 125 MG IJ SOLR: 125.0000 mg | Freq: Once | INTRAMUSCULAR | Status: DC | PRN

## 2020-04-17 MED ORDER — SODIUM CHLORIDE 0.9 % IV SOLN: INTRAVENOUS | Status: DC | PRN

## 2020-04-17 MED ORDER — DIPHENHYDRAMINE HCL 50 MG/ML IJ SOLN: 50.0000 mg | Freq: Once | INTRAMUSCULAR | Status: DC | PRN

## 2020-04-17 NOTE — Discharge Instructions (Signed)
10 Things You Can Do to Manage Your COVID-19 Symptoms at Home If you have possible or confirmed COVID-19: 1. Stay home from work and school. And stay away from other public places. If you must go out, avoid using any kind of public transportation, ridesharing, or taxis. 2. Monitor your symptoms carefully. If your symptoms get worse, call your healthcare provider immediately. 3. Get rest and stay hydrated. 4. If you have a medical appointment, call the healthcare provider ahead of time and tell them that you have or may have COVID-19. 5. For medical emergencies, call 911 and notify the dispatch personnel that you have or may have COVID-19. 6. Cover your cough and sneezes with a tissue or use the inside of your elbow. 7. Wash your hands often with soap and water for at least 20 seconds or clean your hands with an alcohol-based hand sanitizer that contains at least 60% alcohol. 8. As much as possible, stay in a specific room and away from other people in your home. Also, you should use a separate bathroom, if available. If you need to be around other people in or outside of the home, wear a mask. 9. Avoid sharing personal items with other people in your household, like dishes, towels, and bedding. 10. Clean all surfaces that are touched often, like counters, tabletops, and doorknobs. Use household cleaning sprays or wipes according to the label instructions. cdc.gov/coronavirus 02/08/2019 This information is not intended to replace advice given to you by your health care provider. Make sure you discuss any questions you have with your health care provider. Document Revised: 07/13/2019 Document Reviewed: 07/13/2019 Elsevier Patient Education  2020 Elsevier Inc.  Remdesivir What is this medicine? REMDESIVIR (rem DE si veer) is an antiviral medicine used to treat COVID-19. It will not work for colds, flu, or other viral infections. This medicine may be used for other purposes; ask your health care  provider or pharmacist if you have questions. COMMON BRAND NAME(S): Veklury What should I tell my health care provider before I take this medicine? They need to know if you have any of these conditions:  kidney disease  liver disease  an unusual or allergic reaction to remdesivir, other medicines, foods, dyes, or preservatives  pregnant or trying to get pregnant  breast-feeding How should I use this medicine? This medicine is for infusion into a vein. It is given by a health care professional in a hospital setting. Talk to your pediatrician regarding the use of this medicine in children. While this drug may be prescribed for selected conditions, precautions do apply. Overdosage: If you think you have taken too much of this medicine contact a poison control center or emergency room at once. NOTE: This medicine is only for you. Do not share this medicine with others. What if I miss a dose? This does not apply. What may interact with this medicine? Do not take this medicine with any of the following medications:  certain medicines for seizures like carbamazepine, phenobarbital, phenytoin, primidone  chloroquine  hydroxychloroquine  rifampin  rifapentine  St. John's wort This medicine may also interact with the following medications:  dexamethasone  eslicarbazepine  oxcarbazepine  rifabutin  rufinamide This list may not describe all possible interactions. Give your health care provider a list of all the medicines, herbs, non-prescription drugs, or dietary supplements you use. Also tell them if you smoke, drink alcohol, or use illegal drugs. Some items may interact with your medicine. What should I watch for while using this medicine?   Your condition will be monitored carefully while you are receiving this medicine. Visit your doctor or health care professional for regular check-ups. Tell your doctor if your symptoms do not start to get better or if they get worse. What  side effects may I notice from receiving this medicine? Side effects that you should report to your doctor or health care professional as soon as possible:  allergic reactions like skin rash, itching or hives, swelling of the face, lips, or tongue  breathing problems  fast, irregular heartbeat Side effects that usually do not require medical attention (report these to your doctor or health care professional if they continue or are bothersome):  constipation  diarrhea  headache  nausea This list may not describe all possible side effects. Call your doctor for medical advice about side effects. You may report side effects to FDA at 1-800-FDA-1088. Where should I keep my medicine? This drug is given in a hospital and will not be stored at home. NOTE: This sheet is a summary. It may not cover all possible information. If you have questions about this medicine, talk to your doctor, pharmacist, or health care provider.  2020 Elsevier/Gold Standard (2019-02-01 09:51:25)  

## 2020-04-17 NOTE — Progress Notes (Signed)
  Diagnosis: COVID-19  Physician: Dr Joya Gaskins  Procedure: [facility] Infusion Clinic Med: remdesivir infusion - Provided patient with remdesivir fact sheet for patients, parents and caregivers prior to infusion.  Complications: No immediate complications noted.  Discharge: Discharged home   [name] 04/17/2020

## 2020-04-19 LAB — CULTURE, BLOOD (ROUTINE X 2)
Culture: NO GROWTH
Culture: NO GROWTH
Special Requests: ADEQUATE

## 2020-05-02 DIAGNOSIS — Z029 Encounter for administrative examinations, unspecified: Secondary | ICD-10-CM

## 2020-06-28 ENCOUNTER — Encounter: Payer: Self-pay | Admitting: Emergency Medicine

## 2020-06-28 ENCOUNTER — Emergency Department: Payer: BC Managed Care – PPO

## 2020-06-28 ENCOUNTER — Other Ambulatory Visit: Payer: Self-pay

## 2020-06-28 ENCOUNTER — Emergency Department
Admission: EM | Admit: 2020-06-28 | Discharge: 2020-06-28 | Disposition: A | Discharge status: Home / Self Care | Payer: BC Managed Care – PPO | Attending: Student in an Organized Health Care Education/Training Program | Admitting: Student in an Organized Health Care Education/Training Program

## 2020-06-28 DIAGNOSIS — Z8616 Personal history of COVID-19: Secondary | ICD-10-CM | POA: Insufficient documentation

## 2020-06-28 DIAGNOSIS — Z7982 Long term (current) use of aspirin: Secondary | ICD-10-CM | POA: Insufficient documentation

## 2020-06-28 DIAGNOSIS — J45909 Unspecified asthma, uncomplicated: Secondary | ICD-10-CM | POA: Insufficient documentation

## 2020-06-28 DIAGNOSIS — R0789 Other chest pain: Secondary | ICD-10-CM | POA: Diagnosis not present

## 2020-06-28 DIAGNOSIS — R42 Dizziness and giddiness: Secondary | ICD-10-CM | POA: Diagnosis present

## 2020-06-28 LAB — CBC WITH DIFFERENTIAL/PLATELET
Abs Immature Granulocytes: 0.03 10*3/uL (ref 0.00–0.07)
Basophils Absolute: 0.1 10*3/uL (ref 0.0–0.1)
Basophils Relative: 1 %
Eosinophils Absolute: 0.7 10*3/uL — ABNORMAL HIGH (ref 0.0–0.5)
Eosinophils Relative: 6 %
HCT: 44.4 % (ref 36.0–46.0)
Hemoglobin: 14.4 g/dL (ref 12.0–15.0)
Immature Granulocytes: 0 %
Lymphocytes Relative: 30 %
Lymphs Abs: 3.8 10*3/uL (ref 0.7–4.0)
MCH: 28.9 pg (ref 26.0–34.0)
MCHC: 32.4 g/dL (ref 30.0–36.0)
MCV: 89.2 fL (ref 80.0–100.0)
Monocytes Absolute: 0.6 10*3/uL (ref 0.1–1.0)
Monocytes Relative: 4 %
Neutro Abs: 7.5 10*3/uL (ref 1.7–7.7)
Neutrophils Relative %: 59 %
Platelets: 409 10*3/uL — ABNORMAL HIGH (ref 150–400)
RBC: 4.98 MIL/uL (ref 3.87–5.11)
RDW: 14.8 % (ref 11.5–15.5)
WBC: 12.7 10*3/uL — ABNORMAL HIGH (ref 4.0–10.5)
nRBC: 0 % (ref 0.0–0.2)

## 2020-06-28 LAB — COMPREHENSIVE METABOLIC PANEL
ALT: 13 U/L (ref 0–44)
ALT: 14 U/L (ref 0–44)
AST: 25 U/L (ref 15–41)
AST: 17 U/L (ref 15–41)
Albumin: 3.3 g/dL — ABNORMAL LOW (ref 3.5–5.0)
Albumin: 3.6 g/dL (ref 3.5–5.0)
Alkaline Phosphatase: 84 U/L (ref 38–126)
Alkaline Phosphatase: 83 U/L (ref 38–126)
Anion gap: 12 (ref 5–15)
Anion gap: 13 (ref 5–15)
BUN: UNDETERMINED mg/dL (ref 6–20)
BUN: 12 mg/dL (ref 6–20)
CO2: 24 mmol/L (ref 22–32)
CO2: 25 mmol/L (ref 22–32)
Calcium: 8.9 mg/dL (ref 8.9–10.3)
Calcium: 8.6 mg/dL — ABNORMAL LOW (ref 8.9–10.3)
Chloride: 104 mmol/L (ref 98–111)
Chloride: 101 mmol/L (ref 98–111)
Creatinine, Ser: UNDETERMINED mg/dL (ref 0.44–1.00)
Creatinine, Ser: 0.64 mg/dL (ref 0.44–1.00)
GFR, Estimated: >60 mL/min (ref >60)
Glucose, Bld: 69 mg/dL — ABNORMAL LOW (ref 70–99)
Glucose, Bld: 80 mg/dL (ref 70–99)
Potassium: 3.7 mmol/L (ref 3.5–5.1)
Potassium: 3.4 mmol/L — ABNORMAL LOW (ref 3.5–5.1)
Sodium: 140 mmol/L (ref 135–145)
Sodium: 139 mmol/L (ref 135–145)
Total Bilirubin: 0.5 mg/dL (ref 0.3–1.2)
Total Bilirubin: 0.6 mg/dL (ref 0.3–1.2)
Total Protein: 7.1 g/dL (ref 6.5–8.1)
Total Protein: 7.3 g/dL (ref 6.5–8.1)

## 2020-06-28 LAB — TROPONIN I (HIGH SENSITIVITY)
Troponin I (High Sensitivity): 3 ng/L (ref <18)
Troponin I (High Sensitivity): 3 ng/L (ref <18)

## 2020-06-28 LAB — FIBRIN DERIVATIVES D-DIMER (ARMC ONLY): Fibrin derivatives D-dimer (ARMC): 758.47 ng/mL (FEU) — ABNORMAL HIGH (ref 0.00–499.00)

## 2020-06-28 MED ORDER — METHYLPREDNISOLONE SODIUM SUCC 125 MG IJ SOLR
125.0000 mg | Freq: Once | INTRAMUSCULAR | Status: AC
  Administered 2020-06-28: 125 mg via INTRAVENOUS
  Filled 2020-06-28: qty 2

## 2020-06-28 MED ORDER — PREDNISONE 10 MG PO TABS: ORAL_TABLET | ORAL | 0 refills | Status: DC

## 2020-06-28 MED ORDER — IPRATROPIUM-ALBUTEROL 0.5-2.5 (3) MG/3ML IN SOLN
3.0000 mL | Freq: Once | RESPIRATORY_TRACT | Status: AC
  Administered 2020-06-28: 3 mL via RESPIRATORY_TRACT
  Filled 2020-06-28: qty 3

## 2020-06-28 MED ORDER — IOHEXOL 350 MG/ML SOLN
75.0000 mL | Freq: Once | INTRAVENOUS | Status: AC | PRN
  Administered 2020-06-28: 75 mL via INTRAVENOUS

## 2020-06-28 MED ORDER — ALBUTEROL SULFATE (2.5 MG/3ML) 0.083% IN NEBU
2.5000 mg | INHALATION_SOLUTION | Freq: Once | RESPIRATORY_TRACT | Status: AC
  Administered 2020-06-28: 2.5 mg via RESPIRATORY_TRACT
  Filled 2020-06-28: qty 3

## 2020-06-28 NOTE — ED Triage Notes (Signed)
Pt to ER with c/o chest pain and dizziness starting yesterday.  Pt denies n/v, states it feels hard to breathe.  Pt states hx of asthma.  Pt had COVID in September.

## 2020-06-28 NOTE — ED Notes (Signed)
This RN to bedside at this time. Pt c/o central chest pressure and states "it's not really a pain, it's just pressure and it's a little hard to breathe"  Pt states no N/V/D, just dizziness. Pt states she has not had this type of pain before. Pt has hx of stroke in 2019.   PT NAD at this time, VSS.

## 2020-06-28 NOTE — ED Notes (Signed)
Per Lab tech, lab was not able to get a BUN/Creatinine off the current blood sample. Lab tech asked to come and redraw, along with draw the 1302 troponin due. Lab tech agreed and will be coming down for redraw at this time.

## 2020-06-28 NOTE — ED Notes (Signed)
IV team consult placed due to difficult stick at this time

## 2020-06-28 NOTE — ED Notes (Signed)
Pt to X-Ray at this time.

## 2020-06-28 NOTE — ED Provider Notes (Signed)
[facility] Emergency Department Provider Note    First MD Initiated Contact with Patient 06/28/20 1015     (approximate)  I have reviewed the triage vital signs and the nursing notes.   HISTORY  Chief Complaint Chest Pain and Dizziness    HPI [name] is a 49 y.o. female the below listed past medical history presents to the ER for dizziness feeling "woozy lightheaded for the past 24 hours.  Was treated for recent asthma exacerbation with prednisone.  She is having some tightness in her chest.  Does not feel that she is having much wheezing no fevers.  Denies any abdominal pain.  No numbness or tingling.  No nausea or vomiting.    Past Medical History:  Diagnosis Date  . Asthma   . Difficulty breathing   . Family history of breast cancer    BRCA testing letter sent  . Sinus complaint   . Stroke [facility])    Family History  Problem Relation Age of Onset  . Diabetes Mother   . Uterine cancer Mother   . Colon cancer Mother   . Breast cancer Mother        30s/40s  . Cancer - Lung Father   . Colon cancer Father   . Diverticulitis Brother   . Hernia Brother        x2 surgeries  . Colon cancer Paternal Grandmother   . Lung cancer Paternal Grandmother    Past Surgical History:  Procedure Laterality Date  . CESAREAN SECTION  1989  . COLONOSCOPY    . COLONOSCOPY N/A 07/11/2019   Procedure: COLONOSCOPY;  Surgeon: Virgel Manifold, MD;  Location: [facility] ENDOSCOPY;  Service: Endoscopy;  Laterality: N/A;  . FOOT SURGERY Left 2013  . HERNIA REPAIR     Patient Active Problem List   Diagnosis Date Noted  . Impaired fasting glucose   . Acute respiratory failure due to COVID-19 ([place]) 04/14/2020  . Moderate persistent asthma with exacerbation 04/14/2020  . Hx of arterial ischemic stroke   . Depression   . Pneumonia due to COVID-19 virus 04/13/2020  . Family history of colon cancer requiring screening colonoscopy   . Polyp of colon   .  Vasomotor flushing 06/20/2019  . Hemiparesis affecting right side as late effect of cerebrovascular accident (CVA) ([place]) 09/07/2018  . Imbalance 09/07/2018  . Lymphedema of both lower extremities 05/30/2018  . Moderate episode of recurrent major depressive disorder ([place]) 05/30/2018  . Pure hypercholesterolemia 05/30/2018  . Menorrhagia with regular cycle 10/12/2017  . Morbid obesity with BMI of 50.0-59.9, adult ([place]) 01/27/2017  . History of vitamin D deficiency 01/27/2017  . Fibroids 01/27/2017  . Asthma, well controlled 01/15/2014      Prior to Admission medications   Medication Sig Start Date End Date Taking? Authorizing Provider  albuterol (VENTOLIN HFA) 108 (90 Base) MCG/ACT inhaler Inhale 2 puffs into the lungs as needed (for shortness of breath). 08/27/19   Merlyn Lot, MD  ascorbic acid (VITAMIN C) 250 MG tablet Take 1 tablet (250 mg total) by mouth daily. 04/14/20   Loletha Grayer, MD  aspirin 81 MG chewable tablet Chew 1 tablet (81 mg total) by mouth daily. 07/13/18   Gladstone Lighter, MD  atorvastatin (LIPITOR) 40 MG tablet Take 1 tablet by mouth daily.    [provider]  escitalopram (LEXAPRO) 5 MG tablet Take 1 tablet (5 mg total) by mouth daily. 09/13/18   Steele Sizer, MD  fluconazole (DIFLUCAN) 150  MG tablet Take 1 tablet (150 mg total) by mouth once as needed for up to 1 dose (yeast infection). 04/15/20   Loletha Grayer, MD  mometasone-formoterol (DULERA) 200-5 MCG/ACT AERO Inhale 2 puffs into the lungs 2 (two) times daily. 01/14/17   Erby Pian, MD  montelukast (SINGULAIR) 10 MG tablet Take 1 tablet by mouth daily. 04/22/18 04/14/20  Erby Pian, MD  ondansetron (ZOFRAN) 4 MG tablet Take 1 tablet (4 mg total) by mouth every 8 (eight) hours as needed for nausea or vomiting. 04/15/20   Loletha Grayer, MD  predniSONE (DELTASONE) 20 MG tablet Two tabs po daily for 8 days 04/15/20   Loletha Grayer, MD  theophylline (THEODUR) 300 MG 12 hr tablet Take  300 mg by mouth daily. 06/05/19   [provider]  vitamin B-12 (CYANOCOBALAMIN) 1000 MCG tablet Take 1,000 mcg by mouth daily.    [provider]  zinc sulfate 220 (50 Zn) MG capsule Take 1 capsule (220 mg total) by mouth daily. 04/14/20   Loletha Grayer, MD    Allergies Advil [ibuprofen], Percocet [oxycodone-acetaminophen], and Azithromycin    Social History Social History   Tobacco Use  . Smoking status: Never Smoker  . Smokeless tobacco: Never Used  Vaping Use  . Vaping Use: Never used  Substance Use Topics  . Alcohol use: Yes    Comment: occasionally  . Drug use: No    Review of Systems Patient denies headaches, rhinorrhea, blurry vision, numbness, shortness of breath, chest pain, edema, cough, abdominal pain, nausea, vomiting, diarrhea, dysuria, fevers, rashes or hallucinations unless otherwise stated above in HPI. ____________________________________________   PHYSICAL EXAM:  VITAL SIGNS: Vitals:   06/28/20 1400 06/28/20 1430  BP: 114/67 117/72  Pulse: 72 81  Resp: 19 18  Temp:    SpO2: 100% 100%    Constitutional: Alert and oriented.  Eyes: Conjunctivae are normal.  Head: Atraumatic. Nose: No congestion/rhinnorhea. Mouth/Throat: Mucous membranes are moist.   Neck: No stridor. Painless ROM.  Cardiovascular: Normal rate, regular rhythm. Grossly normal heart sounds.  Good peripheral circulation. Respiratory: Normal respiratory effort.  No retractions. Lungs with diffuse expiratory wheeze Gastrointestinal: Soft and nontender. No distention. No abdominal bruits. No CVA tenderness. Genitourinary: deferred Musculoskeletal: No lower extremity tenderness nor edema.  No joint effusions. Neurologic:  Normal speech and language. No gross focal neurologic deficits are appreciated. No facial droop Skin:  Skin is warm, dry and intact. No rash noted. Psychiatric: Mood and affect are normal. Speech and behavior are  normal.  ____________________________________________   LABS (all labs ordered are listed, but only abnormal results are displayed)  Results for orders placed or performed during the hospital encounter of 06/28/20 (from the past 24 hour(s))  Troponin I (High Sensitivity)     Status: None   Collection Time: 06/28/20 11:18 AM  Result Value Ref Range   Troponin I (High Sensitivity) 3 <18 ng/L  Fibrin derivatives D-Dimer ([facility] only)     Status: Abnormal   Collection Time: 06/28/20 11:18 AM  Result Value Ref Range   Fibrin derivatives D-dimer ([facility]) 758.47 (H) 0.00 - 499.00 ng/mL (FEU)  CBC with Differential     Status: Abnormal   Collection Time: 06/28/20 11:18 AM  Result Value Ref Range   WBC 12.7 (H) 4.0 - 10.5 K/uL   RBC 4.98 3.87 - 5.11 MIL/uL   Hemoglobin 14.4 12.0 - 15.0 g/dL   HCT 44.4 36 - 46 %   MCV 89.2 80.0 - 100.0 fL  MCH 28.9 26.0 - 34.0 pg   MCHC 32.4 30.0 - 36.0 g/dL   RDW 14.8 11.5 - 15.5 %   Platelets 409 (H) 150 - 400 K/uL   nRBC 0.0 0.0 - 0.2 %   Neutrophils Relative % 59 %   Neutro Abs 7.5 1.7 - 7.7 K/uL   Lymphocytes Relative 30 %   Lymphs Abs 3.8 0.7 - 4.0 K/uL   Monocytes Relative 4 %   Monocytes Absolute 0.6 0.1 - 1.0 K/uL   Eosinophils Relative 6 %   Eosinophils Absolute 0.7 (H) 0.0 - 0.5 K/uL   Basophils Relative 1 %   Basophils Absolute 0.1 0.0 - 0.1 K/uL   Immature Granulocytes 0 %   Abs Immature Granulocytes 0.03 0.00 - 0.07 K/uL  Comprehensive metabolic panel     Status: Abnormal   Collection Time: 06/28/20 11:18 AM  Result Value Ref Range   Sodium 140 135 - 145 mmol/L   Potassium 3.7 3.5 - 5.1 mmol/L   Chloride 104 98 - 111 mmol/L   CO2 24 22 - 32 mmol/L   Glucose, Bld 69 (L) 70 - 99 mg/dL   BUN QUANTITY NOT SUFFICIENT, UNABLE TO PERFORM TEST 6 - 20 mg/dL   Creatinine, Ser QUANTITY NOT SUFFICIENT, UNABLE TO PERFORM TEST 0.44 - 1.00 mg/dL   Calcium 8.9 8.9 - 10.3 mg/dL   Total Protein 7.1 6.5 - 8.1 g/dL   Albumin 3.3 (L) 3.5 - 5.0 g/dL    AST 25 15 - 41 U/L   ALT 13 0 - 44 U/L   Alkaline Phosphatase 84 38 - 126 U/L   Total Bilirubin 0.5 0.3 - 1.2 mg/dL   GFR, Estimated NOT CALCULATED >60 mL/min   Anion gap 12 5 - 15  Troponin I (High Sensitivity)     Status: None   Collection Time: 06/28/20  1:02 PM  Result Value Ref Range   Troponin I (High Sensitivity) 3 <18 ng/L  Comprehensive metabolic panel     Status: Abnormal   Collection Time: 06/28/20  1:02 PM  Result Value Ref Range   Sodium 139 135 - 145 mmol/L   Potassium 3.4 (L) 3.5 - 5.1 mmol/L   Chloride 101 98 - 111 mmol/L   CO2 25 22 - 32 mmol/L   Glucose, Bld 80 70 - 99 mg/dL   BUN 12 6 - 20 mg/dL   Creatinine, Ser 0.64 0.44 - 1.00 mg/dL   Calcium 8.6 (L) 8.9 - 10.3 mg/dL   Total Protein 7.3 6.5 - 8.1 g/dL   Albumin 3.6 3.5 - 5.0 g/dL   AST 17 15 - 41 U/L   ALT 14 0 - 44 U/L   Alkaline Phosphatase 83 38 - 126 U/L   Total Bilirubin 0.6 0.3 - 1.2 mg/dL   GFR, Estimated >60 >60 mL/min   Anion gap 13 5 - 15   ____________________________________________  EKG My review and personal interpretation at Time: 10:13   Indication: chest itghtness  Rate: 80  Rhythm: sinus Axis: normal Other: normal intervals, no stemi ____________________________________________  RADIOLOGY  I personally reviewed all radiographic images ordered to evaluate for the above acute complaints and reviewed radiology reports and findings.  These findings were personally discussed with the patient.  Please see medical record for radiology report.  ____________________________________________   PROCEDURES  Procedure(s) performed:  Procedures    Critical Care performed: no ____________________________________________   INITIAL IMPRESSION / ASSESSMENT AND PLAN / ED COURSE  Pertinent labs & imaging results that  were available during my care of the patient were reviewed by me and considered in my medical decision making (see chart for details).   DDX: Asthma, copd, CHF, pna, ptx,  malignancy, Pe, anemia   Kelee L McBroom Baird is a 49 y.o. who presents to the ED with symptoms as described above.  Patient nontoxic-appearing does have diffuse wheezing on exam I suspect acute bronchitis.  Chest x-ray without any evidence of edema or pneumothorax.  Blood will be sent for the but differential.  Will order D-dimer to further stratify for PE.  Clinical Course as of Jun 28 1509  Fri Jun 28, 2020  1347 Cardiac enzymes are negative.  This not consistent with ACS.   [PR]    Clinical Course User Index [PR] Merlyn Lot, MD   Patient still remains well-appearing.  Currently awaiting CTA.  Patient be signed out to oncoming provider pending results of CTA.  If negative will be appropriate for discharge home with outpatient follow-up with pulmonology will be started on prednisone taper.  Have discussed with the patient and available family all diagnostics and treatments performed thus far and all questions were answered to the best of my ability. The patient demonstrates understanding and agreement with plan.   The patient was evaluated in Emergency Department today for the symptoms described in the history of present illness. He/she was evaluated in the context of the global COVID-19 pandemic, which necessitated consideration that the patient might be at risk for infection with the SARS-CoV-2 virus that causes COVID-19. Institutional protocols and algorithms that pertain to the evaluation of patients at risk for COVID-19 are in a state of rapid change based on information released by regulatory bodies including the CDC and federal and state organizations. These policies and algorithms were followed during the patient's care in the ED.  As part of my medical decision making, I reviewed the following data within the [place] notes reviewed and incorporated, Labs reviewed, notes from prior ED visits and [place] Controlled Substance  Database   ____________________________________________   FINAL CLINICAL IMPRESSION(S) / ED DIAGNOSES  Final diagnoses:  Chest tightness      NEW MEDICATIONS STARTED DURING THIS VISIT:  New Prescriptions   No medications on file     Note:  This document was prepared using Dragon voice recognition software and may include unintentional dictation errors.    Merlyn Lot, MD 06/28/20 1510

## 2020-06-28 NOTE — ED Notes (Signed)
Lab at bedside

## 2020-06-28 NOTE — ED Notes (Signed)
Pt given graham crackers, peanut butter, and cola per request and EDP Quentin Cornwall verbal order for pt to eat something at this time.

## 2020-06-28 NOTE — Discharge Instructions (Addendum)
Please seek medical attention for any high fevers, chest pain, shortness of breath, change in behavior, persistent vomiting, bloody stool or any other new or concerning symptoms.  

## 2020-08-26 ENCOUNTER — Ambulatory Visit: Payer: BC Managed Care – PPO | Admitting: Obstetrics & Gynecology

## 2020-09-09 ENCOUNTER — Ambulatory Visit: Payer: Self-pay | Admitting: Obstetrics & Gynecology

## 2020-09-20 ENCOUNTER — Encounter: Payer: Self-pay | Admitting: Obstetrics & Gynecology

## 2020-09-20 ENCOUNTER — Ambulatory Visit (INDEPENDENT_AMBULATORY_CARE_PROVIDER_SITE_OTHER): Payer: BC Managed Care – PPO | Admitting: Obstetrics & Gynecology

## 2020-09-20 ENCOUNTER — Other Ambulatory Visit: Payer: Self-pay

## 2020-09-20 ENCOUNTER — Other Ambulatory Visit (HOSPITAL_COMMUNITY)
Admission: RE | Admit: 2020-09-20 | Discharge: 2020-09-20 | Disposition: A | Discharge status: Home / Self Care | Payer: BC Managed Care – PPO | Source: Ambulatory Visit | Attending: Obstetrics & Gynecology | Admitting: Obstetrics & Gynecology

## 2020-09-20 VITALS — BP 130/80 | Ht 61.0 in | Wt 301.0 lb

## 2020-09-20 DIAGNOSIS — Z1211 Encounter for screening for malignant neoplasm of colon: Secondary | ICD-10-CM

## 2020-09-20 DIAGNOSIS — Z1231 Encounter for screening mammogram for malignant neoplasm of breast: Secondary | ICD-10-CM

## 2020-09-20 DIAGNOSIS — Z01419 Encounter for gynecological examination (general) (routine) without abnormal findings: Secondary | ICD-10-CM | POA: Diagnosis not present

## 2020-09-20 DIAGNOSIS — Z124 Encounter for screening for malignant neoplasm of cervix: Secondary | ICD-10-CM | POA: Diagnosis not present

## 2020-09-20 MED ORDER — PHENTERMINE HCL 37.5 MG PO TABS: ORAL_TABLET | ORAL | 1 refills | Status: DC

## 2020-09-20 NOTE — Patient Instructions (Addendum)
PAP every three years Mammogram every year    Call [phone] to schedule at [facility] Colonoscopy every 10 years Labs yearly (with PCP)  Thank you for choosing [facility]. As part of our ongoing efforts to improve patient experience, we would appreciate your feedback. Please fill out the short survey that you will receive by mail or MyChart. Your opinion is important to [place]! - Dr. Kenton Kingfisher  Primary Care in the area  This is not meant to be comprehensive list, but a resource for some providers that you can call for counseling or medication needs. If you have a recommendation, please let us know.   [facility]:  [phone]               Dr Miguel Aschoff               Dr Juanetta Beets               Dr Lavon Paganini  [facility], [place]:   [phone]  Owens Loffler, MD  Waunita Schooner, MD  Ria Bush, MD  Jinny Sanders, MD  [facility], [place]: [phone]  Orland Mustard, MD   [facility], Cimicifuga racemosa oral dosage forms What is this medicine? BLACK COHOSH (blak KOH hosh) or Cimicifuga racemosa is a dietary supplement. It is promoted to relieve symptoms of menopause, such as hot flashes. The FDA has not approved this supplement for any medical use. This supplement may be used for other purposes; ask your health care provider or pharmacist if you have questions. This medicine may be used for other purposes; ask your health care provider or pharmacist if you have questions. What should I tell my health care provider before I take this medicine? They need to know if you have any of these conditions:  breast cancer  cervical, ovarian or uterine cancer  high blood pressure  infertility  liver disease  menstrual changes or irregular periods  unusual vaginal or uterine bleeding  an unusual or allergic reaction to black cohosh, soybeans, tartrazine dye (yellow dye number 5), other medicines, foods,  dyes, or preservatives  pregnant or trying to get pregnant  breast-feeding How should I use this medicine? Take this herb by mouth with a glass of water. Follow the directions on the package labeling, or talk to your health care professional. Do not use for longer than 6 months without the advice of a health care professional. Do not use if you are pregnant or breast-feeding. Talk to your obstetrician-gynecologist or certified nurse-midwife. This herb is not for use in children under the age of 19 years. Overdosage: If you think you have taken too much of this medicine contact a poison control center or emergency room at once. NOTE: This medicine is only for you. Do not share this medicine with others. What if I miss a dose? If you miss a dose, take it as soon as you can. If it is almost time for your next dose, take only that dose. Do not take double or extra doses. What may interact with this medicine?  atorvastatin  cisplatin  fertility treatments This list may not describe all possible interactions. Give your health care provider a list of all the medicines, herbs, non-prescription drugs, or dietary supplements you use. Also tell them if you smoke, drink alcohol, or use illegal drugs. Some items may interact with your medicine. What should I watch for while using this medicine? Since this herb is derived from a  plant, allergic reactions are possible. Stop using this herb if you develop a rash. You may need to see your health care professional, or inform them that this occurred. Report any unusual side effects promptly. If you are taking this herb for menstrual or menopausal symptoms, visit your doctor or health care professional for regular checks on your progress. You should have a complete check-up every 6 months. You will need a regular breast and pelvic exam while on this therapy. Follow the advice of your doctor or health care professional. Women should inform their doctor if they  wish to become pregnant or think they might be pregnant. If you have any reason to think you are pregnant, stop taking this herb at once and contact your doctor or health care professional. Herbal or dietary supplements are not regulated like medicines. Rigid quality control standards are not required for dietary supplements. The purity and strength of these products can vary. The safety and effect of this dietary supplement for a certain disease or illness is not well known. This product is not intended to diagnose, treat, cure or prevent any disease. The Food and Drug Administration suggests the following to help consumers protect themselves:  Always read product labels and follow directions.  Natural does not mean a product is safe for humans to take.  Look for products that include USP after the ingredient name. This means that the manufacturer followed the standards of the U.S. Pharmacopoeia.  Supplements made or sold by a nationally known food or drug company are more likely to be made under tight controls. You can write to the company for more information about how the product was made. What side effects may I notice from receiving this medicine? Side effects that you should report to your doctor or health care professional as soon as possible:  allergic reactions like skin rash, itching or hives, swelling of the face, lips, or tongue  breathing problems  dizziness  palpitations  signs and symptoms of liver injury like dark yellow or brown urine; general ill feeling or flu-like symptoms; light-colored stools; loss of appetite; nausea; right upper belly pain; unusually weak or tired; yellowing of the eyes or skin  unusual vaginal bleeding Side effects that usually do not require medical attention (report to your doctor or health care professional if they continue or are bothersome):  breast tenderness  headache  nausea  upset stomach This list may not describe all possible  side effects. Call your doctor for medical advice about side effects. You may report side effects to FDA at 1-800-FDA-1088. Where should I keep my medicine? Keep out of the reach of children. Store at room temperature between 15 and 30 degrees C (59 and 86 degrees C). Throw away any unused herb after the expiration date. NOTE: This sheet is a summary. It may not cover all possible information. If you have questions about this medicine, talk to your doctor, pharmacist, or health care provider.  2021 Elsevier/Gold Standard (2016-02-05 14:35:09)
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
## 2020-09-24 LAB — CYTOLOGY - PAP
Adequacy: ABSENT
Comment: NEGATIVE
Diagnosis: NEGATIVE
High risk HPV: NEGATIVE

## 2020-09-26 ENCOUNTER — Other Ambulatory Visit: Payer: Self-pay | Admitting: Obstetrics & Gynecology

## 2020-09-26 DIAGNOSIS — R195 Other fecal abnormalities: Secondary | ICD-10-CM

## 2020-09-26 LAB — FECAL OCCULT BLOOD, IMMUNOCHEMICAL: Fecal Occult Bld: POSITIVE — AB

## 2020-09-26 LAB — SPECIMEN STATUS REPORT

## 2020-09-27 ENCOUNTER — Telehealth: Payer: Self-pay

## 2020-09-27 NOTE — Telephone Encounter (Signed)
GI contacted patient to schedule apt. She couldn't remember why Dr. Kenton Kingfisher wanted her to make the apt or if she is supposed to f/u w/GI Dr. [phone]

## 2020-09-27 NOTE — Telephone Encounter (Signed)
Spoke w/patient. Advised Dr. Kenton Kingfisher referred her for positive fecal occult blood stool sample. He reported to patient this could be positive for many reasons, and we will send for a GI referral to see what the next best step is to investigate.

## 2020-10-07 ENCOUNTER — Other Ambulatory Visit (HOSPITAL_COMMUNITY): Payer: Self-pay | Admitting: Student

## 2020-10-07 ENCOUNTER — Other Ambulatory Visit: Payer: Self-pay | Admitting: Student

## 2020-10-07 DIAGNOSIS — M4306 Spondylolysis, lumbar region: Secondary | ICD-10-CM

## 2020-10-07 DIAGNOSIS — M5416 Radiculopathy, lumbar region: Secondary | ICD-10-CM

## 2020-10-08 ENCOUNTER — Encounter: Payer: Self-pay | Admitting: Obstetrics and Gynecology

## 2020-10-09 ENCOUNTER — Ambulatory Visit
Admission: RE | Admit: 2020-10-09 | Discharge: 2020-10-09 | Disposition: A | Discharge status: Home / Self Care | Payer: BC Managed Care – PPO | Source: Ambulatory Visit | Attending: Obstetrics & Gynecology | Admitting: Obstetrics & Gynecology

## 2020-10-09 ENCOUNTER — Other Ambulatory Visit: Payer: Self-pay

## 2020-10-09 DIAGNOSIS — Z1231 Encounter for screening mammogram for malignant neoplasm of breast: Secondary | ICD-10-CM | POA: Diagnosis not present

## 2020-10-16 ENCOUNTER — Other Ambulatory Visit: Payer: Self-pay

## 2020-10-16 ENCOUNTER — Ambulatory Visit
Admission: RE | Admit: 2020-10-16 | Discharge: 2020-10-16 | Disposition: A | Discharge status: Home / Self Care | Payer: BC Managed Care – PPO | Source: Ambulatory Visit | Attending: Student | Admitting: Student

## 2020-10-16 DIAGNOSIS — M5416 Radiculopathy, lumbar region: Secondary | ICD-10-CM | POA: Insufficient documentation

## 2020-10-16 DIAGNOSIS — M4306 Spondylolysis, lumbar region: Secondary | ICD-10-CM | POA: Insufficient documentation

## 2020-10-18 ENCOUNTER — Other Ambulatory Visit: Payer: Self-pay

## 2020-10-18 ENCOUNTER — Ambulatory Visit (LOCAL_COMMUNITY_HEALTH_CENTER): Payer: Self-pay

## 2020-10-18 DIAGNOSIS — Z111 Encounter for screening for respiratory tuberculosis: Secondary | ICD-10-CM

## 2020-11-20 ENCOUNTER — Other Ambulatory Visit: Payer: Self-pay | Admitting: Obstetrics & Gynecology

## 2020-11-20 ENCOUNTER — Ambulatory Visit: Payer: BC Managed Care – PPO | Admitting: Gastroenterology

## 2020-11-20 ENCOUNTER — Other Ambulatory Visit: Payer: Self-pay

## 2020-11-20 ENCOUNTER — Telehealth: Payer: Self-pay

## 2020-11-20 ENCOUNTER — Encounter: Payer: Self-pay | Admitting: Gastroenterology

## 2020-11-20 VITALS — BP 116/82 | HR 98 | Temp 97.6°F | Ht 61.0 in | Wt 305.8 lb

## 2020-11-20 DIAGNOSIS — K5900 Constipation, unspecified: Secondary | ICD-10-CM | POA: Diagnosis not present

## 2020-11-20 DIAGNOSIS — R195 Other fecal abnormalities: Secondary | ICD-10-CM | POA: Diagnosis not present

## 2020-11-20 NOTE — Progress Notes (Signed)
[name] [address]  [place]  [place], [place] [address]  Main: [phone]  Fax: [phone]   Gastroenterology Consultation  Referring Provider:     Steele Sizer, MD Primary Care Physician:  Steele Sizer, MD Reason for Consultation:     Positive FIT test        HPI:    Chief Complaint  Patient presents with  . positive stool test    [name] is a 50 y.o. y/o female referred for consultation & management  by Dr. Steele Sizer, MD.  FIT Test was ordered by her Ob/Gyn as part of her preventative exam as she has been told.  This was positive. The patient denies abdominal or flank pain, anorexia, nausea or vomiting, dysphagia, change in bowel habits or black or bloody stools or weight loss.  Patient had a colonoscopy for screening in December 2020 and this noted a fair prep, with 8 subcentimeter polyps removed.  Patient also has family history of colon cancer.  Patient is to follow-up in 1 year for repeat colonoscopy with 2-day prep, but did not.  Past Medical History:  Diagnosis Date  . Asthma   . Difficulty breathing   . Family history of breast cancer    BRCA testing letter sent; 3/22 genetic testing letter sent  . Sinus complaint   . Stroke [facility])     Past Surgical History:  Procedure Laterality Date  . CESAREAN SECTION  1989  . COLONOSCOPY    . COLONOSCOPY N/A 07/11/2019   Procedure: COLONOSCOPY;  Surgeon: Virgel Manifold, MD;  Location: [facility] ENDOSCOPY;  Service: Endoscopy;  Laterality: N/A;  . FOOT SURGERY Left 2013  . HERNIA REPAIR      Prior to Admission medications   Medication Sig Start Date End Date Taking? Authorizing Provider  albuterol (VENTOLIN HFA) 108 (90 Base) MCG/ACT inhaler Inhale 2 puffs into the lungs as needed (for shortness of breath). 08/27/19  Yes Merlyn Lot, MD  ascorbic acid (VITAMIN C) 250 MG tablet Take 1 tablet (250 mg total) by mouth daily. 04/14/20  Yes Loletha Grayer, MD  aspirin 81  MG chewable tablet Chew 1 tablet (81 mg total) by mouth daily. 07/13/18  Yes Gladstone Lighter, MD  CINNAMON PO Take 1 tablet by mouth daily.   Yes [provider]  mometasone-formoterol (DULERA) 200-5 MCG/ACT AERO Inhale 2 puffs into the lungs 2 (two) times daily. 01/14/17  Yes Fleming, Herbon E, MD  montelukast (SINGULAIR) 10 MG tablet Take 1 tablet by mouth daily. 04/22/18 11/20/20 Yes Erby Pian, MD  Omega-3 Fatty Acids (FISH OIL) 1000 MG CAPS Take 1 capsule by mouth daily.   Yes [provider]  phentermine (ADIPEX-P) 37.5 MG tablet One tablet po in morning. 09/20/20  Yes Gae Dry, MD  predniSONE (DELTASONE) 10 MG tablet Day 1-2: Take 48m(5 pills) Day 3-4: Take 485m4) Day 5-6: 3035m) Day 7-8: 82m59m Day 9:10mg19m1/19/21  Yes RobinMerlyn Lot vitamin B-12 (CYANOCOBALAMIN) 1000 MCG tablet Take 1,000 mcg by mouth daily.   Yes [provider]  zinc sulfate 220 (50 Zn) MG capsule Take 1 capsule (220 mg total) by mouth daily. 04/14/20  Yes Wieting, Richard, MD  ondansetron (ZOFRAN) 4 MG tablet Take 1 tablet (4 mg total) by mouth every 8 (eight) hours as needed for nausea or vomiting. Patient not taking: Reported on 11/20/2020 04/15/20   WietiLoletha Grayer   Family History  Problem Relation Age of Onset  .  Diabetes Mother   . Uterine cancer Mother   . Colon cancer Mother   . Breast cancer Mother        30s/40s  . Cancer - Lung Father   . Colon cancer Father   . Diverticulitis Brother   . Hernia Brother        x2 surgeries  . Colon cancer Paternal Grandmother   . Lung cancer Paternal Grandmother      Social History   Tobacco Use  . Smoking status: Never Smoker  . Smokeless tobacco: Never Used  Vaping Use  . Vaping Use: Never used  Substance Use Topics  . Alcohol use: Yes    Comment: occasionally  . Drug use: No    Allergies as of 11/20/2020 - Review Complete 11/20/2020  Allergen Reaction Noted  . Advil [ibuprofen] Other (See  Comments) 06/23/2013  . Percocet [oxycodone-acetaminophen] Nausea And Vomiting and Other (See Comments) 08/15/2013  . Azithromycin Rash 09/22/2018    Review of Systems:    All systems reviewed and negative except where noted in HPI.   Physical Exam:  BP 116/82   Pulse 98   Temp 97.6 F (36.4 C) (Oral)   Ht 5' 1"  (1.549 m)   Wt (!) 305 lb 12.8 oz (138.7 kg)   BMI 57.78 kg/m  No LMP recorded. Patient is perimenopausal. Psych:  Alert and cooperative. Normal mood and affect. General:   Alert,  Well-developed, well-nourished, pleasant and cooperative in NAD Head:  Normocephalic and atraumatic. Eyes:  Sclera clear, no icterus.   Conjunctiva pink. Ears:  Normal auditory acuity. Nose:  No deformity, discharge, or lesions. Mouth:  No deformity or lesions,oropharynx pink & moist. Neck:  Supple; no masses or thyromegaly. Abdomen:  Normal bowel sounds.  No bruits.  Soft, non-tender and non-distended without masses, hepatosplenomegaly or hernias noted.  No guarding or rebound tenderness.    Msk:  Symmetrical without gross deformities. Good, equal movement & strength bilaterally. Pulses:  Normal pulses noted. Extremities:  No clubbing or edema.  No cyanosis. Neurologic:  Alert and oriented x3;  grossly normal neurologically. Skin:  Intact without significant lesions or rashes. No jaundice. Lymph Nodes:  No significant cervical adenopathy. Psych:  Alert and cooperative. Normal mood and affect.   Labs: CBC    Component Value Date/Time   WBC 12.7 (H) 06/28/2020 1118   RBC 4.98 06/28/2020 1118   HGB 14.4 06/28/2020 1118   HGB 13.5 09/11/2014 0356   HCT 44.4 06/28/2020 1118   HCT 40.7 09/11/2014 0356   PLT 409 (H) 06/28/2020 1118   PLT 328 09/11/2014 0356   MCV 89.2 06/28/2020 1118   MCV 88 09/11/2014 0356   MCH 28.9 06/28/2020 1118   MCHC 32.4 06/28/2020 1118   RDW 14.8 06/28/2020 1118   RDW 14.6 (H) 09/11/2014 0356   LYMPHSABS 3.8 06/28/2020 1118   LYMPHSABS 0.6 (L) 09/11/2014  0356   MONOABS 0.6 06/28/2020 1118   MONOABS 0.5 09/11/2014 0356   EOSABS 0.7 (H) 06/28/2020 1118   EOSABS 0.1 09/11/2014 0356   BASOSABS 0.1 06/28/2020 1118   BASOSABS 0.1 09/11/2014 0356   CMP     Component Value Date/Time   NA 139 06/28/2020 1302   NA 140 09/11/2014 0356   K 3.4 (L) 06/28/2020 1302   K 3.5 09/11/2014 0356   CL 101 06/28/2020 1302   CL 105 09/11/2014 0356   CO2 25 06/28/2020 1302   CO2 27 09/11/2014 0356   GLUCOSE 80 06/28/2020 1302  GLUCOSE 126 (H) 09/11/2014 0356   BUN 12 06/28/2020 1302   BUN 14 09/11/2014 0356   CREATININE 0.64 06/28/2020 1302   CREATININE 0.90 05/30/2018 1645   CALCIUM 8.6 (L) 06/28/2020 1302   CALCIUM 8.6 09/11/2014 0356   PROT 7.3 06/28/2020 1302   PROT 7.0 09/11/2014 0356   ALBUMIN 3.6 06/28/2020 1302   ALBUMIN 3.1 (L) 09/11/2014 0356   AST 17 06/28/2020 1302   AST 22 09/11/2014 0356   ALT 14 06/28/2020 1302   ALT 18 09/11/2014 0356   ALKPHOS 83 06/28/2020 1302   ALKPHOS 71 09/11/2014 0356   BILITOT 0.6 06/28/2020 1302   BILITOT 0.5 09/11/2014 0356   GFRNONAA >60 06/28/2020 1302   GFRNONAA 76 05/30/2018 1645   GFRAA >60 04/15/2020 0731   GFRAA 88 05/30/2018 1645    Imaging Studies: No results found.  Assessment and Plan:   Iracema Lanagan is a 50 y.o. y/o female has been referred for positive FIT test  We discussed the etiology of her positive fit test, and discussed that the possibility that this is due to a malignancy is low given that she has had a colonoscopy in December 2020 that did not show a malignancy  However, pt was recommended to have a repeat colonoscopy in 1 year, with an extended prep due to fair prep on her December 2020 procedure.  Therefore, she is due for repeat colonoscopy at this time  This would allow [place] to also confirm that the positive FIT test does not signify a colon malignancy and may have been false positive or positive due to possible hemorrhoids or other benign  etiology  Alternative options of conservative management were discussed in detail, including but not limited to  foregoing endoscopic procedures at this time and others.    I have discussed alternative options, risks & benefits,  which include, but are not limited to, bleeding, infection, perforation,respiratory complication & drug reaction.  The patient agrees with this plan & written consent will be obtained.    High fiber diet encouraged as pt reports intermittent constipation. If continues, discuss further treatment as necessary.    Dr [name]  Speech recognition software was used to dictate the above note.

## 2020-11-20 NOTE — Telephone Encounter (Signed)
Patient called and requested a referral to [name]. She was seen today at [facility] by Dr Bonna Gains who recommended that she have another colonoscopy.  Patient is saying because of her insurance she is needing to see a [facility] provider.   Harris, please place referral to [place]. I will follow up with pt once this is done.

## 2021-01-01 ENCOUNTER — Telehealth: Payer: Self-pay

## 2021-01-01 NOTE — Telephone Encounter (Signed)
Pt calling; wants to only talk c PH about her wt loss journey.  [phone] Courtesy call to pt to let her know [place] not in until tomorrow.  Pt really wants to talk to [facility] tomorrow.

## 2021-01-02 ENCOUNTER — Other Ambulatory Visit: Payer: Self-pay | Admitting: Obstetrics & Gynecology

## 2021-01-02 MED ORDER — METFORMIN HCL 1000 MG PO TABS: 1000.0000 mg | ORAL_TABLET | Freq: Two times a day (BID) | ORAL | 5 refills | Status: AC

## 2021-01-02 MED ORDER — PHENTERMINE HCL 37.5 MG PO TABS: ORAL_TABLET | ORAL | 1 refills | Status: AC

## 2021-01-08 ENCOUNTER — Telehealth: Payer: Self-pay

## 2021-01-08 NOTE — Telephone Encounter (Signed)
Pt states she's having h/a, stomach hurts and nausea can she break the metformin in half?

## 2021-01-09 NOTE — Telephone Encounter (Signed)
Pt aware.

## 2021-03-14 ENCOUNTER — Encounter: Payer: Self-pay | Admitting: *Deleted

## 2021-03-17 ENCOUNTER — Encounter: Payer: Self-pay | Admitting: Anesthesiology

## 2021-03-17 ENCOUNTER — Encounter
Admission: RE | Disposition: A | Discharge status: Home / Self Care | Payer: Self-pay | Source: Home / Self Care | Attending: Gastroenterology

## 2021-03-17 ENCOUNTER — Ambulatory Visit
Admission: RE | Admit: 2021-03-17 | Discharge: 2021-03-17 | Disposition: A | Discharge status: Home / Self Care | Payer: BC Managed Care – PPO | Attending: Gastroenterology | Admitting: Gastroenterology

## 2021-03-17 ENCOUNTER — Ambulatory Visit: Payer: BC Managed Care – PPO | Admitting: Anesthesiology

## 2021-03-17 DIAGNOSIS — K621 Rectal polyp: Secondary | ICD-10-CM | POA: Diagnosis not present

## 2021-03-17 DIAGNOSIS — K6289 Other specified diseases of anus and rectum: Secondary | ICD-10-CM | POA: Diagnosis not present

## 2021-03-17 DIAGNOSIS — Z7984 Long term (current) use of oral hypoglycemic drugs: Secondary | ICD-10-CM | POA: Insufficient documentation

## 2021-03-17 DIAGNOSIS — D124 Benign neoplasm of descending colon: Secondary | ICD-10-CM | POA: Diagnosis not present

## 2021-03-17 DIAGNOSIS — Z886 Allergy status to analgesic agent status: Secondary | ICD-10-CM | POA: Diagnosis not present

## 2021-03-17 DIAGNOSIS — Z885 Allergy status to narcotic agent status: Secondary | ICD-10-CM | POA: Insufficient documentation

## 2021-03-17 DIAGNOSIS — K5730 Diverticulosis of large intestine without perforation or abscess without bleeding: Secondary | ICD-10-CM | POA: Diagnosis not present

## 2021-03-17 DIAGNOSIS — Z9889 Other specified postprocedural states: Secondary | ICD-10-CM | POA: Insufficient documentation

## 2021-03-17 DIAGNOSIS — Z8673 Personal history of transient ischemic attack (TIA), and cerebral infarction without residual deficits: Secondary | ICD-10-CM | POA: Insufficient documentation

## 2021-03-17 DIAGNOSIS — Z6841 Body Mass Index (BMI) 40.0 and over, adult: Secondary | ICD-10-CM | POA: Insufficient documentation

## 2021-03-17 DIAGNOSIS — Z7952 Long term (current) use of systemic steroids: Secondary | ICD-10-CM | POA: Diagnosis not present

## 2021-03-17 DIAGNOSIS — R195 Other fecal abnormalities: Secondary | ICD-10-CM | POA: Diagnosis present

## 2021-03-17 DIAGNOSIS — Z79899 Other long term (current) drug therapy: Secondary | ICD-10-CM | POA: Insufficient documentation

## 2021-03-17 DIAGNOSIS — E119 Type 2 diabetes mellitus without complications: Secondary | ICD-10-CM | POA: Diagnosis not present

## 2021-03-17 DIAGNOSIS — Z7982 Long term (current) use of aspirin: Secondary | ICD-10-CM | POA: Insufficient documentation

## 2021-03-17 DIAGNOSIS — Z800 Family history of malignant neoplasm of digestive organs: Secondary | ICD-10-CM | POA: Insufficient documentation

## 2021-03-17 DIAGNOSIS — D123 Benign neoplasm of transverse colon: Secondary | ICD-10-CM | POA: Diagnosis not present

## 2021-03-17 DIAGNOSIS — Z881 Allergy status to other antibiotic agents status: Secondary | ICD-10-CM | POA: Diagnosis not present

## 2021-03-17 HISTORY — PX: COLONOSCOPY WITH PROPOFOL: SHX5780

## 2021-03-17 HISTORY — DX: Morbid (severe) obesity due to excess calories: E66.01

## 2021-03-17 SURGERY — COLONOSCOPY WITH PROPOFOL
Anesthesia: General

## 2021-03-17 MED ORDER — SODIUM CHLORIDE 0.9 % IV SOLN: INTRAVENOUS | Status: DC

## 2021-03-17 MED ORDER — PROPOFOL 500 MG/50ML IV EMUL
INTRAVENOUS | Status: AC
  Filled 2021-03-17: qty 50

## 2021-03-17 MED ORDER — FENTANYL CITRATE (PF) 100 MCG/2ML IJ SOLN
INTRAMUSCULAR | Status: DC | PRN
  Administered 2021-03-17 (×2): 50 ug via INTRAVENOUS

## 2021-03-17 MED ORDER — FENTANYL CITRATE (PF) 100 MCG/2ML IJ SOLN
INTRAMUSCULAR | Status: AC
  Filled 2021-03-17: qty 2

## 2021-03-17 MED ORDER — PROPOFOL 500 MG/50ML IV EMUL
INTRAVENOUS | Status: DC | PRN
  Administered 2021-03-17: 150 ug/kg/min via INTRAVENOUS

## 2021-03-17 MED ORDER — PROPOFOL 10 MG/ML IV BOLUS
INTRAVENOUS | Status: AC
  Filled 2021-03-17: qty 20

## 2021-03-17 NOTE — Anesthesia Preprocedure Evaluation (Signed)
Anesthesia Evaluation  Patient identified by MRN, date of birth, ID band Patient awake    Reviewed: Allergy & Precautions, H&P , NPO status , Patient's Chart, lab work & pertinent test results, reviewed documented beta blocker date and time   Airway Mallampati: III   Neck ROM: full    Dental  (+) Teeth Intact   Pulmonary asthma , pneumonia,    Pulmonary exam normal        Cardiovascular Exercise Tolerance: Poor negative cardio ROS Normal cardiovascular exam Rhythm:regular Rate:Normal     Neuro/Psych PSYCHIATRIC DISORDERS Depression CVA    GI/Hepatic negative GI ROS, Neg liver ROS,   Endo/Other  Morbid obesity  Renal/GU negative Renal ROS  negative genitourinary   Musculoskeletal   Abdominal   Peds  Hematology negative hematology ROS (+)   Anesthesia Other Findings Past Medical History: No date: Asthma No date: Difficulty breathing No date: Family history of breast cancer     Comment:  BRCA testing letter sent; 3/22 genetic testing letter               sent No date: Morbid obesity with BMI of 50.0-59.9, adult ([place]) No date: Sinus complaint No date: Stroke [facility]) Past Surgical History: 1989: CESAREAN SECTION No date: COLONOSCOPY 07/11/2019: COLONOSCOPY; N/A     Comment:  Procedure: COLONOSCOPY;  Surgeon: Virgel Manifold,               MD;  Location: [facility] ENDOSCOPY;  Service: Endoscopy;                Laterality: N/A; 2013: FOOT SURGERY; Left No date: HERNIA REPAIR BMI    Body Mass Index: 55.74 kg/m     Reproductive/Obstetrics negative OB ROS                             Anesthesia Physical Anesthesia Plan  ASA: 3  Anesthesia Plan: General   Post-op Pain Management:    Induction:   PONV Risk Score and Plan:   Airway Management Planned:   Additional Equipment:   Intra-op Plan:   Post-operative Plan:   Informed Consent: I have reviewed the patients History and  Physical, chart, labs and discussed the procedure including the risks, benefits and alternatives for the proposed anesthesia with the patient or authorized representative who has indicated his/her understanding and acceptance.     Dental Advisory Given  Plan Discussed with: CRNA  Anesthesia Plan Comments:         Anesthesia Quick Evaluation

## 2021-03-17 NOTE — H&P (Signed)
Outpatient short stay form Pre-procedure 03/17/2021 1:13 PM Raylene Miyamoto MD, MPH  Primary Physician: [facility]  Reason for visit:  Positive FIT testing  History of present illness:   50 y/o lady with history of obesity, DM II, and polyps here for colonoscopy for positive FIT testing. Had colonoscopy in 2020 with 8 Ta's. Father had colon cancer in his 43's. Hx of c-section and hernia repair. No blood thinners.    Current Facility-Administered Medications:    0.9 %  sodium chloride infusion, , Intravenous, Continuous, Kimberly Coye, Hilton Cork, MD, Last Rate: 20 mL/hr at 03/17/21 1308, Continued from Pre-op at 03/17/21 1308  Medications Prior to Admission  Medication Sig Dispense Refill Last Dose   albuterol (VENTOLIN HFA) 108 (90 Base) MCG/ACT inhaler Inhale 2 puffs into the lungs as needed (for shortness of breath). 8 g 1 03/17/2021   mometasone-formoterol (DULERA) 200-5 MCG/ACT AERO Inhale 2 puffs into the lungs 2 (two) times daily.   03/17/2021   predniSONE (DELTASONE) 10 MG tablet Day 1-2: Take 25m(5 pills) Day 3-4: Take 454m4) Day 5-6: 3059m) Day 7-8: 25m77m Day 9:10mg57m9 tablet 0 03/17/2021   ascorbic acid (VITAMIN C) 250 MG tablet Take 1 tablet (250 mg total) by mouth daily. 30 tablet 0    aspirin 81 MG chewable tablet Chew 1 tablet (81 mg total) by mouth daily. 30 tablet 0    CINNAMON PO Take 1 tablet by mouth daily.      metFORMIN (GLUCOPHAGE) 1000 MG tablet Take 1 tablet (1,000 mg total) by mouth 2 (two) times daily with a meal. 60 tablet 5    montelukast (SINGULAIR) 10 MG tablet Take 1 tablet by mouth daily.      Omega-3 Fatty Acids (FISH OIL) 1000 MG CAPS Take 1 capsule by mouth daily.      ondansetron (ZOFRAN) 4 MG tablet Take 1 tablet (4 mg total) by mouth every 8 (eight) hours as needed for nausea or vomiting. (Patient not taking: Reported on 11/20/2020) 20 tablet 0    phentermine (ADIPEX-P) 37.5 MG tablet One tablet po in morning. 30 tablet 1    vitamin B-12 (CYANOCOBALAMIN)  1000 MCG tablet Take 1,000 mcg by mouth daily.      zinc sulfate 220 (50 Zn) MG capsule Take 1 capsule (220 mg total) by mouth daily. 30 capsule 0      Allergies  Allergen Reactions   Advil [Ibuprofen] Other (See Comments)    Other Reaction: Other reaction-swells   Percocet [Oxycodone-Acetaminophen] Nausea And Vomiting and Other (See Comments)    headaches   Azithromycin Rash    PATIENT UNSURE IF RASH WAS ACTUALLY DUE TO ABX      Past Medical History:  Diagnosis Date   Asthma    Difficulty breathing    Family history of breast cancer    BRCA testing letter sent; 3/22 genetic testing letter sent   Morbid [facility] with BMI [facility] 50.0-59.9, [facility]    [facility] Review of systems:  Otherwise negative.    Physical Exam  Gen: Alert, oriented. Appears stated age.  HEENT: PERRLA. Lungs: No respiratory distress CV: RRR Abd: soft, benign, no masses Ext: No edema    Planned procedures: Proceed with colonoscopy. The patient understands the nature of the planned procedure, indications, risks, alternatives and potential complications including but not limited to bleeding, infection, perforation, damage to internal organs and possible oversedation/side effects from anesthesia. The patient agrees and gives consent to  proceed.  Please refer to procedure notes for findings, recommendations and patient disposition/instructions.     Raylene Miyamoto MD, MPH Gastroenterology 03/17/2021  1:13 PM

## 2021-03-17 NOTE — Interval H&P Note (Signed)
History and Physical Interval Note:  03/17/2021 1:15 PM  [name]  has presented today for surgery, with the diagnosis of POSITIVE BLOOD TEST.  The various methods of treatment have been discussed with the patient and family. After consideration of risks, benefits and other options for treatment, the patient has consented to  Procedure(s): COLONOSCOPY WITH PROPOFOL (N/A) as a surgical intervention.  The patient's history has been reviewed, patient examined, no change in status, stable for surgery.  I have reviewed the patient's chart and labs.  Questions were answered to the patient's satisfaction.     Lesly Rubenstein  Ok to proceed with colonoscopy

## 2021-03-17 NOTE — Anesthesia Postprocedure Evaluation (Signed)
Anesthesia Post Note  Patient: [name]  Procedure(s) Performed: COLONOSCOPY WITH PROPOFOL  Patient location during evaluation: PACU Anesthesia Type: General Level of consciousness: awake and alert Pain management: pain level controlled Vital Signs Assessment: post-procedure vital signs reviewed and stable Respiratory status: spontaneous breathing, nonlabored ventilation, respiratory function stable and patient connected to nasal cannula oxygen Cardiovascular status: blood pressure returned to baseline and stable Postop Assessment: no apparent nausea or vomiting Anesthetic complications: no   No notable events documented.   Last Vitals:  Vitals:   03/17/21 1349 03/17/21 1359  BP: (!) 117/95 124/75  Pulse:    Resp: (!) 26 17  Temp:    SpO2: 100% 100%    Last Pain:  Vitals:   03/17/21 1349  TempSrc:   PainSc: 3                  Molli Barrows

## 2021-03-17 NOTE — Anesthesia Procedure Notes (Signed)
Date/Time: 03/17/2021 1:26 PM Performed by: Vaughan Sine Pre-anesthesia Checklist: Patient identified, Emergency Drugs available, Suction available, Patient being monitored and Timeout performed Patient Re-evaluated:Patient Re-evaluated prior to induction Oxygen Delivery Method: Simple face mask Preoxygenation: Pre-oxygenation with 100% oxygen Induction Type: IV induction Placement Confirmation: CO2 detector and positive ETCO2

## 2021-03-17 NOTE — Op Note (Signed)
[facility] Gastroenterology Patient Name: [name] Procedure Date: 03/17/2021 1:03 PM MRN: [id number] Account #: [id number] Date of Birth: [birth year] Admit Type: Outpatient Age: 50 Room: [facility] ENDO ROOM 3 Gender: Female Note Status: Finalized Procedure:             Colonoscopy Indications:           Positive Heme-occult Providers:             Andrey Farmer MD, MD Medicines:             Monitored Anesthesia Care Complications:         No immediate complications. Estimated blood loss:                         Minimal. Procedure:             Pre-Anesthesia Assessment:                        - Prior to the procedure, a History and Physical was                         performed, and patient medications and allergies were                         reviewed. The patient is competent. The risks and                         benefits of the procedure and the sedation options and                         risks were discussed with the patient. All questions                         were answered and informed consent was obtained.                         Patient identification and proposed procedure were                         verified by the physician, the nurse, the anesthetist                         and the technician in the endoscopy suite. Mental                         Status Examination: alert and oriented. Airway                         Examination: normal oropharyngeal airway and neck                         mobility. Respiratory Examination: clear to                         auscultation. CV Examination: normal. Prophylactic                         Antibiotics: The patient does not require prophylactic  antibiotics. Prior Anticoagulants: The patient has                         taken no previous anticoagulant or antiplatelet                         agents. ASA Grade Assessment: III - A patient with                         severe systemic  disease. After reviewing the risks and                         benefits, the patient was deemed in satisfactory                         condition to undergo the procedure. The anesthesia                         plan was to use monitored anesthesia care (MAC).                         Immediately prior to administration of medications,                         the patient was re-assessed for adequacy to receive                         sedatives. The heart rate, respiratory rate, oxygen                         saturations, blood pressure, adequacy of pulmonary                         ventilation, and response to care were monitored                         throughout the procedure. The physical status of the                         patient was re-assessed after the procedure.                        After obtaining informed consent, the colonoscope was                         passed under direct vision. Throughout the procedure,                         the patient's blood pressure, pulse, and oxygen                         saturations were monitored continuously. The                         Colonoscope was introduced through the anus and                         advanced to the the cecum, identified by appendiceal  orifice and ileocecal valve. The colonoscopy was                         performed without difficulty. The patient tolerated                         the procedure well. The quality of the bowel                         preparation was good except the ascending colon was                         fair. Findings:      The perianal and digital rectal examinations were normal.      A 3 mm polyp was found in the transverse colon. The polyp was sessile.       The polyp was removed with a cold snare. Resection and retrieval were       complete. Estimated blood loss was minimal.      A 1 mm polyp was found in the descending colon. The polyp was sessile.       The polyp was  removed with a jumbo cold forceps. Resection and retrieval       were complete. Estimated blood loss was minimal.      Anal papilla(e) were hypertrophied. Biopsies were taken with a cold       forceps for histology. Estimated blood loss was minimal.      Multiple small-mouthed diverticula were found in the sigmoid colon,       descending colon, transverse colon and ascending colon.      The exam was otherwise without abnormality on direct and retroflexion       views. Impression:            - One 3 mm polyp in the transverse colon, removed with                         a cold snare. Resected and retrieved.                        - One 1 mm polyp in the descending colon, removed with                         a jumbo cold forceps. Resected and retrieved.                        - Anal papilla(e) were hypertrophied. Biopsied.                        - Diverticulosis in the sigmoid colon, in the                         descending colon, in the transverse colon and in the                         ascending colon.                        - The examination was otherwise normal on direct and  retroflexion views. Recommendation:        - Discharge patient to home.                        - Resume previous diet.                        - Continue present medications.                        - Await pathology results.                        - Repeat colonoscopy for surveillance based on                         pathology results.                        - Return to referring physician as previously                         scheduled. Procedure Code(s):     --- Professional ---                        [id number], Colonoscopy, flexible; with removal of                         tumor(s), polyp(s), or other lesion(s) by snare                         technique                        45380, 97, Colonoscopy, flexible; with biopsy, single                         or multiple Diagnosis Code(s):      --- Professional ---                        K63.5, Polyp of colon                        K62.89, Other specified diseases of anus and rectum                        K57.30, Diverticulosis of large intestine without                         perforation or abscess without bleeding CPT copyright 2019 [employer]. All rights reserved. The codes documented in this report are preliminary and upon coder review may  be revised to meet current compliance requirements. Andrey Farmer MD, MD 03/17/2021 1:43:17 PM Number of Addenda: 0 Note Initiated On: 03/17/2021 1:03 PM Scope Withdrawal Time: 0 hours 10 minutes 38 seconds  Total Procedure Duration: 0 hours 16 minutes 28 seconds  Estimated Blood Loss:  Estimated blood loss was minimal.      [facility]

## 2021-03-17 NOTE — Transfer of Care (Signed)
Immediate Anesthesia Transfer of Care Note  Patient: [name]  Procedure(s) Performed: COLONOSCOPY WITH PROPOFOL  Patient Location: PACU  Anesthesia Type:General  Level of Consciousness: awake and sedated  Airway & Oxygen Therapy: Patient Spontanous Breathing and Patient connected to nasal cannula oxygen  Post-op Assessment: Report given to RN and Post -op Vital signs reviewed and stable  Post vital signs: Reviewed and stable  Last Vitals:  Vitals Value Taken Time  BP    Temp    Pulse    Resp    SpO2      Last Pain:  Vitals:   03/17/21 1206  TempSrc: Temporal  PainSc: 0-No pain         Complications: No notable events documented.

## 2021-03-19 LAB — SURGICAL PATHOLOGY

## 2021-06-27 ENCOUNTER — Other Ambulatory Visit: Payer: Self-pay | Admitting: Obstetrics & Gynecology

## 2021-06-27 ENCOUNTER — Telehealth: Payer: Self-pay

## 2021-06-27 MED ORDER — FLUCONAZOLE 150 MG PO TABS: 150.0000 mg | ORAL_TABLET | Freq: Once | ORAL | 3 refills | Status: AC

## 2021-06-27 NOTE — Telephone Encounter (Signed)
Pt calling; for rx for 2-3 days for a yeast inf.  [phone]  Pt states has itching and d/c without color or odor; monistat not working.  Pharm correct in chart.

## 2021-06-27 NOTE — Telephone Encounter (Signed)
Done

## 2021-06-27 NOTE — Telephone Encounter (Signed)
Pt aware.

## 2021-07-31 ENCOUNTER — Other Ambulatory Visit (HOSPITAL_COMMUNITY): Payer: Self-pay | Admitting: Internal Medicine

## 2021-07-31 ENCOUNTER — Other Ambulatory Visit: Payer: Self-pay | Admitting: Internal Medicine

## 2021-07-31 DIAGNOSIS — R0609 Other forms of dyspnea: Secondary | ICD-10-CM

## 2021-07-31 DIAGNOSIS — R9389 Abnormal findings on diagnostic imaging of other specified body structures: Secondary | ICD-10-CM

## 2021-08-12 ENCOUNTER — Encounter: Payer: Self-pay | Admitting: Radiology

## 2021-08-12 ENCOUNTER — Emergency Department: Payer: BC Managed Care – PPO | Admitting: Anesthesiology

## 2021-08-12 ENCOUNTER — Ambulatory Visit
Admission: EM | Admit: 2021-08-12 | Discharge: 2021-08-12 | Disposition: A | Discharge status: Home / Self Care | Payer: BC Managed Care – PPO | Attending: Emergency Medicine | Admitting: Emergency Medicine

## 2021-08-12 ENCOUNTER — Emergency Department: Payer: BC Managed Care – PPO

## 2021-08-12 ENCOUNTER — Encounter
Admission: EM | Disposition: A | Discharge status: Home / Self Care | Payer: Self-pay | Source: Home / Self Care | Attending: Emergency Medicine

## 2021-08-12 ENCOUNTER — Other Ambulatory Visit: Payer: Self-pay

## 2021-08-12 DIAGNOSIS — Z20822 Contact with and (suspected) exposure to covid-19: Secondary | ICD-10-CM | POA: Insufficient documentation

## 2021-08-12 DIAGNOSIS — J45909 Unspecified asthma, uncomplicated: Secondary | ICD-10-CM | POA: Insufficient documentation

## 2021-08-12 DIAGNOSIS — Z6841 Body Mass Index (BMI) 40.0 and over, adult: Secondary | ICD-10-CM | POA: Diagnosis not present

## 2021-08-12 DIAGNOSIS — K430 Incisional hernia with obstruction, without gangrene: Secondary | ICD-10-CM | POA: Diagnosis not present

## 2021-08-12 DIAGNOSIS — K429 Umbilical hernia without obstruction or gangrene: Secondary | ICD-10-CM | POA: Insufficient documentation

## 2021-08-12 DIAGNOSIS — K436 Other and unspecified ventral hernia with obstruction, without gangrene: Secondary | ICD-10-CM | POA: Diagnosis not present

## 2021-08-12 DIAGNOSIS — K460 Unspecified abdominal hernia with obstruction, without gangrene: Secondary | ICD-10-CM | POA: Diagnosis present

## 2021-08-12 HISTORY — PX: VENTRAL HERNIA REPAIR: SHX424

## 2021-08-12 LAB — URINALYSIS, ROUTINE W REFLEX MICROSCOPIC
Bacteria, UA: NONE SEEN
Bilirubin Urine: NEGATIVE
Glucose, UA: NEGATIVE mg/dL
Hgb urine dipstick: NEGATIVE
Ketones, ur: NEGATIVE mg/dL
Nitrite: NEGATIVE
Protein, ur: NEGATIVE mg/dL
Specific Gravity, Urine: 1.032 — ABNORMAL HIGH (ref 1.005–1.030)
pH: 8 (ref 5.0–8.0)

## 2021-08-12 LAB — COMPREHENSIVE METABOLIC PANEL
ALT: 20 U/L (ref 0–44)
AST: 21 U/L (ref 15–41)
Albumin: 3.6 g/dL (ref 3.5–5.0)
Alkaline Phosphatase: 85 U/L (ref 38–126)
Anion gap: 6 (ref 5–15)
BUN: 9 mg/dL (ref 6–20)
CO2: 27 mmol/L (ref 22–32)
Calcium: 8.8 mg/dL — ABNORMAL LOW (ref 8.9–10.3)
Chloride: 102 mmol/L (ref 98–111)
Creatinine, Ser: 0.64 mg/dL (ref 0.44–1.00)
GFR, Estimated: >60 mL/min (ref >60)
Glucose, Bld: 84 mg/dL (ref 70–99)
Potassium: 4.1 mmol/L (ref 3.5–5.1)
Sodium: 135 mmol/L (ref 135–145)
Total Bilirubin: 0.7 mg/dL (ref 0.3–1.2)
Total Protein: 7.2 g/dL (ref 6.5–8.1)

## 2021-08-12 LAB — CBC
HCT: 40.5 % (ref 36.0–46.0)
Hemoglobin: 13.1 g/dL (ref 12.0–15.0)
MCH: 27.9 pg (ref 26.0–34.0)
MCHC: 32.3 g/dL (ref 30.0–36.0)
MCV: 86.4 fL (ref 80.0–100.0)
Platelets: 481 10*3/uL — ABNORMAL HIGH (ref 150–400)
RBC: 4.69 MIL/uL (ref 3.87–5.11)
RDW: 15 % (ref 11.5–15.5)
WBC: 14.8 10*3/uL — ABNORMAL HIGH (ref 4.0–10.5)
nRBC: 0 % (ref 0.0–0.2)

## 2021-08-12 LAB — RESP PANEL BY RT-PCR (FLU A&B, COVID) ARPGX2
Influenza A by PCR: NEGATIVE
Influenza B by PCR: NEGATIVE
SARS Coronavirus 2 by RT PCR: NEGATIVE

## 2021-08-12 LAB — POC URINE PREG, ED: Preg Test, Ur: NEGATIVE

## 2021-08-12 LAB — LACTIC ACID, PLASMA: Lactic Acid, Venous: 1.2 mmol/L (ref 0.5–1.9)

## 2021-08-12 LAB — LIPASE, BLOOD: Lipase: 25 U/L (ref 11–51)

## 2021-08-12 SURGERY — REPAIR, HERNIA, VENTRAL
Anesthesia: General | Site: Abdomen

## 2021-08-12 MED ORDER — ALBUTEROL SULFATE HFA 108 (90 BASE) MCG/ACT IN AERS
INHALATION_SPRAY | RESPIRATORY_TRACT | Status: DC | PRN
Start: 2021-08-12 — End: 2021-08-12
  Administered 2021-08-12: 6 via RESPIRATORY_TRACT

## 2021-08-12 MED ORDER — ONDANSETRON HCL 4 MG/2ML IJ SOLN
4.0000 mg | Freq: Once | INTRAMUSCULAR | Status: AC | PRN
  Administered 2021-08-12: 4 mg via INTRAVENOUS

## 2021-08-12 MED ORDER — FENTANYL CITRATE (PF) 100 MCG/2ML IJ SOLN
INTRAMUSCULAR | Status: AC
  Administered 2021-08-12: 25 ug via INTRAVENOUS
  Filled 2021-08-12: qty 2

## 2021-08-12 MED ORDER — CHLORHEXIDINE GLUCONATE 0.12 % MT SOLN: 15.0000 mL | Freq: Once | OROMUCOSAL | Status: DC

## 2021-08-12 MED ORDER — ACETAMINOPHEN 10 MG/ML IV SOLN: 1000.0000 mg | Freq: Once | INTRAVENOUS | Status: DC | PRN

## 2021-08-12 MED ORDER — PHENYLEPHRINE HCL (PRESSORS) 10 MG/ML IV SOLN
INTRAVENOUS | Status: DC | PRN
  Administered 2021-08-12 (×2): 200 ug via INTRAVENOUS
  Administered 2021-08-12: 100 ug via INTRAVENOUS
  Administered 2021-08-12: 80 ug via INTRAVENOUS
  Administered 2021-08-12: 200 ug via INTRAVENOUS
  Administered 2021-08-12: 160 ug via INTRAVENOUS

## 2021-08-12 MED ORDER — BUPIVACAINE-EPINEPHRINE (PF) 0.25% -1:200000 IJ SOLN
INTRAMUSCULAR | Status: AC
  Filled 2021-08-12: qty 30

## 2021-08-12 MED ORDER — BUPIVACAINE LIPOSOME 1.3 % IJ SUSP
INTRAMUSCULAR | Status: AC
  Filled 2021-08-12: qty 20

## 2021-08-12 MED ORDER — PROPOFOL 10 MG/ML IV BOLUS
INTRAVENOUS | Status: DC | PRN
  Administered 2021-08-12: 200 mg via INTRAVENOUS

## 2021-08-12 MED ORDER — OXYCODONE HCL 5 MG PO TABS
ORAL_TABLET | ORAL | Status: AC
  Filled 2021-08-12: qty 1

## 2021-08-12 MED ORDER — FENTANYL CITRATE (PF) 100 MCG/2ML IJ SOLN
INTRAMUSCULAR | Status: AC
  Filled 2021-08-12: qty 2

## 2021-08-12 MED ORDER — ORAL CARE MOUTH RINSE
15.0000 mL | Freq: Once | OROMUCOSAL | Status: DC
  Filled 2021-08-12: qty 15

## 2021-08-12 MED ORDER — ALBUTEROL SULFATE HFA 108 (90 BASE) MCG/ACT IN AERS
INHALATION_SPRAY | RESPIRATORY_TRACT | Status: AC
  Filled 2021-08-12: qty 6.7

## 2021-08-12 MED ORDER — SUGAMMADEX SODIUM 500 MG/5ML IV SOLN
INTRAVENOUS | Status: DC | PRN
  Administered 2021-08-12: 500 mg via INTRAVENOUS

## 2021-08-12 MED ORDER — OXYCODONE-ACETAMINOPHEN 5-325 MG PO TABS: 1.0000 | ORAL_TABLET | ORAL | 0 refills | Status: DC | PRN

## 2021-08-12 MED ORDER — KETOROLAC TROMETHAMINE 30 MG/ML IJ SOLN
15.0000 mg | Freq: Once | INTRAMUSCULAR | Status: AC
  Administered 2021-08-12: 15 mg via INTRAVENOUS
  Filled 2021-08-12: qty 1

## 2021-08-12 MED ORDER — HYDROMORPHONE HCL 1 MG/ML IJ SOLN
1.0000 mg | Freq: Once | INTRAMUSCULAR | Status: AC
  Administered 2021-08-12: 1 mg via INTRAVENOUS
  Filled 2021-08-12: qty 1

## 2021-08-12 MED ORDER — LACTATED RINGERS IV SOLN: INTRAVENOUS | Status: DC

## 2021-08-12 MED ORDER — OXYCODONE HCL 5 MG PO TABS
5.0000 mg | ORAL_TABLET | Freq: Once | ORAL | Status: AC | PRN
  Administered 2021-08-12: 5 mg via ORAL

## 2021-08-12 MED ORDER — FENTANYL CITRATE (PF) 100 MCG/2ML IJ SOLN
25.0000 ug | INTRAMUSCULAR | Status: DC | PRN
  Administered 2021-08-12: 25 ug via INTRAVENOUS

## 2021-08-12 MED ORDER — SUGAMMADEX SODIUM 500 MG/5ML IV SOLN
INTRAVENOUS | Status: AC
  Filled 2021-08-12: qty 5

## 2021-08-12 MED ORDER — LACTATED RINGERS IV BOLUS
1000.0000 mL | Freq: Once | INTRAVENOUS | Status: AC
Start: 2021-08-12 — End: 2021-08-12
  Administered 2021-08-12: 1000 mL via INTRAVENOUS

## 2021-08-12 MED ORDER — SODIUM CHLORIDE (PF) 0.9 % IJ SOLN
INTRAMUSCULAR | Status: AC
  Filled 2021-08-12: qty 50

## 2021-08-12 MED ORDER — ACETAMINOPHEN 10 MG/ML IV SOLN
INTRAVENOUS | Status: AC
  Filled 2021-08-12: qty 100

## 2021-08-12 MED ORDER — SUCCINYLCHOLINE CHLORIDE 200 MG/10ML IV SOSY
PREFILLED_SYRINGE | INTRAVENOUS | Status: DC | PRN
  Administered 2021-08-12: 120 mg via INTRAVENOUS

## 2021-08-12 MED ORDER — ALBUTEROL SULFATE (2.5 MG/3ML) 0.083% IN NEBU
INHALATION_SOLUTION | RESPIRATORY_TRACT | Status: AC
  Filled 2021-08-12: qty 3

## 2021-08-12 MED ORDER — SODIUM CHLORIDE (PF) 0.9 % IJ SOLN
INTRAMUSCULAR | Status: DC | PRN
  Administered 2021-08-12: 100 mL via SURGICAL_CAVITY

## 2021-08-12 MED ORDER — EPHEDRINE SULFATE 50 MG/ML IJ SOLN
INTRAMUSCULAR | Status: DC | PRN
  Administered 2021-08-12 (×4): 5 mg via INTRAVENOUS

## 2021-08-12 MED ORDER — DEXMEDETOMIDINE (PRECEDEX) IN NS 20 MCG/5ML (4 MCG/ML) IV SYRINGE
PREFILLED_SYRINGE | INTRAVENOUS | Status: DC | PRN
  Administered 2021-08-12: 8 ug via INTRAVENOUS

## 2021-08-12 MED ORDER — LIDOCAINE HCL (CARDIAC) PF 100 MG/5ML IV SOSY
PREFILLED_SYRINGE | INTRAVENOUS | Status: DC | PRN
  Administered 2021-08-12: 100 mg via INTRAVENOUS

## 2021-08-12 MED ORDER — SODIUM CHLORIDE 0.9 % IV SOLN
2.0000 g | Freq: Two times a day (BID) | INTRAVENOUS | Status: DC
  Administered 2021-08-12: 2 g via INTRAVENOUS
  Filled 2021-08-12 (×3): qty 2

## 2021-08-12 MED ORDER — ONDANSETRON HCL 4 MG/2ML IJ SOLN
INTRAMUSCULAR | Status: AC
  Filled 2021-08-12: qty 2

## 2021-08-12 MED ORDER — LIDOCAINE HCL (PF) 2 % IJ SOLN
INTRAMUSCULAR | Status: AC
  Filled 2021-08-12: qty 5

## 2021-08-12 MED ORDER — MIDAZOLAM HCL 2 MG/2ML IJ SOLN
INTRAMUSCULAR | Status: DC | PRN
  Administered 2021-08-12: 2 mg via INTRAVENOUS

## 2021-08-12 MED ORDER — ACETAMINOPHEN 10 MG/ML IV SOLN
INTRAVENOUS | Status: DC | PRN
  Administered 2021-08-12: 1000 mg via INTRAVENOUS

## 2021-08-12 MED ORDER — IOHEXOL 300 MG/ML  SOLN
100.0000 mL | Freq: Once | INTRAMUSCULAR | Status: AC | PRN
  Administered 2021-08-12: 100 mL via INTRAVENOUS

## 2021-08-12 MED ORDER — ONDANSETRON HCL 4 MG/2ML IJ SOLN
INTRAMUSCULAR | Status: DC | PRN
  Administered 2021-08-12: 4 mg via INTRAVENOUS

## 2021-08-12 MED ORDER — PROPOFOL 10 MG/ML IV BOLUS
INTRAVENOUS | Status: AC
  Filled 2021-08-12: qty 20

## 2021-08-12 MED ORDER — FENTANYL CITRATE (PF) 100 MCG/2ML IJ SOLN
INTRAMUSCULAR | Status: DC | PRN
  Administered 2021-08-12: 50 ug via INTRAVENOUS

## 2021-08-12 MED ORDER — LACTATED RINGERS IV SOLN: INTRAVENOUS | Status: DC | PRN

## 2021-08-12 MED ORDER — MIDAZOLAM HCL 2 MG/2ML IJ SOLN
INTRAMUSCULAR | Status: AC
  Filled 2021-08-12: qty 2

## 2021-08-12 MED ORDER — ONDANSETRON HCL 4 MG/2ML IJ SOLN
4.0000 mg | Freq: Once | INTRAMUSCULAR | Status: AC
  Administered 2021-08-12: 4 mg via INTRAVENOUS
  Filled 2021-08-12: qty 2

## 2021-08-12 MED ORDER — OXYCODONE HCL 5 MG/5ML PO SOLN: 5.0000 mg | Freq: Once | ORAL | Status: AC | PRN

## 2021-08-12 MED ORDER — ROCURONIUM BROMIDE 100 MG/10ML IV SOLN
INTRAVENOUS | Status: DC | PRN
  Administered 2021-08-12: 40 mg via INTRAVENOUS

## 2021-08-12 MED ORDER — DEXAMETHASONE SODIUM PHOSPHATE 10 MG/ML IJ SOLN
INTRAMUSCULAR | Status: DC | PRN
  Administered 2021-08-12: 10 mg via INTRAVENOUS

## 2021-08-12 SURGICAL SUPPLY — 37 items
APPLIER CLIP 11 MED OPEN (CLIP) ×2
APPLIER CLIP 13 LRG OPEN (CLIP) ×2
BLADE CLIPPER SURG (BLADE) ×2 IMPLANT
CHLORAPREP W/TINT 26 (MISCELLANEOUS) ×2 IMPLANT
CLIP APPLIE 11 MED OPEN (CLIP) ×1 IMPLANT
CLIP APPLIE 13 LRG OPEN (CLIP) ×1 IMPLANT
DRAPE LAPAROTOMY 100X77 ABD (DRAPES) ×2 IMPLANT
DRSG OPSITE POSTOP 4X8 (GAUZE/BANDAGES/DRESSINGS) ×1 IMPLANT
DRSG TELFA 3X8 NADH (GAUZE/BANDAGES/DRESSINGS) ×2 IMPLANT
ELECT CAUTERY BLADE 6.4 (BLADE) ×2 IMPLANT
ELECT REM PT RETURN 9FT ADLT (ELECTROSURGICAL) ×2
ELECTRODE REM PT RTRN 9FT ADLT (ELECTROSURGICAL) ×1 IMPLANT
GAUZE 4X4 16PLY ~~LOC~~+RFID DBL (SPONGE) ×2 IMPLANT
GAUZE SPONGE 4X4 12PLY STRL (GAUZE/BANDAGES/DRESSINGS) ×2 IMPLANT
GLOVE SURG ENC MOIS LTX SZ7 (GLOVE) ×2 IMPLANT
GOWN STRL REUS W/ TWL LRG LVL3 (GOWN DISPOSABLE) ×2 IMPLANT
GOWN STRL REUS W/TWL LRG LVL3 (GOWN DISPOSABLE) ×2
MANIFOLD NEPTUNE II (INSTRUMENTS) ×2 IMPLANT
MESH VENTRALEX ST 1-7/10 CRC S (Mesh General) ×1 IMPLANT
MESH VENTRALEX ST 2.5 CRC MED (Mesh General) ×1 IMPLANT
NDL HYPO 25X1 1.5 SAFETY (NEEDLE) ×1 IMPLANT
NEEDLE HYPO 22GX1.5 SAFETY (NEEDLE) ×2 IMPLANT
NEEDLE HYPO 25X1 1.5 SAFETY (NEEDLE) ×2 IMPLANT
PACK BASIN MINOR ARMC (MISCELLANEOUS) ×2 IMPLANT
PAD DRESSING TELFA 3X8 NADH (GAUZE/BANDAGES/DRESSINGS) ×1 IMPLANT
SPONGE T-LAP 18X18 ~~LOC~~+RFID (SPONGE) ×2 IMPLANT
STAPLER SKIN PROX 35W (STAPLE) ×2 IMPLANT
SUT ETHIBOND 0 MO6 C/R (SUTURE) ×4 IMPLANT
SUT SILK 2 0SH CR/8 30 (SUTURE) ×1 IMPLANT
SUT VIC AB 2-0 SH 27 (SUTURE) ×2
SUT VIC AB 2-0 SH 27XBRD (SUTURE) ×2 IMPLANT
SUT VIC AB 3-0 SH 27 (SUTURE) ×1
SUT VIC AB 3-0 SH 27X BRD (SUTURE) IMPLANT
SYR 20ML LL LF (SYRINGE) ×3 IMPLANT
TAPE MICROFOAM 4IN (TAPE) ×2 IMPLANT
WATER STERILE IRR 1000ML POUR (IV SOLUTION) ×2 IMPLANT
WATER STERILE IRR 500ML POUR (IV SOLUTION) ×2 IMPLANT

## 2021-08-12 NOTE — Anesthesia Postprocedure Evaluation (Signed)
Anesthesia Post Note  Patient: Danika L McBroom [place]  Procedure(s) Performed: TWO VENTRAL HERNIA REPAIR WITH MESH (Abdomen)  Patient location during evaluation: PACU Anesthesia Type: General Level of consciousness: awake and alert, oriented and patient cooperative Pain management: pain level controlled Vital Signs Assessment: post-procedure vital signs reviewed and stable Respiratory status: spontaneous breathing, nonlabored ventilation and respiratory function stable Cardiovascular status: blood pressure returned to baseline and stable Postop Assessment: adequate PO intake Anesthetic complications: no   No notable events documented.   Last Vitals:  Vitals:   08/12/21 1845 08/12/21 1900  BP: 133/72 133/72  Pulse: 73 78  Resp: 13 20  Temp: (!) 36.3 C (!) 36.4 C  SpO2: 94% 95%    Last Pain:  Vitals:   08/12/21 1900  TempSrc: Temporal  PainSc: 3                  Darrin Nipper

## 2021-08-12 NOTE — ED Provider Notes (Signed)
Emergency Medicine Provider Triage Evaluation Note  [name] , a 51 y.o. female  was evaluated in triage.  Pt complains of abdominal pain for the past 3 days. History of hernia repair. Tried taking a laxative and had several bowel movements. Did not help with pain. No vomiting or nausea. No fever  Review of Systems  Positive: Abdominal pain Negative: Vomiting  Physical Exam  There were no vitals taken for this visit. Gen:   Awake, no distress   Resp:  Normal effort  MSK:   Moves extremities without difficulty  Other:    Medical Decision Making  Medically screening exam initiated at 9:04 AM.  Appropriate orders placed.  [name] was informed that the remainder of the evaluation will be completed by another provider, this initial triage assessment does not replace that evaluation, and the importance of remaining in the ED until their evaluation is complete.    Victorino Dike, FNP 08/12/21 7619    Duffy Bruce, MD 08/12/21 [phone]

## 2021-08-12 NOTE — ED Provider Notes (Signed)
[facility] Provider Note    Event Date/Time   First MD Initiated Contact with Patient 08/12/21 1012     (approximate)   History   Abdominal Pain   HPI  [name] is a 51 y.o. female who presents to the ED for evaluation of Abdominal Pain I review note from neighboring walk in clinic, patient was seen there earlier this AM and referred here for RLQ abd pain.  Patient reports a history of ventral hernia repair remotely in the past.  Otherwise history of low-transverse cesarean section x1.   Patient presents to the ED for evaluation of 3 days of right-sided abdominal pain with associated nausea and diarrhea.  She reports feeling bloated and thought that she was just constipated, so took a dose of stool softeners without improvement of her symptoms.  She reports watery stools a few times per day for the past 3 days and nausea without emesis.  Reports poor p.o. intake and lack of appetite.  Denies dysuria, fever, syncope, trauma or falls.  Physical Exam   Triage Vital Signs: ED Triage Vitals  Enc Vitals Group     BP 08/12/21 0905 (!) 128/93     Pulse Rate 08/12/21 0905 82     Resp 08/12/21 0905 20     Temp 08/12/21 0905 98 F (36.7 C)     Temp Source 08/12/21 0905 Oral     SpO2 08/12/21 0904 97 %     Weight 08/12/21 0906 295 lb (133.8 kg)     Height 08/12/21 0906 5\' 1"  (1.549 m)     Head Circumference --      Peak Flow --      Pain Score 08/12/21 0906 9     Pain Loc --      Pain Edu? --      Excl. in [place]? --     Most recent vital signs: Vitals:   08/12/21 0905 08/12/21 1426  BP: (!) 128/93 130/78  Pulse: 82 85  Resp: 20 17  Temp: 98 F (36.7 C) 98.8 F (37.1 C)  SpO2: 97% 97%    General: Awake, no distress. CV:  Good peripheral perfusion.  Resp:  Normal effort.  Abd:  Minimal distention.  Well-healed midline supraumbilical surgical incision is noted.  Well-healed LTCS incision.  Just superior and to the right of her umbilicus  she has palpable knot that is a couple centimeters in diameter and is exquisitely tender to palpation.  Suspect incarcerated hernia.  Otherwise, she has tenderness and voluntary guarding throughout the rest of her abdomen Other:     ED Results / Procedures / Treatments   Labs (all labs ordered are listed, but only abnormal results are displayed) Labs Reviewed  COMPREHENSIVE METABOLIC PANEL - Abnormal; Notable for the following components:      Result Value   Calcium 8.8 (*)    All other components within normal limits  CBC - Abnormal; Notable for the following components:   WBC 14.8 (*)    Platelets 481 (*)    All other components within normal limits  URINALYSIS, ROUTINE W REFLEX MICROSCOPIC - Abnormal; Notable for the following components:   Color, Urine YELLOW (*)    APPearance HAZY (*)    Specific Gravity, Urine 1.032 (*)    Leukocytes,Ua TRACE (*)    All other components within normal limits  RESP PANEL BY RT-PCR (FLU A&B, COVID) ARPGX2  LIPASE, BLOOD  LACTIC ACID, PLASMA  LACTIC ACID, PLASMA  POC URINE PREG, ED    EKG    RADIOLOGY CT reviewed by me with ventral fat-containing hernia  Official radiology report(s): CT ABDOMEN PELVIS W CONTRAST  Result Date: 08/12/2021 CLINICAL DATA:  Abdominal pain for 3 days, history of hernia repair, concern for incarcerated ventral/incisional hernia to the right of the umbilicus EXAM: CT ABDOMEN AND PELVIS WITH CONTRAST TECHNIQUE: Multidetector CT imaging of the abdomen and pelvis was performed using the standard protocol following bolus administration of intravenous contrast. CONTRAST:  174mL OMNIPAQUE IOHEXOL 300 MG/ML  SOLN COMPARISON:  CT abdomen/pelvis 04/13/2020 FINDINGS: Lower chest: The lung bases are clear. The imaged heart is unremarkable. Hepatobiliary: A subcentimeter hypodense lesion in the inferior right hepatic lobe is unchanged, likely a small cyst. The liver is otherwise unremarkable. The gallbladder is unremarkable.  There is no biliary ductal dilatation. Pancreas: Unremarkable. Spleen: Multiple hypodense lesions are seen throughout the spleen, unchanged going back to 2012. Adrenals/Urinary Tract: The adrenals are unremarkable. The kidneys are unremarkable, with no focal lesion, stone, hydronephrosis, or hydroureter. The bladder is unremarkable. Stomach/Bowel: The stomach is unremarkable. There is no evidence of bowel obstruction. There is no abnormal bowel wall thickening or inflammatory change. There are scattered colonic diverticula without evidence of acute diverticulitis. The appendix is normal. Vascular/Lymphatic: Abdominal aorta is normal in course and caliber. The major branch vessels are patent. The main portal and splenic veins are patent. Reproductive: Uterus and adnexa are unremarkable. Other: There is no ascites or free air. There is a small fat containing umbilical hernia without evidence incarceration (2-34). Approximally 3.5 cm superior to the umbilicus, there is an additional ventral abdominal hernia with a 0.7 cm neck. This hernia contains only fat, but there is mild stranding and trace fluid within the fat suggesting strangulation. Musculoskeletal: There is no acute osseous abnormality or aggressive osseous lesion. IMPRESSION: 1. Fat containing ventral abdominal wall hernia proximally 3.5 cm superior to the umbilicus with stranding and trace free fluid raising suspicion for strangulation. No bowel is contained within this hernia. 2. Tiny fat containing umbilical hernia. 3. Diverticulosis without evidence of acute diverticulitis. Electronically Signed   By: Valetta Mole M.D.   On: 08/12/2021 12:54    PROCEDURES and INTERVENTIONS:  .1-3 Lead EKG Interpretation Performed by: Vladimir Crofts, MD Authorized by: Vladimir Crofts, MD     Interpretation: normal     ECG rate:  80   ECG rate assessment: normal     Rhythm: sinus rhythm     Ectopy: none     Conduction: normal    Medications  HYDROmorphone  (DILAUDID) injection 1 mg (1 mg Intravenous Given 08/12/21 1209)  ondansetron (ZOFRAN) injection 4 mg (4 mg Intravenous Given 08/12/21 1209)  lactated ringers bolus 1,000 mL (1,000 mLs Intravenous Bolus 08/12/21 1208)  iohexol (OMNIPAQUE) 300 MG/ML solution 100 mL (100 mLs Intravenous Contrast Given 08/12/21 1234)  ketorolac (TORADOL) 30 MG/ML injection 15 mg (15 mg Intravenous Given 08/12/21 1425)     IMPRESSION / MDM / ASSESSMENT AND PLAN / ED COURSE  I reviewed the triage vital signs and the nursing notes.  Obese 51 year old female presents to the ED with abdominal pain, with evidence of an incarcerated fat-containing ventral hernia requiring surgical admission due to poorly controlled pain.  Vital signs are reassuring.  Exam with palpable firm mass that I am unable to reduce with involuntary guarding at that site and lesser tenderness/voluntary guarding throughout the rest of her abdomen.  Blood work with mild leukocytosis, otherwise reassuring.  No metabolic  derangements, lactic acidosis or signs of acute cystitis.    Due to poorly controlled pain, consulted with general surgery who agrees to see the patient for likely admission and surgical repair  Clinical Course as of 08/12/21 1525  Tue Aug 12, 2021  1045 I discussed with the patient my concern for incarcerated hernia, pain medicine and my attempt at reduction.  Discussed CT scans and the possibility of needing surgery.  She is in agreement [DS]  1409 Call back from rotating scrub nurse who says Dr. Adora Fridge is busy in a procedure and will call me back.  I let her know that I have a patient with an incarcerated hernia [DS]  Export [SH]  1412 I discussed with Dr. Dahlia Byes who recommends ice to the affected area, anti-inflammatories and pain control.  This can be managed as an outpatient does not require emergent surgery because it is only fat-containing.  We discussed additional measures for analgesia here in the ED, and  reassessment.  If still has poorly controlled pain we can call him back to discuss surgery. [DS]  1937 Reassessed.  Continues to have 8 or 9/10 pain.  We discussed CT results and my conversation with surgery.  She expresses understanding agreement.  We discussed reassessments and disposition pending the subsequent reassessment [DS]  1514 Reassessed.  Still a lot of pain.  We will reach out to surgery [DS]  1525 I discuss with Dr. Dahlia Byes [DS]    Clinical Course User Index [DS] Vladimir Crofts, MD [SH] Jacinto Halim     FINAL CLINICAL IMPRESSION(S) / ED DIAGNOSES   Final diagnoses:  Incarcerated hernia     Rx / DC Orders   ED Discharge Orders     None        Note:  This document was prepared using Dragon voice recognition software and may include unintentional dictation errors.   Vladimir Crofts, MD 08/12/21 [id number]

## 2021-08-12 NOTE — Op Note (Signed)
Recurrent incisional  Hernia Repair x 2 with ventralex Mesh 6.4 cm epigastric and umbilical hernia 4.3 cm ventralex  Mesh  Pre-operative Diagnosis: Incarcerated incisional epigastric hernia. Umbilical hernia  Post-operative Diagnosis: same  Surgeon: Caroleen Hamman, MD FACS  Anesthesia: Gen. with endotracheal tube   Findings: Ischemic incarcerated  preperitoneal fat incisional epigastric hernia 3.5 cm epigastric incarcerated hernia Reducible umbilical hernia 2 cms Total length of the repair hernia defects 10.5 cms  Estimated Blood Loss: 10cc                Specimens: sac preperitoneal fat       Complications: none              Procedure Details  The patient was seen again in the Holding Room. The benefits, complications, treatment options, and expected outcomes were discussed with the patient. The risks of bleeding, infection, recurrence of symptoms, failure to resolve symptoms, bowel injury, mesh placement, mesh infection, any of which could require further surgery were reviewed with the patient. The likelihood of improving the patient's symptoms with return to their baseline status is good.  The patient and/or family concurred with the proposed plan, giving informed consent.  The patient was taken to Operating Room, identified as Jennipher L McBroom [place] and the procedure verified.  A Time Out was held and the above information confirmed.  Prior to the induction of general anesthesia, antibiotic prophylaxis was administered. VTE prophylaxis was in place. General endotracheal anesthesia was then administered and tolerated well. After the induction, the abdomen was prepped with Chloraprep and draped in the sterile fashion. The patient was positioned in the supine position.   Incision was created with a scalpel over the hernia defect. Electrocautery was used to dissect through subcutaneous tissue, the hernia sac was opened preperitoneal ischemic fat was encountered and excised with cautery.  Hernia sac was excised. The hernia was measured, epigastric defect measured 3.5 cms, the mesh was selected.6.4 cm mesh selected.   Interrupted Ethibon sutures were placed on each corner of the mesh and secured to the abdominal wall.I closed the hernia defect with interrupted 0 Ethibond sutures.  The mesh was placed in an underlay fashion  Attention was placed to the lower portion of the midline fascia where another defect was encountered measured 2 cms.   Hernia sac was excised. The hernia was measured, umbilical defect measured 2 cms, the mesh was selected.4.3cm mesh selected.   Interrupted Ethibon sutures were placed on each corner of the mesh and secured to the abdominal wall.I closed the hernia defect with interrupted 0 Ethibond sutures.  The mesh was placed in an underlay fashion The gap between the ventral defects was 5 cms. Total segment of repair 10.5cms   Incisions were closed in a 2 layer fashion with 3-0 Vicryl and 4-0 Monocryl. Dermabond was used to coat the skin. Li;posomal marcaine used to do full thickness block on each side of the abdominal wall.. Patient tolerated procedure well and there were no immediate complications. Needle and laparotomy counts were correct   Caroleen Hamman, MD, FACS

## 2021-08-12 NOTE — Anesthesia Preprocedure Evaluation (Addendum)
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; residual right leg weakness)    GI/Hepatic Incarcerated ventral hernia   Endo/Other  Class 3 obesity  Renal/GU negative Renal ROS     Musculoskeletal   Abdominal   Peds  Hematology negative hematology ROS (+)   Anesthesia Other Findings   Reproductive/Obstetrics                            Anesthesia Physical Anesthesia Plan  ASA: 3 and emergent  Anesthesia Plan: General   Post-op Pain Management:    Induction: Intravenous and Rapid sequence  PONV Risk Score and Plan: 3 and Ondansetron, Dexamethasone and Treatment may vary due to age or medical condition  Airway Management Planned: Oral ETT  Additional Equipment:   Intra-op Plan:   Post-operative Plan: Extubation in OR  Informed Consent: I have reviewed the patients History and Physical, chart, labs and discussed the procedure including the risks, benefits and alternatives for the proposed anesthesia with the patient or authorized representative who has indicated his/her understanding and acceptance.     Dental advisory given  Plan Discussed with: CRNA  Anesthesia Plan Comments: (Patient is Jehovah's Witness and declines blood products, even in event to save life.  Patient consented for risks of anesthesia including but not limited to:   - adverse reactions to medications - damage to eyes, teeth, lips or other oral mucosa - nerve damage due to positioning  - sore throat or hoarseness - damage to heart, brain, nerves, lungs, other parts of body or loss of life  Informed patient about role of CRNA in peri- and intra-operative care.  Patient voiced understanding.)       Anesthesia Quick Evaluation

## 2021-08-12 NOTE — Anesthesia Procedure Notes (Signed)
Procedure Name: Intubation Date/Time: 08/12/2021 4:53 PM Performed by: Tollie Eth, CRNA Pre-anesthesia Checklist: Patient identified, Patient being monitored, Timeout performed, Emergency Drugs available and Suction available Patient Re-evaluated:Patient Re-evaluated prior to induction Oxygen Delivery Method: Circle system utilized Preoxygenation: Pre-oxygenation with 100% oxygen Induction Type: IV induction and Rapid sequence Laryngoscope Size: 3 and McGraph Grade View: Grade I Tube type: Oral Tube size: 7.0 mm Number of attempts: 1 Airway Equipment and Method: Stylet and Video-laryngoscopy Placement Confirmation: ETT inserted through vocal cords under direct vision, positive ETCO2 and breath sounds checked- equal and bilateral Secured at: 20 cm Tube secured with: Tape Dental Injury: Teeth and Oropharynx as per pre-operative assessment

## 2021-08-12 NOTE — Transfer of Care (Signed)
Immediate Anesthesia Transfer of Care Note  Patient: [name]  Procedure(s) Performed: TWO VENTRAL HERNIA REPAIR WITH MESH (Abdomen)  Patient Location: PACU  Anesthesia Type:General  Level of Consciousness: awake, alert  and oriented  Airway & Oxygen Therapy: Patient Spontanous Breathing and Patient connected to face mask oxygen  Post-op Assessment: Report given to RN and Post -op Vital signs reviewed and stable  Post vital signs: Reviewed and stable  Last Vitals:  Vitals Value Taken Time  BP 109/55 08/12/21 1815  Temp    Pulse 86 08/12/21 1816  Resp 25 08/12/21 1816  SpO2 100 % 08/12/21 1816  Vitals shown include unvalidated device data.  Last Pain:  Vitals:   08/12/21 1536  TempSrc: Oral  PainSc:       Patients Stated Pain Goal: 0 (71/24/58 0998)  Complications: No notable events documented.

## 2021-08-12 NOTE — ED Triage Notes (Addendum)
Pt states that she has been having pain in her right mid quadrant, states that she noticed a knot to that part of her abd on sat and states that she took a laxative and states that the pain hasn't gotten better. Pt has a scar in the area of her pain and states that its from a previous hernia repair

## 2021-08-12 NOTE — Progress Notes (Signed)
Patient awake/alert x4. Exp wheezes bil upper, alb. Neb x1 with effective results. Reviewed discharge instructions with patient and husband, verbalizes understanding of procedure and plan of care.

## 2021-08-12 NOTE — Discharge Instructions (Addendum)
Open Hernia Repair, Adult, Care After What can I expect after the procedure? After the procedure, it is common to have: Mild discomfort. Slight bruising. Mild swelling. Pain in the belly (abdomen). A small amount of blood from the cut from surgery (incision). Follow these instructions at home: Your doctor may give you more specific instructions. If you have problems, call your doctor. Medicines Take over-the-counter and prescription medicines only as told by your doctor. If told, take steps to prevent problems with pooping (constipation). You may need to: Drink enough fluid to keep your pee (urine) pale yellow. Take medicines. You will be told what medicines to take. Eat foods that are high in fiber. These include beans, whole grains, and fresh fruits and vegetables. Limit foods that are high in fat and sugar. These include fried or sweet foods. Ask your doctor if you should avoid driving or using machines while you are taking your medicine. Incision care Follow instructions from your doctor about how to take care of your incision. Make sure you: Wash your hands with soap and water for at least 20 seconds before and after you change your bandage (dressing). If you cannot use soap and water, use hand sanitizer. Change your bandage. Leave stitches or skin glue in place for at least 2 weeks. Leave tape strips alone unless you are told to take them off. You may trim the edges of the tape strips if they curl up. Check your incision every day for signs of infection. Check for: More redness, swelling, or pain. More fluid or blood. Warmth. Pus or a bad smell. Wear loose, soft clothing while your incision heals. Activity Rest as told by your doctor. Do not lift anything that is heavier than 10 lb (4.5 kg), or the limit that you are told. Do not play contact sports until your doctor says that this is safe. If you were given a sedative during your procedure, do not drive or use machines until  your doctor says that it is safe. A sedative is a medicine that helps you relax. Return to your normal activities when your doctor says that it is safe. General instructions Do not take baths, swim, or use a hot tub. Ask your doctor about taking showers or sponge baths. Hold a pillow over your belly when you cough or sneeze. This helps with pain. Do not smoke or use any products that contain nicotine or tobacco. If you need help quitting, ask your doctor. Keep all follow-up visits. Contact a doctor if: You have any of these signs of infection in or around your incision: More redness, swelling, or pain. More fluid or blood. Warmth. Pus. A bad smell. You have a fever or chills. You have blood in your poop (stool). You have not pooped (had a bowel movement) in 2-3 days. Medicine does not help your pain. Get help right away if: You have chest pain, or you are short of breath. You feel faint or light-headed. You have very bad pain. You vomit and your pain is worse. You have pain, swelling, or redness in a leg. These symptoms may be an emergency. Get help right away. Call your local emergency services (911 in the [place] Do not wait to see if the symptoms will go away. Do not drive yourself to the hospital. Summary After this procedure, it is common to have mild discomfort, slight bruising, and mild swelling. Follow instructions from your doctor about how to take care of your cut from surgery (incision). Check every day for  signs of infection. Do not lift heavy objects or play contact sports until your doctor says it is safe. Return to your normal activities as told by your doctor. This information is not intended to replace advice given to you by your health care provider. Make sure you discuss any questions you have with your health care provider. Document Revised: 03/11/2020 Document Reviewed: 03/11/2020 Elsevier Patient Education  2022 [employer]   The drugs that you were given will stay in your system until tomorrow so for the next 24 hours you should not:  Drive an automobile Make any legal decisions Drink any alcoholic beverage   You may resume regular meals tomorrow.  Today it is better to start with liquids and gradually work up to solid foods.  You may eat anything you prefer, but it is better to start with liquids, then soup and crackers, and gradually work up to solid foods.   Please notify your doctor immediately if you have any unusual bleeding, trouble breathing, redness and pain at the surgery site, drainage, fever, or pain not relieved by medication.    Your post-operative visit with Dr.                                       is: Date:                        Time:    Please call to schedule your post-operative visit.  Additional Instructions:

## 2021-08-12 NOTE — H&P (Signed)
Patient ID: [name], female   DOB: 08-05-1971, 51 y.o.   MRN: [id number]  HPI [name] is a 51 y.o. female seen in consultation at the request of Dr. Tamala Julian for incarcerated incisional hernia.  She has been hurting for the last 3 days.  Pain is constant for the last day worsening with Valsalva and with activity.  Currently pain is severe.  She has received several rounds of IV Dilaudid ice without any improvement in pain.  She reports some decrease in appetite.  She did have a history of open ventral hernia repair more than 20 years ago.  She also had a prior C-section.  She is able to perform more than 4 METS of activity without any shortness of breath or chest pain.  She does have a history of a BMI of 56. No fevers or chills. CBC 14.8 PL 481k, rest cbc nml, CMP nml CT scan personally reviewed showing evidence of a recurrent incisional epigastric hernia with inflammatory changes within the omentum that seems to be incarcerated.  There is an additional umbilical hernia.  There is no evidence of bowel compromise.  HPI  Past Medical History:  Diagnosis Date   Asthma    Difficulty breathing    Family history of breast cancer    BRCA testing letter sent; 3/22 genetic testing letter sent   Morbid obesity with BMI of 50.0-59.9, adult [facility])    Sinus complaint    Stroke [facility])     Past Surgical History:  Procedure Laterality Date   [place]   COLONOSCOPY     COLONOSCOPY N/A 07/11/2019   Procedure: COLONOSCOPY;  Surgeon: Virgel Manifold, MD;  Location: [facility] ENDOSCOPY;  Service: Endoscopy;  Laterality: N/A;   COLONOSCOPY WITH PROPOFOL N/A 03/17/2021   Procedure: COLONOSCOPY WITH PROPOFOL;  Surgeon: Lesly Rubenstein, MD;  Location: [facility] ENDOSCOPY;  Service: Endoscopy;  Laterality: N/A;   FOOT SURGERY Left 2013   HERNIA REPAIR      Family History  Problem Relation Age of Onset   Diabetes Mother    Uterine cancer Mother    Colon cancer Mother     Breast cancer Mother        30s/40s   Cancer - Lung Father    Colon cancer Father    Diverticulitis Brother    Hernia Brother        x2 surgeries   Colon cancer Paternal Grandmother    Lung cancer Paternal Grandmother     Social History Social History   Tobacco Use   Smoking status: Never   Smokeless tobacco: Never  Vaping Use   Vaping Use: Never used  Substance Use Topics   Alcohol use: Yes    Comment: occasionally   Drug use: No    Allergies  Allergen Reactions   Advil [Ibuprofen] Other (See Comments)    Other Reaction: Other reaction-swells   Azithromycin Rash    PATIENT UNSURE IF RASH WAS ACTUALLY DUE TO ABX     Current Facility-Administered Medications  Medication Dose Route Frequency Provider Last Rate Last Admin   cefoTEtan (CEFOTAN) 2 g in sodium chloride 0.9 % 100 mL IVPB  2 g Intravenous Q12H Yamila Cragin F, MD       Current Outpatient Medications  Medication Sig Dispense Refill   albuterol (VENTOLIN HFA) 108 (90 Base) MCG/ACT inhaler Inhale 2 puffs into the lungs as needed (for shortness of breath). 8 g 1   ascorbic acid (VITAMIN C)  250 MG tablet Take 1 tablet (250 mg total) by mouth daily. 30 tablet 0   aspirin 81 MG chewable tablet Chew 1 tablet (81 mg total) by mouth daily. 30 tablet 0   buPROPion (WELLBUTRIN XL) 150 MG 24 hr tablet Take 150 mg by mouth daily.     Cholecalciferol 25 MCG (1000 UT) capsule Take 1,000 Units by mouth daily.     Cinnamon 500 MG capsule Take 500 mg by mouth daily.     furosemide (LASIX) 20 MG tablet Take 20 mg by mouth daily.     metFORMIN (GLUCOPHAGE) 1000 MG tablet Take 1 tablet (1,000 mg total) by mouth 2 (two) times daily with a meal. 60 tablet 5   mometasone-formoterol (DULERA) 200-5 MCG/ACT AERO Inhale 2 puffs into the lungs 2 (two) times daily.     Omega-3 Fatty Acids (FISH OIL) 1000 MG CAPS Take 1 capsule by mouth daily.     rOPINIRole (REQUIP) 0.5 MG tablet Take 0.5 mg by mouth at bedtime.     rosuvastatin (CRESTOR)  20 MG tablet Take 20 mg by mouth daily.     Semaglutide,0.25 or 0.5MG/DOS, 2 MG/1.5ML SOPN Inject 0.25 mg into the skin once a week.     vitamin B-12 (CYANOCOBALAMIN) 1000 MCG tablet Take 1,000 mcg by mouth daily.     zinc sulfate 220 (50 Zn) MG capsule Take 1 capsule (220 mg total) by mouth daily. 30 capsule 0   montelukast (SINGULAIR) 10 MG tablet Take 1 tablet by mouth daily.     ondansetron (ZOFRAN) 4 MG tablet Take 1 tablet (4 mg total) by mouth every 8 (eight) hours as needed for nausea or vomiting. (Patient not taking: Reported on 11/20/2020) 20 tablet 0   phentermine (ADIPEX-P) 37.5 MG tablet One tablet po in morning. (Patient not taking: Reported on 08/12/2021) 30 tablet 1   predniSONE (DELTASONE) 10 MG tablet Day 1-2: Take 75m(5 pills) Day 3-4: Take 421m4) Day 5-6: 3048m) Day 7-8: 70m68m Day 9:10mg61m9 tablet 0     Review of Systems Full ROS  was asked and was negative except for the information on the HPI  Physical Exam Blood pressure 131/77, pulse 88, temperature 98.8 F (37.1 C), temperature source Oral, resp. rate 17, height 5' 1"  (1.549 m), weight 133.8 kg, SpO2 98 %. CONSTITUTIONAL: NAD Obese. EYES: Pupils are equal, round, and reactive to light, Sclera are non-icteric. EARS, NOSE, MOUTH AND THROAT: She is wearing a mask. Hearing is intact to voice. LYMPH NODES:  Lymph nodes in the neck are normal. RESPIRATORY:  Lungs are clear. There is normal respiratory effort, with equal breath sounds bilaterally, and without pathologic use of accessory muscles. CARDIOVASCULAR: Heart is regular without murmurs, gallops, or rubs. GI: The abdomen is  soft, exquisitely tender abdominal wall to palpation incarcerated ventral hernia.  Generous pannus that limits the exam, there are no palpable masses. There is no hepatosplenomegaly. There are normal bowel sounds  GU: Rectal deferred.   MUSCULOSKELETAL: Normal muscle strength and tone. No cyanosis or edema.   SKIN: Turgor is good and there  are no pathologic skin lesions or ulcers. NEUROLOGIC: Motor and sensation is grossly normal. Cranial nerves are grossly intact. PSYCH:  Oriented to person, place and time. Affect is normal.  Data Reviewed I have personally reviewed the patient's imaging, laboratory findings and medical records.    Assessment/Plan 50 ye55 old female with incarcerated recurrent incisional ventral hernia.  There is no evidence of bowel compromise but there is severe pain and she  has been unable to manage medically in the ER after multiple doses of NSAIDs and morphine.  He is got some rebound tenderness.  Discussed with patient detail about options she wishes to proceed urgently for a repair of recurrent incisional hernia.  Procedure discussed with the patient in detail.  Risks, benefits and possible indications including but not limited to: Bleeding, infection injury to adjacent organs, chronic pain.  Depending on operative findings we may choose to repair the additional umbilical hernia. She understands she is at higher risks of developing complications due to high BMI, steroid use and recurrent hx.  Caroleen Hamman, MD FACS General Surgeon 08/12/2021, 3:48 PM

## 2021-08-13 ENCOUNTER — Encounter: Payer: Self-pay | Admitting: Surgery

## 2021-08-14 LAB — SURGICAL PATHOLOGY

## 2021-08-21 ENCOUNTER — Ambulatory Visit: Admission: RE | Admit: 2021-08-21 | Payer: BC Managed Care – PPO | Source: Ambulatory Visit

## 2021-08-25 DIAGNOSIS — Z8659 Personal history of other mental and behavioral disorders: Secondary | ICD-10-CM | POA: Insufficient documentation

## 2021-08-25 DIAGNOSIS — G479 Sleep disorder, unspecified: Secondary | ICD-10-CM | POA: Insufficient documentation

## 2021-08-25 DIAGNOSIS — R296 Repeated falls: Secondary | ICD-10-CM | POA: Insufficient documentation

## 2021-08-25 DIAGNOSIS — R29898 Other symptoms and signs involving the musculoskeletal system: Secondary | ICD-10-CM | POA: Insufficient documentation

## 2021-09-02 ENCOUNTER — Other Ambulatory Visit: Payer: Self-pay

## 2021-09-02 ENCOUNTER — Encounter: Payer: Self-pay | Admitting: Physician Assistant

## 2021-09-02 ENCOUNTER — Ambulatory Visit (INDEPENDENT_AMBULATORY_CARE_PROVIDER_SITE_OTHER): Payer: BC Managed Care – PPO | Admitting: Physician Assistant

## 2021-09-02 VITALS — BP 127/84 | HR 86 | Temp 99.0°F | Ht 61.0 in | Wt 308.4 lb

## 2021-09-02 DIAGNOSIS — Z09 Encounter for follow-up examination after completed treatment for conditions other than malignant neoplasm: Secondary | ICD-10-CM | POA: Diagnosis not present

## 2021-09-02 DIAGNOSIS — K430 Incisional hernia with obstruction, without gangrene: Secondary | ICD-10-CM | POA: Diagnosis not present

## 2021-09-02 NOTE — Patient Instructions (Addendum)
We removed the staples today and placed steri strips. Shower as usual. Be sure to pat dry the strips.    GENERAL POST-OPERATIVE PATIENT INSTRUCTIONS   WOUND CARE INSTRUCTIONS:  Keep a dry clean dressing on the wound if there is drainage. The initial bandage may be removed after 24 hours.  Once the wound has quit draining you may leave it open to air.  If clothing rubs against the wound or causes irritation and the wound is not draining you may cover it with a dry dressing during the daytime.  Try to keep the wound dry and avoid ointments on the wound unless directed to do so.  If the wound becomes bright red and painful or starts to drain infected material that is not clear, please contact your physician immediately.  If the wound is mildly pink and has a thick firm ridge underneath it, this is normal, and is referred to as a healing ridge.  This will resolve over the next 4-6 weeks.  BATHING: You may shower if you have been informed of this by your surgeon. However, Please do not submerge in a tub, hot tub, or pool until incisions are completely sealed or have been told by your surgeon that you may do so.  DIET:  You may eat any foods that you can tolerate.  It is a good idea to eat a high fiber diet and take in plenty of fluids to prevent constipation.  If you do become constipated you may want to take a mild laxative or take ducolax tablets on a daily basis until your bowel habits are regular.  Constipation can be very uncomfortable, along with straining, after recent surgery.  ACTIVITY:  You are encouraged to cough and deep breath or use your incentive spirometer if you were given one, every 15-30 minutes when awake.  This will help prevent respiratory complications and low grade fevers post-operatively if you had a general anesthetic.  You may want to hug a pillow when coughing and sneezing to add additional support to the surgical area, if you had abdominal or chest surgery, which will decrease  pain during these times.  You are encouraged to walk and engage in light activity for the next two weeks.  You should not lift more than 20 pounds, until 09/23/2021 as it could put you at increased risk for complications.  Twenty pounds is roughly equivalent to a plastic bag of groceries. At that time- Listen to your body when lifting, if you have pain when lifting, stop and then try again in a few days. Soreness after doing exercises or activities of daily living is normal as you get back in to your normal routine.  MEDICATIONS:  Try to take narcotic medications and anti-inflammatory medications, such as tylenol, ibuprofen, naprosyn, etc., with food.  This will minimize stomach upset from the medication.  Should you develop nausea and vomiting from the pain medication, or develop a rash, please discontinue the medication and contact your physician.  You should not drive, make important decisions, or operate machinery when taking narcotic pain medication.  SUNBLOCK Use sun block to incision area over the next year if this area will be exposed to sun. This helps decrease scarring and will allow you avoid a permanent darkened area over your incision.  QUESTIONS:  Please feel free to call our office if you have any questions, and we will be glad to assist you. [phone]

## 2021-09-02 NOTE — Progress Notes (Signed)
[facility] POST-OP OFFICE VISIT  09/02/2021  HPI: [name] is a 51 y.o. female 21 days s/p robotic assisted laparoscopic incisional hernia (x2) and umbilical hernia repairs with Dr Dahlia Byes.   She is doing well; only using tylenol for pain Some soreness at the staples and incision No fever, chills, nausea, emesis, or bowel changes She is following her restrictions  Vital signs: BP 127/84    Pulse 86    Temp 99 F (37.2 C) (Oral)    Ht 5\' 1"  (1.549 m)    Wt (!) 308 lb 6.4 oz (139.9 kg)    SpO2 97%    BMI 58.27 kg/m    Physical Exam: Constitutional: Well appearing female, NAD Abdomen: Soft, incisional soreness around staples, non-distended, no rebound/guarding Skin: Midline ventral incision is healing well; staples remove  Assessment/Plan: This is a 51 y.o. female 21 days s/p robotic assisted laparoscopic incisional hernia (x2) and umbilical hernia repairs    - Pain control prn  - Reviewed wound care recommendation; staples removed; placed steri-strips  - Reviewed lifting restrictions; 6 weeks total  - She is doing well but prefers to follow up in a few weeks  Face-to-face time spent with the patient and care providers was 10 minutes, with more than 50% of the time spent counseling, educating, and coordinating care of the patient.    -- Edison Simon, PA-C [facility] 09/02/2021, 3:24 PM [phone] M-F: 7am - 4pm

## 2021-09-03 ENCOUNTER — Encounter: Payer: BC Managed Care – PPO | Admitting: Physician Assistant

## 2021-09-17 ENCOUNTER — Encounter: Payer: Self-pay | Admitting: Surgery

## 2021-09-17 ENCOUNTER — Ambulatory Visit (INDEPENDENT_AMBULATORY_CARE_PROVIDER_SITE_OTHER): Payer: BC Managed Care – PPO | Admitting: Surgery

## 2021-09-17 ENCOUNTER — Other Ambulatory Visit: Payer: Self-pay

## 2021-09-17 VITALS — BP 139/83 | HR 97 | Temp 98.9°F | Ht 61.0 in | Wt 301.0 lb

## 2021-09-17 DIAGNOSIS — K430 Incisional hernia with obstruction, without gangrene: Secondary | ICD-10-CM | POA: Diagnosis not present

## 2021-09-17 DIAGNOSIS — Z9889 Other specified postprocedural states: Secondary | ICD-10-CM

## 2021-09-17 DIAGNOSIS — Z8719 Personal history of other diseases of the digestive system: Secondary | ICD-10-CM

## 2021-09-17 DIAGNOSIS — Z09 Encounter for follow-up examination after completed treatment for conditions other than malignant neoplasm: Secondary | ICD-10-CM | POA: Diagnosis not present

## 2021-09-17 NOTE — Patient Instructions (Addendum)
Follow-up with our office as needed.  ° °Please call and ask to speak with a nurse if you develop questions or concerns. ° °GENERAL POST-OPERATIVE °PATIENT INSTRUCTIONS  ° °WOUND CARE INSTRUCTIONS: Try to keep the wound dry and avoid ointments on the wound unless directed to do so.  If the wound becomes bright red and painful or starts to drain infected material that is not clear, please contact your physician immediately.  If the wound is mildly pink and has a thick firm ridge underneath it, this is normal, and is referred to as a healing ridge.  This will resolve over the next 4-6 weeks. ° °BATHING: °You may shower if you have been informed of this by your surgeon. However, Please do not submerge in a tub, hot tub, or pool until incisions are completely sealed or have been told by your surgeon that you may do so. ° °DIET:  You may eat any foods that you can tolerate.  It is a good idea to eat a high fiber diet and take in plenty of fluids to prevent constipation.  If you do become constipated you may want to take a mild laxative or take ducolax tablets on a daily basis until your bowel habits are regular.  Constipation can be very uncomfortable, along with straining, after recent surgery. ° °ACTIVITY:  You are encouraged to walk and engage in light activity for the next two weeks.  You should not lift more than 20 pounds for 6 weeks after surgery as it could put you at increased risk for complications.  Twenty pounds is roughly equivalent to a plastic bag of groceries. At that time- Listen to your body when lifting, if you have pain when lifting, stop and then try again in a few days. Soreness after doing exercises or activities of daily living is normal as you get back in to your normal routine. ° °MEDICATIONS:  Try to take narcotic medications and anti-inflammatory medications, such as tylenol, ibuprofen, naprosyn, etc., with food.  This will minimize stomach upset from the medication.  Should you develop nausea  and vomiting from the pain medication, or develop a rash, please discontinue the medication and contact your physician.  You should not drive, make important decisions, or operate machinery when taking narcotic pain medication. ° °SUNBLOCK °Use sun block to incision area over the next year if this area will be exposed to sun. This helps decrease scarring and will allow you avoid a permanent darkened area over your incision. ° °QUESTIONS:  Please feel free to call our office if you have any questions, and we will be glad to assist you.  ° ° °

## 2021-09-19 NOTE — Progress Notes (Signed)
Outpatient Surgical Follow Up  09/19/2021  [name] is an 51 y.o. female.   Chief Complaint  Patient presents with   Routine Post Op    HPI: [name] is 5 weeks out after open repair of ventral hernias x2 with 2 separate meshes for incarceration.  She is doing well.  No fevers no chills minimal abdominal discomfort.  She is back to her baseline.  Her main complaint today is right knee pain.  This is being chronic she has been working with orthopedics. Tolerating diet  Past Medical History:  Diagnosis Date   Asthma    Difficulty breathing    Family history of breast cancer    BRCA testing letter sent; 3/22 genetic testing letter sent   Morbid obesity with BMI of 50.0-59.9, adult [facility])    Sinus complaint    Stroke [facility])     Past Surgical History:  Procedure Laterality Date   [place]   COLONOSCOPY     COLONOSCOPY N/A 07/11/2019   Procedure: COLONOSCOPY;  Surgeon: Virgel Manifold, MD;  Location: [facility] ENDOSCOPY;  Service: Endoscopy;  Laterality: N/A;   COLONOSCOPY WITH PROPOFOL N/A 03/17/2021   Procedure: COLONOSCOPY WITH PROPOFOL;  Surgeon: Lesly Rubenstein, MD;  Location: [facility] ENDOSCOPY;  Service: Endoscopy;  Laterality: N/A;   FOOT SURGERY Left 2013   HERNIA REPAIR     VENTRAL HERNIA REPAIR N/A 08/12/2021   Procedure: TWO VENTRAL HERNIA REPAIR WITH MESH;  Surgeon: Jules Husbands, MD;  Location: [facility] ORS;  Service: General;  Laterality: N/A;    Family History  Problem Relation Age of Onset   Diabetes Mother    Uterine cancer Mother    Colon cancer Mother    Breast cancer Mother        30s/40s   Cancer - Lung Father    Colon cancer Father    Diverticulitis Brother    Hernia Brother        x2 surgeries   Colon cancer Paternal Grandmother    Lung cancer Paternal Grandmother     Social History:  reports that she has never smoked. She has never been exposed to tobacco smoke. She has never used smokeless tobacco. She reports current alcohol use.  She reports that she does not use drugs.  Allergies:  Allergies  Allergen Reactions   Advil [Ibuprofen] Other (See Comments)    Other Reaction: Other reaction-swells   Azithromycin Rash    PATIENT UNSURE IF RASH WAS ACTUALLY DUE TO ABX     Medications reviewed.    ROS Full ROS performed and is otherwise negative other than what is stated in HPI   BP 139/83    Pulse 97    Temp 98.9 F (37.2 C)    Ht 5' 1"  (1.549 m)    Wt (!) 301 lb (136.5 kg)    SpO2 97%    BMI 56.87 kg/m   Physical Exam Vitals and nursing note reviewed. Exam conducted with a chaperone present.  Constitutional:      General: She is not in acute distress.    Appearance: Normal appearance. She is not ill-appearing.  Pulmonary:     Effort: No respiratory distress.     Breath sounds: No stridor.  Abdominal:     General: Abdomen is flat. There is no distension.     Palpations: Abdomen is soft. There is no mass.     Tenderness: There is no abdominal tenderness. There is no guarding or rebound.  Hernia: No hernia is present.     Comments: Incision healing well without evidence of infection.  No evidence of recurrence  Skin:    General: Skin is warm and dry.     Capillary Refill: Capillary refill takes less than 2 seconds.  Neurological:     General: No focal deficit present.     Mental Status: She is alert and oriented to person, place, and time.  Psychiatric:        Mood and Affect: Mood normal.        Behavior: Behavior normal.        Thought Content: Thought content normal.        Judgment: Judgment normal.        Assessment/Plan: [name] is a 51 year old female status post open ventral hernia repair x2 with 2 separate meshes.  She is doing well without complications or recurrences.  .  Encouraged her to optimize her weight.  Her BMI is 56.  She does have an increased chance of developing a recurrence Please note I spent greater than 20 minutes in his encounter including coordination of her care,  counseling the patient and performing appropriate documentation  Caroleen Hamman, MD [facility]

## 2021-09-22 ENCOUNTER — Ambulatory Visit (INDEPENDENT_AMBULATORY_CARE_PROVIDER_SITE_OTHER): Payer: BC Managed Care – PPO | Admitting: Obstetrics & Gynecology

## 2021-09-22 ENCOUNTER — Other Ambulatory Visit: Payer: Self-pay

## 2021-09-22 ENCOUNTER — Encounter: Payer: Self-pay | Admitting: Obstetrics & Gynecology

## 2021-09-22 VITALS — BP 120/80 | Ht 61.0 in | Wt 300.0 lb

## 2021-09-22 DIAGNOSIS — Z1231 Encounter for screening mammogram for malignant neoplasm of breast: Secondary | ICD-10-CM | POA: Diagnosis not present

## 2021-09-22 DIAGNOSIS — Z803 Family history of malignant neoplasm of breast: Secondary | ICD-10-CM | POA: Diagnosis not present

## 2021-09-22 DIAGNOSIS — Z01419 Encounter for gynecological examination (general) (routine) without abnormal findings: Secondary | ICD-10-CM | POA: Diagnosis not present

## 2021-09-22 NOTE — Patient Instructions (Addendum)
PAP every three years Mammogram every year    Call [phone] to schedule at [facility] after Mar 2 Colonoscopy every 10 years Labs yearly (with PCP)  Thank you for choosing [facility]. As part of our ongoing efforts to improve patient experience, we would appreciate your feedback. Please fill out the short survey that you will receive by mail or MyChart. Your opinion is important to [place]! - Dr. Baxter Flattery Panel Testing for Cancer  What is cancer? Normal cells in the body grow, divide, and are replaced on a routine basis. Sometimes, cells divide abnormally and begin to grow out of control. These cells may form growths or tumors. Tumors can be benign (not cancer) or malignant (cancer). Benign tumors do not spread to other body tissues. Cancer tumors can invade and destroy nearby healthy tissues, bones, and organs. Cancer cells also can spread to other parts of the body and form new cancerous areas.  What causes cancer? Cancer is caused by many different factors. A few types of cancer are caused by changes in genes that can be passed from parent to child. Changes in genes are called mutations. Certain gene mutations are associated with family cancer syndromes. What are family cancer syndromes?  Family cancer syndromes are genetic conditions that increase the risk of certain types of cancer. They also are called hereditary or inherited cancer syndromes. Common family cancer syndromes include hereditary breast and ovarian cancer (HBOC) syndrome, Lynch syndrome, Li-Fraumeni syndrome, Cowden syndrome, and Peutz-Jeghers syndrome. What is genetic testing for cancer? Genetic testing for cancer looks for mutations in certain genes that are known to be linked to cancer. The results can help determine your risk of developing a disease like cancer or passing on a genetic disorder. What is multigene panel testing? Multigene panel testing is a type of genetic testing that looks for mutations in several  genes at once. This is different from single-gene testing, which looks for a mutation in a specific gene. Single-gene testing is often used when there is already a known gene mutation in a family. For example, testing for BRCA mutations only looks for changes in BRCA1 and BRCA2 genes. Who should have genetic testing? You may consider genetic testing if your personal or family history shows that you have an increased risk of cancer. Your obstetrician-gynecologist (ob-gyn) or other health care professional may ask you these and other questions: Have you or any family members been diagnosed with cancer?  If yes, which family members were diagnosed, with what types of cancer, and at what ages?  Were you or any of your family members born with birth defects?  Are you of [place] or [place] Jewish ancestry? Depending on your answers, your ob-gyn or other health care professional may suggest that you talk about genetic testing with a genetic counselor or a physician who is an expert in genetics. You can choose to have genetic testing, or you can choose not to. Before you decide, you should have genetic counseling. What is genetic counseling? In genetic counseling, you will talk with a genetic counselor or physician expert about the following: Your risk of getting a hereditary type of cancer  Who in your family could potentially get tested  How testing is done  What the test results may mean  What you may do depending on the results How is genetic testing done? Genetic testing typically is done from a blood sample or saliva sample. When is multigene panel testing recommended? Multigene panel testing may be useful  if you are at risk of a family cancer syndrome that has more than one gene associated with it  have a personal or family history of cancer and single-gene testing has not found a mutation, or the result is uncertain What are the benefits of multigene panel testing? Multigene panel  testing looks at multiple genes with one test. If a gene mutation is found, multigene panel testing may give you a better understanding of your cancer risk than single-gene testing  help your health care team decide what cancer screenings you might need beyond routine screenings  help you think about what you can do to prevent cancer What are the risks of multigene panel testing? The risks of multigene panel testing may include the following: Results can be complicated to interpret.  Testing may find gene mutations that show a moderate or uncertain risk of cancer.  It may be hard to know what you should do with your test results. You should talk with a genetic counselor or physician expert before and after genetic testing to learn what the results mean. If I have a gene mutation, should I tell my family? Having a gene mutation means you can pass the mutation to your children. Your siblings also may have the gene mutation. Although you do not have to tell your family members, sharing the information could be life-saving for them. With this information, your family members can decide whether to be tested and get cancer screenings at an early age. How can I prevent cancer if I test positive for a gene mutation? If you test positive for a gene mutation, you can discuss cancer screening and prevention options with your ob-gyn, genetic counselor, or other health care professional. It may be helpful to have earlier or more frequent cancer screening tests, which can find cancer at an early and more curable stage. Risk reduction steps like medication, surgery, and lifestyle changes also may be recommended. Im concerned about discrimination based on genetic testing results. What should I know? Many people are concerned about possible employment discrimination or denial of insurance coverage based on genetic testing results. The Genetic Information Nondiscrimination Act of 2008 (GINA) makes it illegal for  health insurers to require genetic testing results or use results to make decisions about coverage, rates, or preexisting conditions. GINA also makes it illegal for employers to discriminate against employees or applicants because of genetic information. GINA does not apply to life insurance, long-term care insurance, or disability insurance. What should I know about direct-to-consumer genetic tests? Direct-to-consumer (or at-home) genetic tests are sold over the internet. You do not need a doctors order to get one. The [employer] discourages use of direct-to-consumer genetic tests because the results may be misleading. For example, one commercial test for BRCA mutations only looks for three mutations, even though there are more than 500 BRCA mutations linked to cancer. The test results could cause unnecessary fear, or a false sense that you are not at risk. You should see a health care professional if you want a genetic test.  Glossary BRCA1 and BRCA2: Genes that keep cells from growing too rapidly. Changes in these genes have been linked to an increased risk of breast cancer and ovarian cancer.  Cowden Syndrome: A genetic condition that increases a persons risk of cancer of the breast, thyroid, uterus, colon, kidney, and skin. Genes: Segments of DNA that contain instructions for the development of a persons physical traits and control of the processes in the  body. The gene is the basic unit of heredity and can be passed from parent to child. Genetic Counselor: A health care professional with special training in genetics who can provide expert advice about genetic disorders and prenatal testing. Hereditary Breast and Ovarian Cancer (HBOC) Syndrome: A genetic condition that increases a persons risk of cancer of the breast, ovary, prostate, pancreas, and skin (melanoma). Li-Fraumeni Syndrome: A genetic condition that increases a persons risk of cancer of the  breast, bones, soft tissue, brain, and outer layer of the adrenal glands. Lynch Syndrome: A genetic condition that increases a persons risk of cancer of the colon, rectum, ovary, uterus, pancreas, and bile duct. Multigene Panel Testing: A type of genetic test that can look for mutations in multiple genes at once. Mutations: Changes in genes that can be passed from parent to child. Obstetrician-Gynecologist (Ob-Gyn): A doctor with special training and education in womens health. Peutz-Jeghers Syndrome: A genetic condition that increases a persons risk of cancer of the stomach, intestines, pancreas, cervix, ovary, and breast.

## 2021-09-22 NOTE — Progress Notes (Signed)
HPI:      [name] is a 51 y.o. G2P1011 who LMP was in the past, she presents today for her annual examination.  The patient has no complaints today. The patient is sexually active. Herlast pap: approximate date 2022 and was normal and last mammogram: approximate date 2022 and was normal.  The patient does perform self breast exams.  There is notable family history of breast (Mother age 32 onset) or ovarian cancer in her family. The patient is not taking hormone replacement therapy. Patient denies post-menopausal vaginal bleeding.   The patient has regular exercise: yes. The patient denies current symptoms of depression.    Sees Dr Ouida Sills for weight loss Has occas hot flash No bleeding, bladder sx's  GYN Hx: Last Colonoscopy:1 year ago. Normal.  Last DEXA:  never  ago.    PMHx: Past Medical History:  Diagnosis Date   Asthma    Difficulty breathing    Family history of breast cancer    BRCA testing letter sent; 3/22 genetic testing letter sent   Morbid obesity with BMI of 50.0-59.9, adult [facility])    Sinus complaint    Stroke [facility])    Past Surgical History:  Procedure Laterality Date   [place]   COLONOSCOPY     COLONOSCOPY N/A 07/11/2019   Procedure: COLONOSCOPY;  Surgeon: Virgel Manifold, MD;  Location: [facility] ENDOSCOPY;  Service: Endoscopy;  Laterality: N/A;   COLONOSCOPY WITH PROPOFOL N/A 03/17/2021   Procedure: COLONOSCOPY WITH PROPOFOL;  Surgeon: Lesly Rubenstein, MD;  Location: [facility] ENDOSCOPY;  Service: Endoscopy;  Laterality: N/A;   FOOT SURGERY Left 2013   HERNIA REPAIR     VENTRAL HERNIA REPAIR N/A 08/12/2021   Procedure: TWO VENTRAL HERNIA REPAIR WITH MESH;  Surgeon: Jules Husbands, MD;  Location: [facility] ORS;  Service: General;  Laterality: N/A;   Family History  Problem Relation Age of Onset   Diabetes Mother    Uterine cancer Mother    Colon cancer Mother    Breast cancer Mother        30s/40s   Cancer - Lung Father    Colon  cancer Father    Diverticulitis Brother    Hernia Brother        x2 surgeries   Colon cancer Paternal Grandmother    Lung cancer Paternal Grandmother    Social History   Tobacco Use   Smoking status: Never    Passive exposure: Never   Smokeless tobacco: Never  Vaping Use   Vaping Use: Never used  Substance Use Topics   Alcohol use: Yes    Comment: occasionally   Drug use: No    Current Outpatient Medications:    albuterol (VENTOLIN HFA) 108 (90 Base) MCG/ACT inhaler, Inhale 2 puffs into the lungs as needed (for shortness of breath)., Disp: 8 g, Rfl: 1   ascorbic acid (VITAMIN C) 250 MG tablet, Take 1 tablet (250 mg total) by mouth daily., Disp: 30 tablet, Rfl: 0   aspirin 81 MG chewable tablet, Chew 1 tablet (81 mg total) by mouth daily., Disp: 30 tablet, Rfl: 0   buPROPion (WELLBUTRIN XL) 150 MG 24 hr tablet, Take 150 mg by mouth daily., Disp: , Rfl:    Cholecalciferol 25 MCG (1000 UT) capsule, Take 1,000 Units by mouth daily., Disp: , Rfl:    Cinnamon 500 MG capsule, Take 500 mg by mouth daily., Disp: , Rfl:    furosemide (LASIX) 20 MG tablet, Take 20  mg by mouth daily., Disp: , Rfl:    metFORMIN (GLUCOPHAGE) 1000 MG tablet, Take 1 tablet (1,000 mg total) by mouth 2 (two) times daily with a meal., Disp: 60 tablet, Rfl: 5   mometasone-formoterol (DULERA) 200-5 MCG/ACT AERO, Inhale 2 puffs into the lungs 2 (two) times daily., Disp: , Rfl:    Omega-3 Fatty Acids (FISH OIL) 1000 MG CAPS, Take 1 capsule by mouth daily., Disp: , Rfl:    ondansetron (ZOFRAN) 4 MG tablet, Take 1 tablet (4 mg total) by mouth every 8 (eight) hours as needed for nausea or vomiting., Disp: 20 tablet, Rfl: 0   OZEMPIC, 1 MG/DOSE, 4 MG/3ML SOPN, Inject 1 mg into the skin once a week., Disp: , Rfl:    phentermine (ADIPEX-P) 37.5 MG tablet, One tablet po in morning., Disp: 30 tablet, Rfl: 1   predniSONE (DELTASONE) 10 MG tablet, Day 1-2: Take 74m(5 pills) Day 3-4: Take 473m4) Day 5-6: 3087m) Day 7-8: 52m61m  Day 9:10mg24mDisp: 29 tablet, Rfl: 0   rOPINIRole (REQUIP) 0.5 MG tablet, Take 0.5 mg by mouth at bedtime., Disp: , Rfl:    rosuvastatin (CRESTOR) 20 MG tablet, Take 20 mg by mouth daily., Disp: , Rfl:    vitamin B-12 (CYANOCOBALAMIN) 1000 MCG tablet, Take 1,000 mcg by mouth daily., Disp: , Rfl:    zinc sulfate 220 (50 Zn) MG capsule, Take 1 capsule (220 mg total) by mouth daily., Disp: 30 capsule, Rfl: 0   montelukast (SINGULAIR) 10 MG tablet, Take 1 tablet by mouth daily., Disp: , Rfl:  Allergies: Advil [ibuprofen] and Azithromycin  Review of Systems  Constitutional:  Positive for malaise/fatigue. Negative for chills and fever.  HENT:  Negative for congestion, sinus pain and sore throat.   Eyes:  Negative for blurred vision and pain.  Respiratory:  Positive for wheezing. Negative for cough.   Cardiovascular:  Negative for chest pain and leg swelling.  Gastrointestinal:  Negative for abdominal pain, constipation, diarrhea, heartburn, nausea and vomiting.  Genitourinary:  Negative for dysuria, frequency, hematuria and urgency.  Musculoskeletal:  Negative for back pain, joint pain, myalgias and neck pain.  Skin:  Negative for itching and rash.  Neurological:  Positive for dizziness. Negative for tremors and weakness.  Endo/Heme/Allergies:  Bruises/bleeds easily.  Psychiatric/Behavioral:  Positive for depression. The patient is not nervous/anxious and does not have insomnia.    Objective: BP 120/80    Ht 5' 1"  (1.549 m)    Wt 300 lb (136.1 kg)    BMI 56.68 kg/m   Filed Weights   09/22/21 1319  Weight: 300 lb (136.1 kg)   Body mass index is 56.68 kg/m. OBGyn Exam  Assessment: Annual Exam 1. Women's annual routine gynecological examination   2. Encounter for screening mammogram for malignant neoplasm of breast   3. Family history of breast cancer     Plan:            1.  Cervical Screening-  Pap smear schedule reviewed with patient, Pap smear to be scheduled, 2025  2. Breast  screening- Exam annually and mammogram scheduled  3. Colonoscopy every 10 years, Hemoccult testing after age 9  89  4s managed by PCP  5. Counseling for hormonal therapy: none              6. FRAX - FRAX score for assessing the 10 year probability for fracture calculated and discussed today.  Based on age and score today, DEXA is not scheduled.   7.  She presents  with a significant personal and/or family history of breast cancer mother onset 49. Details of which can be found in her medical/family history. She does not have a previously identified BRCA and Lynch syndrome mutation in her family. Due to her personal and/or family history of cancer she is a candidate for the [facility] test(s).    Risk for cancer, genetic susceptibility discussed.  Patient has requested gene testing.  Discussed BRCA as well as Lynch syndrome and other cancer risk assessments available based on her family history and personal history. Pros and cons of testing discussed.     F/U  Return for and Annual when due.  Barnett Applebaum, MD, [facility], [facility] 09/22/2021  1:47 PM

## 2021-10-08 DIAGNOSIS — Z1371 Encounter for nonprocreative screening for genetic disease carrier status: Secondary | ICD-10-CM

## 2021-10-08 HISTORY — DX: Encounter for nonprocreative screening for genetic disease carrier status: Z13.71

## 2021-10-14 ENCOUNTER — Other Ambulatory Visit: Payer: Self-pay | Admitting: Obstetrics and Gynecology

## 2021-10-14 ENCOUNTER — Encounter: Payer: Self-pay | Admitting: Obstetrics and Gynecology

## 2021-10-14 DIAGNOSIS — Z803 Family history of malignant neoplasm of breast: Secondary | ICD-10-CM

## 2021-10-14 NOTE — Progress Notes (Signed)
MyRisk order, done with Dr Kenton Kingfisher 2/23 ?

## 2021-10-20 ENCOUNTER — Other Ambulatory Visit: Payer: Self-pay

## 2021-10-20 ENCOUNTER — Ambulatory Visit
Admission: RE | Admit: 2021-10-20 | Discharge: 2021-10-20 | Disposition: A | Discharge status: Home / Self Care | Payer: BC Managed Care – PPO | Source: Ambulatory Visit | Attending: Obstetrics & Gynecology | Admitting: Obstetrics & Gynecology

## 2021-10-20 DIAGNOSIS — Z1231 Encounter for screening mammogram for malignant neoplasm of breast: Secondary | ICD-10-CM | POA: Insufficient documentation

## 2021-10-20 DIAGNOSIS — Z803 Family history of malignant neoplasm of breast: Secondary | ICD-10-CM | POA: Insufficient documentation

## 2021-10-24 ENCOUNTER — Ambulatory Visit: Payer: BC Managed Care – PPO | Admitting: Obstetrics & Gynecology

## 2021-10-24 ENCOUNTER — Other Ambulatory Visit: Payer: Self-pay

## 2021-10-24 ENCOUNTER — Ambulatory Visit: Payer: BC Managed Care – PPO

## 2021-11-18 ENCOUNTER — Encounter: Payer: Self-pay | Admitting: Obstetrics & Gynecology

## 2021-11-24 NOTE — Telephone Encounter (Signed)
Pt called our office. Advised mychart msg had been sent. Aware of results. ?

## 2022-08-06 ENCOUNTER — Other Ambulatory Visit: Payer: Self-pay | Admitting: Internal Medicine

## 2022-08-06 DIAGNOSIS — J4541 Moderate persistent asthma with (acute) exacerbation: Secondary | ICD-10-CM

## 2022-08-06 DIAGNOSIS — R600 Localized edema: Secondary | ICD-10-CM

## 2022-08-17 ENCOUNTER — Ambulatory Visit
Admission: RE | Admit: 2022-08-17 | Discharge: 2022-08-17 | Disposition: A | Discharge status: Home / Self Care | Payer: BC Managed Care – PPO | Source: Ambulatory Visit | Attending: Internal Medicine | Admitting: Internal Medicine

## 2022-08-17 DIAGNOSIS — J4541 Moderate persistent asthma with (acute) exacerbation: Secondary | ICD-10-CM

## 2022-08-17 DIAGNOSIS — R600 Localized edema: Secondary | ICD-10-CM

## 2022-08-20 ENCOUNTER — Other Ambulatory Visit: Payer: BC Managed Care – PPO

## 2022-08-27 ENCOUNTER — Ambulatory Visit
Admission: RE | Admit: 2022-08-27 | Discharge: 2022-08-27 | Disposition: A | Discharge status: Home / Self Care | Payer: BC Managed Care – PPO | Source: Ambulatory Visit | Attending: Internal Medicine | Admitting: Internal Medicine

## 2022-11-03 ENCOUNTER — Other Ambulatory Visit: Payer: Self-pay | Admitting: Obstetrics & Gynecology

## 2022-11-03 DIAGNOSIS — Z1231 Encounter for screening mammogram for malignant neoplasm of breast: Secondary | ICD-10-CM

## 2022-11-19 ENCOUNTER — Ambulatory Visit
Admission: RE | Admit: 2022-11-19 | Discharge: 2022-11-19 | Disposition: A | Discharge status: Home / Self Care | Payer: BC Managed Care – PPO | Source: Ambulatory Visit | Attending: Obstetrics & Gynecology | Admitting: Obstetrics & Gynecology

## 2022-11-19 DIAGNOSIS — Z1231 Encounter for screening mammogram for malignant neoplasm of breast: Secondary | ICD-10-CM | POA: Diagnosis present

## 2023-04-29 ENCOUNTER — Emergency Department: Payer: BC Managed Care – PPO

## 2023-04-29 ENCOUNTER — Other Ambulatory Visit: Payer: Self-pay

## 2023-04-29 ENCOUNTER — Emergency Department
Admission: EM | Admit: 2023-04-29 | Discharge: 2023-04-29 | Disposition: A | Discharge status: Home / Self Care | Payer: BC Managed Care – PPO | Attending: Student in an Organized Health Care Education/Training Program | Admitting: Student in an Organized Health Care Education/Training Program

## 2023-04-29 DIAGNOSIS — Z20822 Contact with and (suspected) exposure to covid-19: Secondary | ICD-10-CM | POA: Diagnosis not present

## 2023-04-29 DIAGNOSIS — J4521 Mild intermittent asthma with (acute) exacerbation: Secondary | ICD-10-CM | POA: Insufficient documentation

## 2023-04-29 DIAGNOSIS — R0602 Shortness of breath: Secondary | ICD-10-CM | POA: Diagnosis present

## 2023-04-29 LAB — CBC WITH DIFFERENTIAL/PLATELET
Abs Immature Granulocytes: 0.02 10*3/uL (ref 0.00–0.07)
Basophils Absolute: 0 10*3/uL (ref 0.0–0.1)
Basophils Relative: 0 %
Eosinophils Absolute: 0 10*3/uL (ref 0.0–0.5)
Eosinophils Relative: 0 %
HCT: 39.2 % (ref 36.0–46.0)
Hemoglobin: 12.6 g/dL (ref 12.0–15.0)
Immature Granulocytes: 0 %
Lymphocytes Relative: 41 %
Lymphs Abs: 4.2 10*3/uL — ABNORMAL HIGH (ref 0.7–4.0)
MCH: 28.1 pg (ref 26.0–34.0)
MCHC: 32.1 g/dL (ref 30.0–36.0)
MCV: 87.5 fL (ref 80.0–100.0)
Monocytes Absolute: 0.4 10*3/uL (ref 0.1–1.0)
Monocytes Relative: 4 %
Neutro Abs: 5.6 10*3/uL (ref 1.7–7.7)
Neutrophils Relative %: 55 %
Platelets: 506 10*3/uL — ABNORMAL HIGH (ref 150–400)
RBC: 4.48 MIL/uL (ref 3.87–5.11)
RDW: 14.3 % (ref 11.5–15.5)
WBC: 10.3 10*3/uL (ref 4.0–10.5)
nRBC: 0 % (ref 0.0–0.2)

## 2023-04-29 LAB — COMPREHENSIVE METABOLIC PANEL
ALT: 11 U/L (ref 0–44)
AST: 18 U/L (ref 15–41)
Albumin: 3.2 g/dL — ABNORMAL LOW (ref 3.5–5.0)
Alkaline Phosphatase: 81 U/L (ref 38–126)
Anion gap: 8 (ref 5–15)
BUN: 9 mg/dL (ref 6–20)
CO2: 24 mmol/L (ref 22–32)
Calcium: 8.4 mg/dL — ABNORMAL LOW (ref 8.9–10.3)
Chloride: 107 mmol/L (ref 98–111)
Creatinine, Ser: 0.62 mg/dL (ref 0.44–1.00)
GFR, Estimated: >60 mL/min (ref >60)
Glucose, Bld: 81 mg/dL (ref 70–99)
Potassium: 3.3 mmol/L — ABNORMAL LOW (ref 3.5–5.1)
Sodium: 139 mmol/L (ref 135–145)
Total Bilirubin: 0.7 mg/dL (ref 0.3–1.2)
Total Protein: 6.6 g/dL (ref 6.5–8.1)

## 2023-04-29 LAB — TROPONIN I (HIGH SENSITIVITY)
Troponin I (High Sensitivity): 4 ng/L (ref <18)
Troponin I (High Sensitivity): 4 ng/L (ref <18)

## 2023-04-29 LAB — SARS CORONAVIRUS 2 BY RT PCR: SARS Coronavirus 2 by RT PCR: NEGATIVE

## 2023-04-29 MED ORDER — IPRATROPIUM-ALBUTEROL 0.5-2.5 (3) MG/3ML IN SOLN
3.0000 mL | Freq: Once | RESPIRATORY_TRACT | Status: AC
  Administered 2023-04-29: 3 mL via RESPIRATORY_TRACT
  Filled 2023-04-29: qty 3

## 2023-04-29 MED ORDER — PREDNISONE 10 MG PO TABS: ORAL_TABLET | ORAL | 0 refills | Status: AC

## 2023-04-29 NOTE — ED Provider Notes (Signed)
[facility] Provider Note    Event Date/Time   First MD Initiated Contact with Patient 04/29/23 1025     (approximate)   History   No chief complaint on file.   HPI  [name] is a 52 y.o. female with a history of asthma presents to the ER for evaluation of shortness of breath started several hours prior to arrival.  Felt generalized chest tightness some shortness of breath.  Denies any pleuritic chest pain no lower extremity swelling.  EMS was called and found patient initially in moderate respiratory distress was given Solu-Medrol as well as nebulizers.  Patient feels like she is improving but still quite winded.  No fevers or chills.     Physical Exam   Triage Vital Signs: ED Triage Vitals  Encounter Vitals Group     BP      Systolic BP Percentile      Diastolic BP Percentile      Pulse      Resp      Temp      Temp src      SpO2      Weight      Height      Head Circumference      Peak Flow      Pain Score      Pain Loc      Pain Education      Exclude from Growth Chart     Most recent vital signs: Vitals:   04/29/23 1300 04/29/23 1330  BP: 116/83 113/81  Pulse: 69 83  Resp: 17 (!) 22  Temp:    SpO2: 99% 97%     Constitutional: Alert  Eyes: Conjunctivae are normal.  Head: Atraumatic. Nose: No congestion/rhinnorhea. Mouth/Throat: Mucous membranes are moist.   Neck: Painless ROM.  Cardiovascular:   Good peripheral circulation. Respiratory: Mild tachypnea with diminished breath sounds bilaterally faint end expiratory wheeze appreciated. Gastrointestinal: Soft and nontender.  Musculoskeletal:  no deformity Neurologic:  MAE spontaneously. No gross focal neurologic deficits are appreciated.  Skin:  Skin is warm, dry and intact. No rash noted. Psychiatric: Mood and affect are normal. Speech and behavior are normal.    ED Results / Procedures / Treatments   Labs (all labs ordered are listed, but only abnormal  results are displayed) Labs Reviewed  CBC WITH DIFFERENTIAL/PLATELET - Abnormal; Notable for the following components:      Result Value   Platelets 506 (*)    Lymphs Abs 4.2 (*)    All other components within normal limits  COMPREHENSIVE METABOLIC PANEL - Abnormal; Notable for the following components:   Potassium 3.3 (*)    Calcium 8.4 (*)    Albumin 3.2 (*)    All other components within normal limits  SARS CORONAVIRUS 2 BY RT PCR  TROPONIN I (HIGH SENSITIVITY)  TROPONIN I (HIGH SENSITIVITY)     EKG  ED ECG REPORT I, Willy Eddy, the attending physician, personally viewed and interpreted this ECG.   Date: 04/29/2023  EKG Time: 15:33  Rate: 70  Rhythm: sinus  Axis: normal  Intervals: normal  ST&T Change: no stemi, no depressions    RADIOLOGY Please see ED Course for my review and interpretation.  I personally reviewed all radiographic images ordered to evaluate for the above acute complaints and reviewed radiology reports and findings.  These findings were personally discussed with the patient.  Please see medical record for radiology report.    PROCEDURES:  Critical Care performed: No  Procedures   MEDICATIONS ORDERED IN ED: Medications  ipratropium-albuterol (DUONEB) 0.5-2.5 (3) MG/3ML nebulizer solution 3 mL (3 mLs Nebulization Given 04/29/23 1051)  ipratropium-albuterol (DUONEB) 0.5-2.5 (3) MG/3ML nebulizer solution 3 mL (3 mLs Nebulization Given 04/29/23 1051)  ipratropium-albuterol (DUONEB) 0.5-2.5 (3) MG/3ML nebulizer solution 3 mL (3 mLs Nebulization Given 04/29/23 1419)     IMPRESSION / MDM / ASSESSMENT AND PLAN / ED COURSE  I reviewed the triage vital signs and the nursing notes.                              Differential diagnosis includes, but is not limited to, Asthma, copd, CHF, pna, ptx, malignancy, Pe, anemia  Patient presenting to the ER for evaluation of symptoms as described above.  Based on symptoms, risk factors and considered  above differential, this presenting complaint could reflect a potentially life-threatening illness therefore the patient will be placed on continuous pulse oximetry and telemetry for monitoring.  Laboratory evaluation will be sent to evaluate for the above complaints.  Patient arrives with nebulizer ongoing with improvement in symptoms.  Her exam is most consistent with asthma, bronchitis.  Will give additional nebulizer treatment she is already received Solu-Medrol.  She is protecting her airway.   Clinical Course as of 04/29/23 1535  Thu Apr 29, 2023  1104 Chest x-ray on my review and interpretation without evidence of pneumothorax. [PR]  1352 Patient able to ambulate with steady gait no hypoxia.  No tachycardia. [PR]  1511 Patient reassessed.  Patient has had significant improvement in breathing pattern.  Still has some wheezing but much better air movement overall.  Her vital signs are stable.  This is not consistent with PE.  Patient does have nebulizers and breathing treatments at home.  Responds well to prednisone taper.  No sign of sepsis.  I do believe she stable and appropriate for outpatient follow-up pending repeat negative troponin. [PR]    Clinical Course User Index [PR] Willy Eddy, MD     FINAL CLINICAL IMPRESSION(S) / ED DIAGNOSES   Final diagnoses:  Mild intermittent asthma with exacerbation     Rx / DC Orders   ED Discharge Orders          Ordered    predniSONE (DELTASONE) 10 MG tablet        04/29/23 1516             Note:  This document was prepared using Dragon voice recognition software and may include unintentional dictation errors.    Willy Eddy, MD 04/29/23 1535

## 2023-04-29 NOTE — ED Notes (Signed)
Pt A&O x4, no obvious distress noted, respirations regular/unlabored. Pt verbalizes understanding of discharge instructions. Pt able to ambulate from ED independently.   

## 2023-04-29 NOTE — Discharge Instructions (Signed)
Your troponin, heart enzyme, was perfectly normal.  There is no sign of damage or strain on your heart.  As we discussed I have sent a prescription for prednisone to your pharmacy.  Please continue to take your nebulizers and inhalers.  Please return immediately if you have any worsening shortness of breath questions or concerns.

## 2023-04-29 NOTE — ED Triage Notes (Signed)
BIB [facility] from home. Presents with wheezing to EMS 87% on RA. Hx of asthma. EMS gave 125mg  solumedrol IV,  2 albuterol treatments. Pt c/o chest tightness that started at 0500 this morning.

## 2023-07-01 ENCOUNTER — Other Ambulatory Visit: Payer: Self-pay | Admitting: Podiatry

## 2023-07-01 DIAGNOSIS — S93421A Sprain of deltoid ligament of right ankle, initial encounter: Secondary | ICD-10-CM

## 2023-07-01 DIAGNOSIS — M76821 Posterior tibial tendinitis, right leg: Secondary | ICD-10-CM

## 2023-07-19 ENCOUNTER — Encounter: Payer: Self-pay | Admitting: Podiatry

## 2023-07-23 ENCOUNTER — Ambulatory Visit
Admission: RE | Admit: 2023-07-23 | Discharge: 2023-07-23 | Disposition: A | Discharge status: Home / Self Care | Payer: BC Managed Care – PPO | Source: Ambulatory Visit | Attending: Podiatry | Admitting: Podiatry

## 2023-07-23 ENCOUNTER — Encounter: Payer: Self-pay | Admitting: Vascular Surgery

## 2023-07-23 DIAGNOSIS — S93421A Sprain of deltoid ligament of right ankle, initial encounter: Secondary | ICD-10-CM

## 2023-07-23 DIAGNOSIS — M76821 Posterior tibial tendinitis, right leg: Secondary | ICD-10-CM

## 2023-09-03 ENCOUNTER — Other Ambulatory Visit: Payer: Self-pay

## 2023-09-03 ENCOUNTER — Encounter: Payer: Self-pay | Admitting: Medical Oncology

## 2023-09-03 ENCOUNTER — Emergency Department
Admission: EM | Admit: 2023-09-03 | Discharge: 2023-09-03 | Disposition: A | Discharge status: Home / Self Care | Payer: BC Managed Care – PPO | Attending: Emergency Medicine | Admitting: Emergency Medicine

## 2023-09-03 ENCOUNTER — Emergency Department: Payer: BC Managed Care – PPO

## 2023-09-03 DIAGNOSIS — R110 Nausea: Secondary | ICD-10-CM | POA: Diagnosis not present

## 2023-09-03 DIAGNOSIS — R109 Unspecified abdominal pain: Secondary | ICD-10-CM | POA: Insufficient documentation

## 2023-09-03 LAB — COMPREHENSIVE METABOLIC PANEL
ALT: 12 U/L (ref 0–44)
AST: 18 U/L (ref 15–41)
Albumin: 3.5 g/dL (ref 3.5–5.0)
Alkaline Phosphatase: 80 U/L (ref 38–126)
Anion gap: 14 (ref 5–15)
BUN: 12 mg/dL (ref 6–20)
CO2: 23 mmol/L (ref 22–32)
Calcium: 9.3 mg/dL (ref 8.9–10.3)
Chloride: 103 mmol/L (ref 98–111)
Creatinine, Ser: 0.7 mg/dL (ref 0.44–1.00)
GFR, Estimated: >60 mL/min (ref >60)
Glucose, Bld: 74 mg/dL (ref 70–99)
Potassium: 4.1 mmol/L (ref 3.5–5.1)
Sodium: 140 mmol/L (ref 135–145)
Total Bilirubin: 0.5 mg/dL (ref 0.0–1.2)
Total Protein: 7 g/dL (ref 6.5–8.1)

## 2023-09-03 LAB — LIPASE, BLOOD: Lipase: 38 U/L (ref 11–51)

## 2023-09-03 LAB — CBC
HCT: 42 % (ref 36.0–46.0)
Hemoglobin: 14 g/dL (ref 12.0–15.0)
MCH: 29.4 pg (ref 26.0–34.0)
MCHC: 33.3 g/dL (ref 30.0–36.0)
MCV: 88.1 fL (ref 80.0–100.0)
Platelets: 413 10*3/uL — ABNORMAL HIGH (ref 150–400)
RBC: 4.77 MIL/uL (ref 3.87–5.11)
RDW: 13.9 % (ref 11.5–15.5)
WBC: 9.7 10*3/uL (ref 4.0–10.5)
nRBC: 0 % (ref 0.0–0.2)

## 2023-09-03 LAB — URINALYSIS, ROUTINE W REFLEX MICROSCOPIC
Bilirubin Urine: NEGATIVE
Glucose, UA: NEGATIVE mg/dL
Hgb urine dipstick: NEGATIVE
Ketones, ur: NEGATIVE mg/dL
Leukocytes,Ua: NEGATIVE
Nitrite: NEGATIVE
Protein, ur: NEGATIVE mg/dL
Specific Gravity, Urine: 1.021 (ref 1.005–1.030)
pH: 5 (ref 5.0–8.0)

## 2023-09-03 MED ORDER — HYDROCODONE-ACETAMINOPHEN 5-325 MG PO TABS: 1.0000 | ORAL_TABLET | Freq: Four times a day (QID) | ORAL | 0 refills | Status: AC | PRN

## 2023-09-03 MED ORDER — ONDANSETRON 4 MG PO TBDP
4.0000 mg | ORAL_TABLET | Freq: Once | ORAL | Status: AC
  Administered 2023-09-03: 4 mg via ORAL
  Filled 2023-09-03: qty 1

## 2023-09-03 MED ORDER — CYCLOBENZAPRINE HCL 10 MG PO TABS: 10.0000 mg | ORAL_TABLET | Freq: Three times a day (TID) | ORAL | 0 refills | Status: AC | PRN

## 2023-09-03 MED ORDER — ONDANSETRON HCL 4 MG PO TABS: 4.0000 mg | ORAL_TABLET | Freq: Four times a day (QID) | ORAL | 0 refills | Status: AC | PRN

## 2023-09-03 MED ORDER — ONDANSETRON HCL 4 MG/2ML IJ SOLN: 4.0000 mg | Freq: Once | INTRAMUSCULAR | Status: DC

## 2023-09-03 MED ORDER — OXYCODONE-ACETAMINOPHEN 5-325 MG PO TABS
1.0000 | ORAL_TABLET | Freq: Once | ORAL | Status: AC
  Administered 2023-09-03: 1 via ORAL
  Filled 2023-09-03: qty 1

## 2023-09-03 NOTE — Discharge Instructions (Signed)
You were seen in the ER today for your flank pain.  I suspect this is likely related to a muscle strain.  You can continue to use Tylenol and ibuprofen to help with your pain.  I have also sent a prescription for muscle relaxer to your pharmacy.  If you have severe breakthrough pain, I sent a short course of narcotic medicine.  Do not drive or operate machinery when taking either of these medicines.  I have also sent a prescription for nausea medicine that you can take as needed.  Follow with your primary care doctor for further evaluation.  Return to the ER for any new or worsening symptoms.

## 2023-09-03 NOTE — ED Triage Notes (Signed)
Pt reports left sided upper abd pain/flank pain that began Tuesday. Denies sob. Pain with movement.

## 2023-09-03 NOTE — ED Provider Notes (Signed)
[facility] Provider Note    Event Date/Time   First MD Initiated Contact with Patient 09/03/23 1105     (approximate)   History   Abdominal Pain   HPI  [name] is a 53 year old female presenting to the emergency department for evaluation of flank pain.  Symptoms started on Tuesday.  Has had some nausea without vomiting.  No chest pain or shortness of breath.  Pain is worse with movement.  Has been trialing Tylenol and ibuprofen with limited benefit.  No recent trauma or heavy lifting.  Denies history of similar symptoms.     Physical Exam   Triage Vital Signs: ED Triage Vitals  Encounter Vitals Group     BP 09/03/23 1028 122/81     Systolic BP Percentile --      Diastolic BP Percentile --      Pulse Rate 09/03/23 1028 77     Resp 09/03/23 1028 17     Temp 09/03/23 1028 98.6 F (37 C)     Temp Source 09/03/23 1028 Oral     SpO2 09/03/23 1028 99 %     Weight 09/03/23 1025 252 lb (114.3 kg)     Height 09/03/23 1025 5\' 1"  (1.549 m)     Head Circumference --      Peak Flow --      Pain Score 09/03/23 1025 10     Pain Loc --      Pain Education --      Exclude from Growth Chart --     Most recent vital signs: Vitals:   09/03/23 1028  BP: 122/81  Pulse: 77  Resp: 17  Temp: 98.6 F (37 C)  SpO2: 99%     General: Awake, interactive  CV:  Regular rate, good peripheral perfusion.  Resp:  Unlabored respirations, lungs clear to auscultation Abd:  Nondistended, soft, mild tenderness palpation over the left side of the abdomen, but primarily extending into the left flank area both reproducible areas of tenderness.  No overlying skin changes. Neuro:  Symmetric facial movement, fluid speech   ED Results / Procedures / Treatments   Labs (all labs ordered are listed, but only abnormal results are displayed) Labs Reviewed  CBC - Abnormal; Notable for the following components:      Result Value   Platelets 413 (*)    All other  components within normal limits  URINALYSIS, ROUTINE W REFLEX MICROSCOPIC - Abnormal; Notable for the following components:   Color, Urine YELLOW (*)    APPearance HAZY (*)    All other components within normal limits  LIPASE, BLOOD  COMPREHENSIVE METABOLIC PANEL     EKG EKG independently reviewed interpreted by myself (ER attending) demonstrates:  EKG demonstrates normal sinus rhythm rate of 72, PR 172, QRS 76, QTc 405, no acute ST changes  RADIOLOGY Imaging independently reviewed and interpreted by myself demonstrates:  CT abdomen pelvis without evidence of renal stone or hydronephrosis.  No other acute findings noted.  PROCEDURES:  Critical Care performed: No  Procedures   MEDICATIONS ORDERED IN ED: Medications  oxyCODONE-acetaminophen (PERCOCET/ROXICET) 5-325 MG per tablet 1 tablet (1 tablet Oral Given 09/03/23 1206)  ondansetron (ZOFRAN-ODT) disintegrating tablet 4 mg (4 mg Oral Given 09/03/23 1206)     IMPRESSION / MDM / ASSESSMENT AND PLAN / ED COURSE  I reviewed the triage vital signs and the nursing notes.  Differential diagnosis includes, but is not limited to, musculoskeletal strain, renal stone, pyelonephritis,  other acute intra-abdominal process  Patient's presentation is most consistent with acute presentation with potential threat to life or bodily function.  53 year old female presenting to the emergency department for evaluation of left flank pain.  Labs and urine from triage are reassuring.  CT without acute findings.  Treated symptomatically with Zofran and Percocet.  Patient reevaluated and feels much improved.  Updated on results of workup.  History seems most consistent with musculoskeletal strain given reproducible tenderness to palpation worse with movements.  Discussed supportive care.  Will DC with short course of muscle relaxer and pain medication.  Strict return precaution provided.  Patient discharged in stable condition.      FINAL CLINICAL  IMPRESSION(S) / ED DIAGNOSES   Final diagnoses:  Acute left flank pain     Rx / DC Orders   ED Discharge Orders          Ordered    ondansetron (ZOFRAN) 4 MG tablet  Every 6 hours PRN        09/03/23 1356    HYDROcodone-acetaminophen (NORCO) 5-325 MG tablet  Every 6 hours PRN        09/03/23 1356    cyclobenzaprine (FLEXERIL) 10 MG tablet  3 times daily PRN        09/03/23 1356             Note:  This document was prepared using Dragon voice recognition software and may include unintentional dictation errors.   Trinna Post, MD 09/03/23 1356

## 2023-10-06 ENCOUNTER — Other Ambulatory Visit: Payer: Self-pay | Admitting: Internal Medicine

## 2023-10-06 DIAGNOSIS — M25421 Effusion, right elbow: Secondary | ICD-10-CM

## 2023-10-07 ENCOUNTER — Ambulatory Visit
Admission: RE | Admit: 2023-10-07 | Discharge: 2023-10-07 | Disposition: A | Discharge status: Home / Self Care | Payer: BC Managed Care – PPO | Source: Ambulatory Visit | Attending: Internal Medicine | Admitting: Internal Medicine

## 2023-10-07 DIAGNOSIS — M25421 Effusion, right elbow: Secondary | ICD-10-CM

## 2023-10-25 ENCOUNTER — Other Ambulatory Visit: Payer: Self-pay | Admitting: Obstetrics & Gynecology

## 2023-10-25 DIAGNOSIS — Z1231 Encounter for screening mammogram for malignant neoplasm of breast: Secondary | ICD-10-CM

## 2023-11-25 ENCOUNTER — Ambulatory Visit
Admission: RE | Admit: 2023-11-25 | Discharge: 2023-11-25 | Disposition: A | Discharge status: Home / Self Care | Source: Ambulatory Visit | Attending: Obstetrics & Gynecology | Admitting: Obstetrics & Gynecology

## 2023-11-25 DIAGNOSIS — Z1231 Encounter for screening mammogram for malignant neoplasm of breast: Secondary | ICD-10-CM | POA: Insufficient documentation

## 2024-02-22 ENCOUNTER — Other Ambulatory Visit: Payer: Self-pay | Admitting: Internal Medicine

## 2024-02-22 DIAGNOSIS — J329 Chronic sinusitis, unspecified: Secondary | ICD-10-CM

## 2024-02-28 ENCOUNTER — Other Ambulatory Visit: Payer: Self-pay

## 2024-02-28 ENCOUNTER — Emergency Department
Admission: EM | Admit: 2024-02-28 | Discharge: 2024-02-28 | Disposition: A | Discharge status: Home / Self Care | Payer: Self-pay | Attending: Emergency Medicine | Admitting: Emergency Medicine

## 2024-02-28 ENCOUNTER — Emergency Department: Payer: Self-pay

## 2024-02-28 DIAGNOSIS — J45909 Unspecified asthma, uncomplicated: Secondary | ICD-10-CM | POA: Diagnosis not present

## 2024-02-28 DIAGNOSIS — Y92410 Unspecified street and highway as the place of occurrence of the external cause: Secondary | ICD-10-CM | POA: Diagnosis not present

## 2024-02-28 DIAGNOSIS — M545 Low back pain, unspecified: Secondary | ICD-10-CM | POA: Insufficient documentation

## 2024-02-28 DIAGNOSIS — M7918 Myalgia, other site: Secondary | ICD-10-CM
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
## 2024-02-28 NOTE — Discharge Instructions (Signed)

## 2024-03-07 ENCOUNTER — Other Ambulatory Visit: Payer: Self-pay | Admitting: Physical Medicine & Rehabilitation

## 2024-03-07 DIAGNOSIS — G8929 Other chronic pain: Secondary | ICD-10-CM

## 2024-03-09 ENCOUNTER — Ambulatory Visit
Admission: RE | Admit: 2024-03-09 | Discharge: 2024-03-09 | Disposition: A | Discharge status: Home / Self Care | Source: Ambulatory Visit | Attending: Internal Medicine | Admitting: Internal Medicine

## 2024-03-09 DIAGNOSIS — J329 Chronic sinusitis, unspecified: Secondary | ICD-10-CM | POA: Insufficient documentation

## 2024-03-09 DIAGNOSIS — J40 Bronchitis, not specified as acute or chronic: Secondary | ICD-10-CM | POA: Insufficient documentation

## 2024-03-11 ENCOUNTER — Ambulatory Visit
Admission: RE | Admit: 2024-03-11 | Discharge: 2024-03-11 | Disposition: A | Discharge status: Home / Self Care | Source: Ambulatory Visit | Attending: Physical Medicine & Rehabilitation | Admitting: Physical Medicine & Rehabilitation

## 2024-03-11 DIAGNOSIS — G8929 Other chronic pain: Secondary | ICD-10-CM

## 2024-05-29 ENCOUNTER — Other Ambulatory Visit: Payer: Self-pay | Admitting: Internal Medicine

## 2024-05-29 DIAGNOSIS — R1084 Generalized abdominal pain: Secondary | ICD-10-CM

## 2024-05-31 ENCOUNTER — Ambulatory Visit
Admission: RE | Admit: 2024-05-31 | Discharge: 2024-05-31 | Disposition: A | Discharge status: Home / Self Care | Source: Ambulatory Visit | Attending: Internal Medicine | Admitting: Internal Medicine

## 2024-05-31 DIAGNOSIS — R1084 Generalized abdominal pain: Secondary | ICD-10-CM | POA: Insufficient documentation
